# Patient Record
Sex: Male | Born: 1935 | Race: White | Hispanic: No | Smoking: Former smoker
Health system: Southern US, Community
[De-identification: ages and names within clinical notes are randomized; demographics above are authoritative.]

## PROBLEM LIST (undated history)

## (undated) DIAGNOSIS — F329 Major depressive disorder, single episode, unspecified: Secondary | ICD-10-CM

## (undated) DIAGNOSIS — I4719 Other supraventricular tachycardia: Secondary | ICD-10-CM

## (undated) DIAGNOSIS — I2510 Atherosclerotic heart disease of native coronary artery without angina pectoris: Secondary | ICD-10-CM

## (undated) DIAGNOSIS — K648 Other hemorrhoids: Secondary | ICD-10-CM

## (undated) DIAGNOSIS — E7800 Pure hypercholesterolemia, unspecified: Secondary | ICD-10-CM

## (undated) DIAGNOSIS — K529 Noninfective gastroenteritis and colitis, unspecified: Secondary | ICD-10-CM

## (undated) DIAGNOSIS — I471 Supraventricular tachycardia: Secondary | ICD-10-CM

## (undated) DIAGNOSIS — C61 Malignant neoplasm of prostate: Secondary | ICD-10-CM

## (undated) DIAGNOSIS — N183 Chronic kidney disease, stage 3 unspecified: Secondary | ICD-10-CM

## (undated) DIAGNOSIS — K219 Gastro-esophageal reflux disease without esophagitis: Secondary | ICD-10-CM

## (undated) DIAGNOSIS — I10 Essential (primary) hypertension: Secondary | ICD-10-CM

## (undated) DIAGNOSIS — F32A Depression, unspecified: Secondary | ICD-10-CM

## (undated) DIAGNOSIS — R2681 Unsteadiness on feet: Secondary | ICD-10-CM

## (undated) DIAGNOSIS — I4891 Unspecified atrial fibrillation: Secondary | ICD-10-CM

## (undated) DIAGNOSIS — M549 Dorsalgia, unspecified: Secondary | ICD-10-CM

## (undated) DIAGNOSIS — M1990 Unspecified osteoarthritis, unspecified site: Secondary | ICD-10-CM

## (undated) DIAGNOSIS — I219 Acute myocardial infarction, unspecified: Secondary | ICD-10-CM

## (undated) DIAGNOSIS — D126 Benign neoplasm of colon, unspecified: Secondary | ICD-10-CM

## (undated) DIAGNOSIS — K5730 Diverticulosis of large intestine without perforation or abscess without bleeding: Secondary | ICD-10-CM

## (undated) DIAGNOSIS — I509 Heart failure, unspecified: Secondary | ICD-10-CM

## (undated) DIAGNOSIS — R001 Bradycardia, unspecified: Secondary | ICD-10-CM

## (undated) HISTORY — PX: OTHER SURGICAL HISTORY: SHX169

## (undated) HISTORY — DX: Other hemorrhoids: K64.8

## (undated) HISTORY — PX: CARDIAC VALVE SURGERY: SHX40

## (undated) HISTORY — DX: Dorsalgia, unspecified: M54.9

## (undated) HISTORY — DX: Unspecified atrial fibrillation: I48.91

## (undated) HISTORY — DX: Malignant neoplasm of prostate: C61

## (undated) HISTORY — DX: Heart failure, unspecified: I50.9

## (undated) HISTORY — DX: Pure hypercholesterolemia, unspecified: E78.00

## (undated) HISTORY — DX: Bradycardia, unspecified: R00.1

## (undated) HISTORY — DX: Unspecified osteoarthritis, unspecified site: M19.90

## (undated) HISTORY — DX: Atherosclerotic heart disease of native coronary artery without angina pectoris: I25.10

## (undated) HISTORY — DX: Acute myocardial infarction, unspecified: I21.9

## (undated) HISTORY — DX: Diverticulosis of large intestine without perforation or abscess without bleeding: K57.30

## (undated) HISTORY — DX: Essential (primary) hypertension: I10

---

## 1988-09-23 HISTORY — PX: CORONARY ARTERY BYPASS GRAFT: SHX141

## 1992-01-22 HISTORY — PX: PROSTATE SURGERY: SHX751

## 1998-02-28 ENCOUNTER — Ambulatory Visit (HOSPITAL_BASED_OUTPATIENT_CLINIC_OR_DEPARTMENT_OTHER): Admission: RE | Admit: 1998-02-28 | Discharge: 1998-02-28 | Payer: Self-pay | Admitting: Orthopedic Surgery

## 1999-02-12 ENCOUNTER — Emergency Department (HOSPITAL_COMMUNITY): Admission: EM | Admit: 1999-02-12 | Discharge: 1999-02-12 | Payer: Self-pay | Admitting: Emergency Medicine

## 1999-05-15 ENCOUNTER — Encounter: Payer: Self-pay | Admitting: Emergency Medicine

## 1999-05-15 ENCOUNTER — Emergency Department (HOSPITAL_COMMUNITY): Admission: EM | Admit: 1999-05-15 | Discharge: 1999-05-15 | Payer: Self-pay | Admitting: Emergency Medicine

## 2000-07-21 ENCOUNTER — Emergency Department (HOSPITAL_COMMUNITY): Admission: EM | Admit: 2000-07-21 | Discharge: 2000-07-22 | Payer: Self-pay | Admitting: Emergency Medicine

## 2001-01-15 ENCOUNTER — Encounter: Payer: Self-pay | Admitting: Orthopedic Surgery

## 2001-01-15 ENCOUNTER — Encounter: Admission: RE | Admit: 2001-01-15 | Discharge: 2001-01-15 | Payer: Self-pay | Admitting: Orthopedic Surgery

## 2002-11-16 ENCOUNTER — Inpatient Hospital Stay (HOSPITAL_COMMUNITY): Admission: EM | Admit: 2002-11-16 | Discharge: 2002-11-18 | Payer: Self-pay | Admitting: Emergency Medicine

## 2002-11-16 ENCOUNTER — Encounter: Payer: Self-pay | Admitting: Emergency Medicine

## 2004-01-17 ENCOUNTER — Encounter: Admission: RE | Admit: 2004-01-17 | Discharge: 2004-01-17 | Payer: Self-pay | Admitting: Family Medicine

## 2004-02-13 ENCOUNTER — Ambulatory Visit (HOSPITAL_COMMUNITY): Admission: RE | Admit: 2004-02-13 | Discharge: 2004-02-13 | Payer: Self-pay | Admitting: Cardiology

## 2004-07-26 ENCOUNTER — Ambulatory Visit: Payer: Self-pay | Admitting: Cardiology

## 2005-01-09 ENCOUNTER — Ambulatory Visit: Payer: Self-pay | Admitting: Cardiology

## 2005-04-20 ENCOUNTER — Encounter: Admission: RE | Admit: 2005-04-20 | Discharge: 2005-04-20 | Payer: Self-pay | Admitting: Family Medicine

## 2005-05-01 ENCOUNTER — Encounter: Admission: RE | Admit: 2005-05-01 | Discharge: 2005-05-01 | Payer: Self-pay | Admitting: Family Medicine

## 2005-05-15 ENCOUNTER — Encounter: Admission: RE | Admit: 2005-05-15 | Discharge: 2005-05-15 | Payer: Self-pay | Admitting: Family Medicine

## 2005-08-12 ENCOUNTER — Ambulatory Visit: Payer: Self-pay | Admitting: Cardiology

## 2005-08-23 ENCOUNTER — Ambulatory Visit: Payer: Self-pay

## 2005-12-05 ENCOUNTER — Ambulatory Visit: Payer: Self-pay | Admitting: Cardiology

## 2006-05-29 ENCOUNTER — Ambulatory Visit: Payer: Self-pay | Admitting: Cardiology

## 2006-07-18 ENCOUNTER — Ambulatory Visit (HOSPITAL_COMMUNITY): Admission: RE | Admit: 2006-07-18 | Discharge: 2006-07-18 | Payer: Self-pay | Admitting: Urology

## 2006-08-04 ENCOUNTER — Ambulatory Visit: Payer: Self-pay | Admitting: Cardiology

## 2006-09-23 HISTORY — PX: CORONARY ANGIOPLASTY WITH STENT PLACEMENT: SHX49

## 2006-12-11 ENCOUNTER — Ambulatory Visit: Payer: Self-pay | Admitting: Cardiology

## 2007-02-09 ENCOUNTER — Encounter: Admission: RE | Admit: 2007-02-09 | Discharge: 2007-02-09 | Payer: Self-pay | Admitting: Family Medicine

## 2007-02-22 DIAGNOSIS — D126 Benign neoplasm of colon, unspecified: Secondary | ICD-10-CM

## 2007-02-22 HISTORY — DX: Benign neoplasm of colon, unspecified: D12.6

## 2007-02-23 ENCOUNTER — Ambulatory Visit: Payer: Self-pay | Admitting: Gastroenterology

## 2007-03-03 ENCOUNTER — Encounter: Payer: Self-pay | Admitting: Gastroenterology

## 2007-03-03 ENCOUNTER — Ambulatory Visit: Payer: Self-pay | Admitting: Gastroenterology

## 2007-03-03 LAB — CONVERTED CEMR LAB: BUN: 24 mg/dL — ABNORMAL HIGH (ref 6–23)

## 2007-03-04 ENCOUNTER — Encounter: Admission: RE | Admit: 2007-03-04 | Discharge: 2007-03-04 | Payer: Self-pay | Admitting: Gastroenterology

## 2007-03-23 ENCOUNTER — Encounter: Admission: RE | Admit: 2007-03-23 | Discharge: 2007-03-23 | Payer: Self-pay | Admitting: Family Medicine

## 2007-04-01 ENCOUNTER — Ambulatory Visit: Payer: Self-pay | Admitting: Gastroenterology

## 2007-05-26 ENCOUNTER — Ambulatory Visit: Payer: Self-pay | Admitting: Cardiology

## 2007-06-02 ENCOUNTER — Inpatient Hospital Stay (HOSPITAL_COMMUNITY): Admission: EM | Admit: 2007-06-02 | Discharge: 2007-06-12 | Payer: Self-pay | Admitting: Emergency Medicine

## 2007-06-02 ENCOUNTER — Ambulatory Visit: Payer: Self-pay | Admitting: Cardiology

## 2007-06-04 ENCOUNTER — Ambulatory Visit: Payer: Self-pay | Admitting: Cardiothoracic Surgery

## 2007-06-09 ENCOUNTER — Encounter: Payer: Self-pay | Admitting: Cardiology

## 2007-06-18 ENCOUNTER — Ambulatory Visit: Payer: Self-pay | Admitting: Cardiology

## 2007-06-18 LAB — CONVERTED CEMR LAB
CO2: 29 meq/L (ref 19–32)
Calcium: 9 mg/dL (ref 8.4–10.5)
Chloride: 102 meq/L (ref 96–112)
GFR calc Af Amer: 54 mL/min
Glucose, Bld: 84 mg/dL (ref 70–99)
Sodium: 138 meq/L (ref 135–145)

## 2007-07-02 ENCOUNTER — Encounter (HOSPITAL_COMMUNITY): Admission: RE | Admit: 2007-07-02 | Discharge: 2007-09-23 | Payer: Self-pay | Admitting: Cardiology

## 2007-07-06 ENCOUNTER — Inpatient Hospital Stay (HOSPITAL_COMMUNITY): Admission: EM | Admit: 2007-07-06 | Discharge: 2007-07-08 | Payer: Self-pay | Admitting: Emergency Medicine

## 2007-07-06 ENCOUNTER — Ambulatory Visit: Payer: Self-pay | Admitting: Cardiology

## 2007-07-13 ENCOUNTER — Ambulatory Visit: Payer: Self-pay | Admitting: Cardiology

## 2007-07-13 ENCOUNTER — Encounter: Payer: Self-pay | Admitting: Cardiology

## 2007-07-13 ENCOUNTER — Ambulatory Visit: Payer: Self-pay

## 2007-08-13 ENCOUNTER — Ambulatory Visit: Payer: Self-pay | Admitting: Cardiology

## 2007-08-26 ENCOUNTER — Ambulatory Visit: Payer: Self-pay | Admitting: Cardiology

## 2007-08-26 LAB — CONVERTED CEMR LAB
Chloride: 105 meq/L (ref 96–112)
Creatinine, Ser: 1.4 mg/dL (ref 0.4–1.5)
Glucose, Bld: 65 mg/dL — ABNORMAL LOW (ref 70–99)
Sodium: 142 meq/L (ref 135–145)

## 2007-09-24 ENCOUNTER — Encounter (HOSPITAL_COMMUNITY): Admission: RE | Admit: 2007-09-24 | Discharge: 2007-11-06 | Payer: Self-pay | Admitting: Cardiology

## 2007-09-25 ENCOUNTER — Ambulatory Visit: Payer: Self-pay | Admitting: Cardiology

## 2007-11-25 ENCOUNTER — Ambulatory Visit: Payer: Self-pay | Admitting: Cardiology

## 2008-01-12 ENCOUNTER — Ambulatory Visit: Payer: Self-pay | Admitting: Cardiology

## 2008-05-26 ENCOUNTER — Ambulatory Visit: Payer: Self-pay | Admitting: Cardiology

## 2008-06-09 ENCOUNTER — Ambulatory Visit: Payer: Self-pay | Admitting: Cardiology

## 2008-06-09 LAB — CONVERTED CEMR LAB
ALT: 17 units/L (ref 0–53)
AST: 23 units/L (ref 0–37)
Albumin: 3.6 g/dL (ref 3.5–5.2)
BUN: 18 mg/dL (ref 6–23)
Basophils Relative: 0.5 % (ref 0.0–3.0)
CO2: 29 meq/L (ref 19–32)
Chloride: 105 meq/L (ref 96–112)
Creatinine, Ser: 1.4 mg/dL (ref 0.4–1.5)
Eosinophils Relative: 4.3 % (ref 0.0–5.0)
Glucose, Bld: 122 mg/dL — ABNORMAL HIGH (ref 70–99)
LDL Cholesterol: 60 mg/dL (ref 0–99)
Lymphocytes Relative: 26.4 % (ref 12.0–46.0)
Magnesium: 2.4 mg/dL (ref 1.5–2.5)
Monocytes Relative: 7.2 % (ref 3.0–12.0)
Neutrophils Relative %: 61.6 % (ref 43.0–77.0)
RBC: 5.23 M/uL (ref 4.22–5.81)
Total Bilirubin: 0.9 mg/dL (ref 0.3–1.2)
VLDL: 19 mg/dL (ref 0–40)
WBC: 9 10*3/uL (ref 4.5–10.5)

## 2008-06-17 ENCOUNTER — Ambulatory Visit: Payer: Self-pay | Admitting: Cardiology

## 2008-06-17 LAB — CONVERTED CEMR LAB
CO2: 28 meq/L (ref 19–32)
Calcium: 8.6 mg/dL (ref 8.4–10.5)
Creatinine, Ser: 1.3 mg/dL (ref 0.4–1.5)

## 2008-08-10 IMAGING — CR DG CHEST 2V
2 series · 2 of 2 positions shown · non-contrast
Comparison: 06/06/07.

CLINICAL DATA: Acute myocardial infarction.  Chest pain and shortness of breath.
 CHEST - 2 VIEW:

[w chest pa]
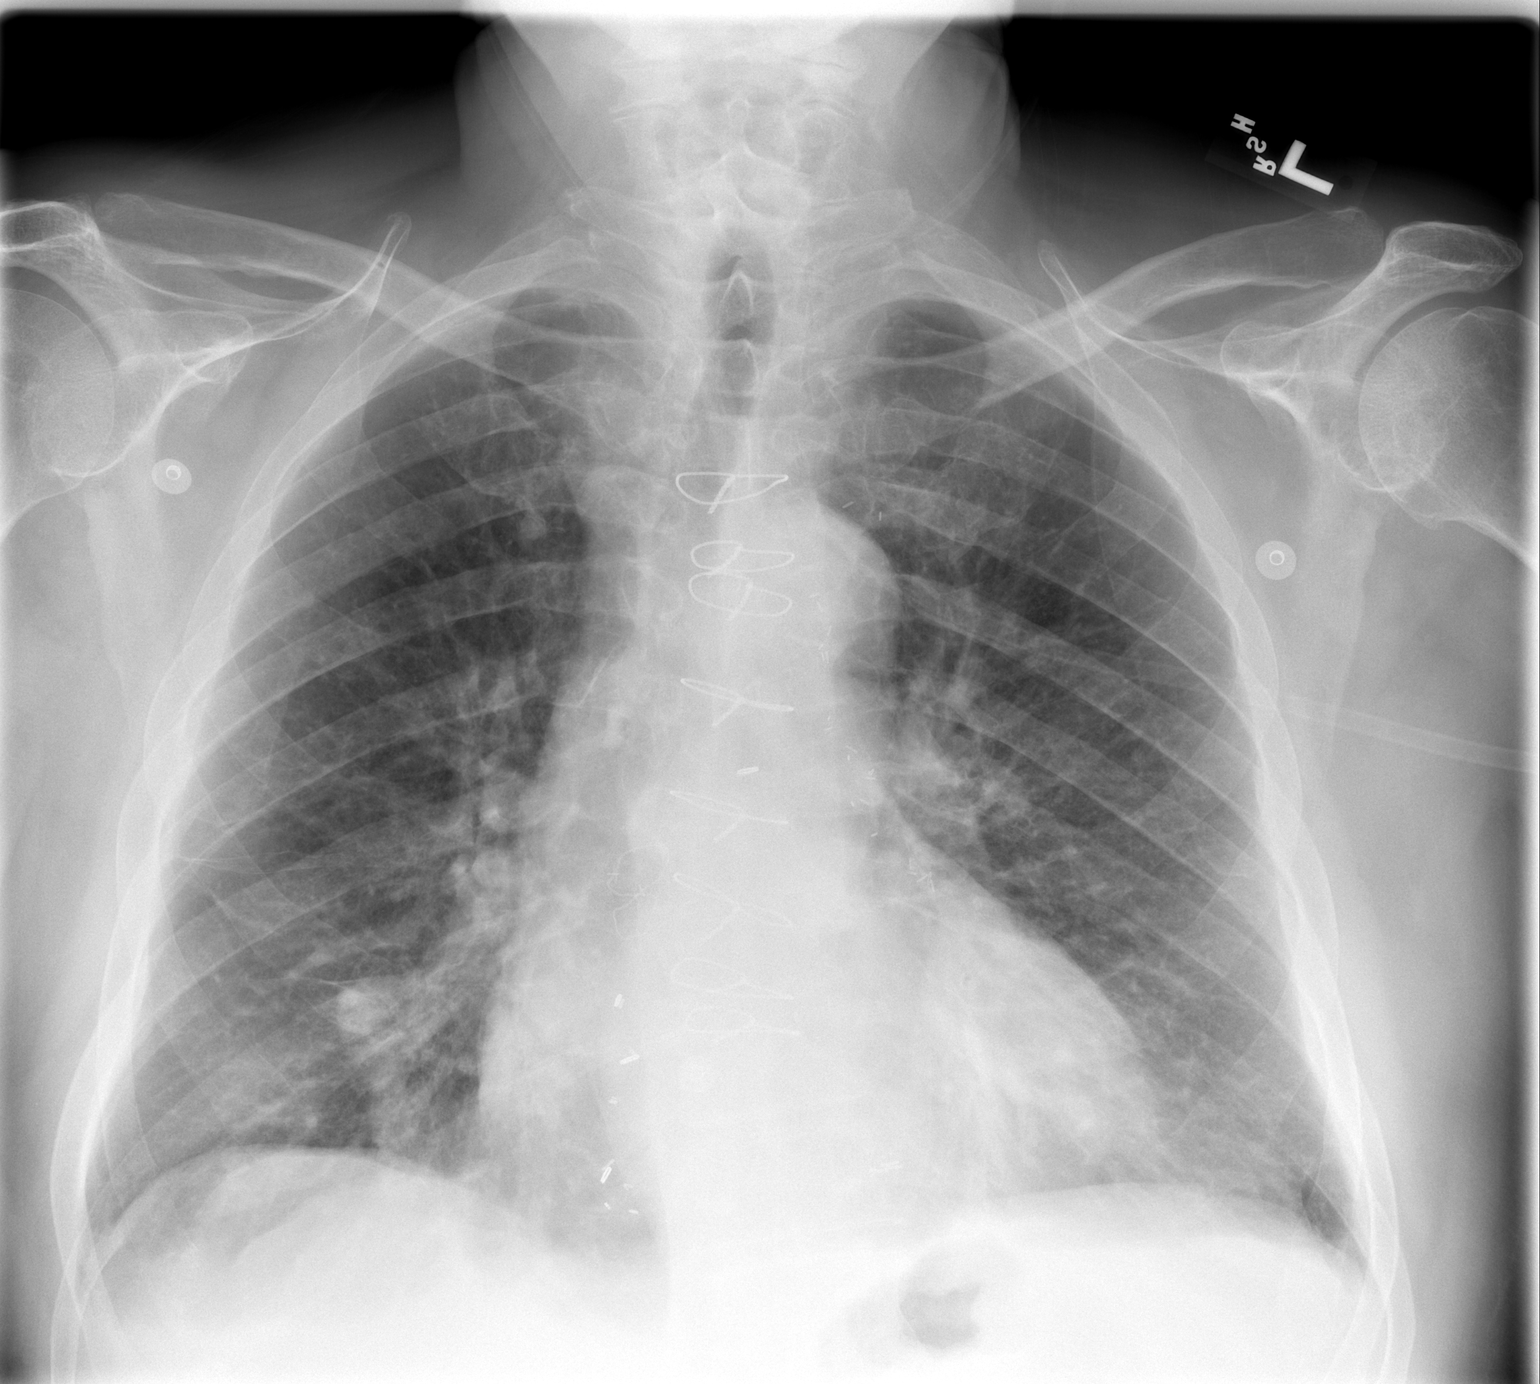

[w chest lat]
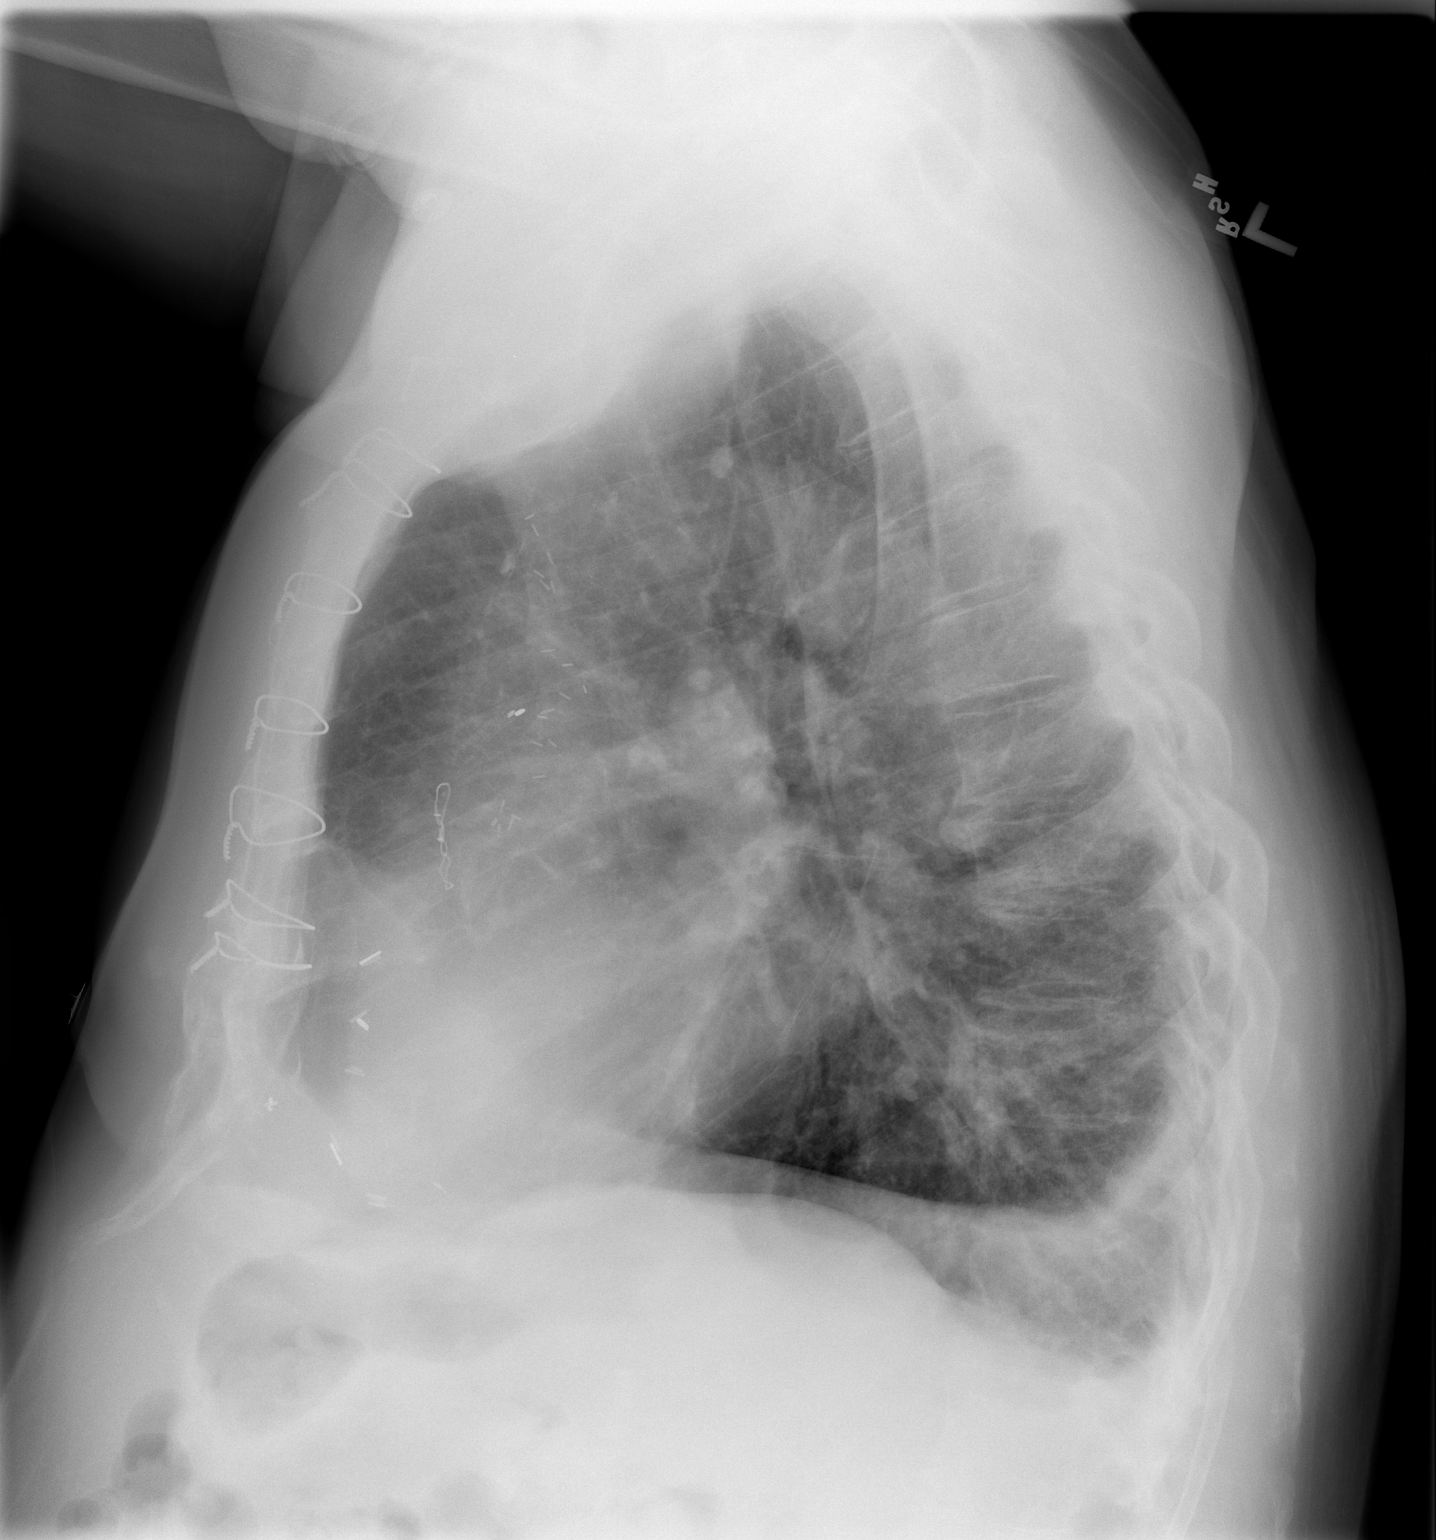

[2 of 2 positions shown; findings below may reference images not displayed]

FINDINGS: Tiny bilateral pleural effusions remain stable.  Mild cardiomegaly is stable as well as mild interstitial edema.  There is no evidence of pulmonary consolidation.  Patient has undergone a previous coronary artery bypass grafting.
IMPRESSION: Stable mild congestive heart failure and tiny bilateral pleural effusions.

## 2008-12-01 ENCOUNTER — Ambulatory Visit (HOSPITAL_BASED_OUTPATIENT_CLINIC_OR_DEPARTMENT_OTHER): Admission: RE | Admit: 2008-12-01 | Discharge: 2008-12-01 | Payer: Self-pay | Admitting: Orthopedic Surgery

## 2009-01-27 ENCOUNTER — Ambulatory Visit: Payer: Self-pay | Admitting: Cardiology

## 2009-01-27 DIAGNOSIS — E7800 Pure hypercholesterolemia, unspecified: Secondary | ICD-10-CM | POA: Insufficient documentation

## 2009-01-27 DIAGNOSIS — I10 Essential (primary) hypertension: Secondary | ICD-10-CM | POA: Insufficient documentation

## 2009-01-27 DIAGNOSIS — R609 Edema, unspecified: Secondary | ICD-10-CM

## 2009-01-31 ENCOUNTER — Ambulatory Visit: Payer: Self-pay

## 2009-02-01 ENCOUNTER — Encounter: Payer: Self-pay | Admitting: Cardiology

## 2009-05-28 ENCOUNTER — Ambulatory Visit: Payer: Self-pay | Admitting: Internal Medicine

## 2009-05-29 ENCOUNTER — Inpatient Hospital Stay (HOSPITAL_COMMUNITY): Admission: EM | Admit: 2009-05-29 | Discharge: 2009-06-04 | Payer: Self-pay | Admitting: Emergency Medicine

## 2009-05-31 ENCOUNTER — Encounter (INDEPENDENT_AMBULATORY_CARE_PROVIDER_SITE_OTHER): Payer: Self-pay | Admitting: Internal Medicine

## 2009-06-12 ENCOUNTER — Ambulatory Visit: Payer: Self-pay | Admitting: Cardiology

## 2009-06-16 ENCOUNTER — Ambulatory Visit: Payer: Self-pay | Admitting: Internal Medicine

## 2009-06-16 DIAGNOSIS — I498 Other specified cardiac arrhythmias: Secondary | ICD-10-CM | POA: Insufficient documentation

## 2009-06-16 DIAGNOSIS — I495 Sick sinus syndrome: Secondary | ICD-10-CM | POA: Insufficient documentation

## 2009-06-19 ENCOUNTER — Ambulatory Visit: Payer: Self-pay | Admitting: Cardiology

## 2009-06-19 LAB — CONVERTED CEMR LAB: POC INR: 3.5

## 2009-06-26 ENCOUNTER — Ambulatory Visit: Payer: Self-pay | Admitting: Internal Medicine

## 2009-06-29 ENCOUNTER — Telehealth: Payer: Self-pay | Admitting: Cardiology

## 2009-07-03 ENCOUNTER — Ambulatory Visit: Payer: Self-pay | Admitting: Internal Medicine

## 2009-07-13 ENCOUNTER — Ambulatory Visit: Payer: Self-pay | Admitting: Cardiovascular Disease

## 2009-07-13 LAB — CONVERTED CEMR LAB: INR: 2.4

## 2009-07-18 ENCOUNTER — Encounter (INDEPENDENT_AMBULATORY_CARE_PROVIDER_SITE_OTHER): Payer: Self-pay | Admitting: *Deleted

## 2009-07-21 ENCOUNTER — Ambulatory Visit: Payer: Self-pay | Admitting: Cardiology

## 2009-08-02 ENCOUNTER — Ambulatory Visit: Payer: Self-pay | Admitting: Cardiology

## 2009-08-07 LAB — CONVERTED CEMR LAB
CO2: 29 meq/L (ref 19–32)
Chloride: 102 meq/L (ref 96–112)
Potassium: 4 meq/L (ref 3.5–5.1)
Sodium: 139 meq/L (ref 135–145)

## 2009-08-10 ENCOUNTER — Ambulatory Visit: Payer: Self-pay | Admitting: Cardiology

## 2009-08-10 LAB — CONVERTED CEMR LAB: POC INR: 2.5

## 2009-09-08 ENCOUNTER — Ambulatory Visit: Payer: Self-pay | Admitting: Cardiovascular Disease

## 2009-09-08 LAB — CONVERTED CEMR LAB: POC INR: 4

## 2009-09-20 ENCOUNTER — Ambulatory Visit: Payer: Self-pay | Admitting: Cardiology

## 2009-09-20 LAB — CONVERTED CEMR LAB: POC INR: 3.1

## 2009-09-21 ENCOUNTER — Encounter: Payer: Self-pay | Admitting: Cardiology

## 2009-10-09 ENCOUNTER — Ambulatory Visit: Payer: Self-pay | Admitting: Cardiology

## 2009-10-09 DIAGNOSIS — I4891 Unspecified atrial fibrillation: Secondary | ICD-10-CM

## 2009-11-28 ENCOUNTER — Encounter (INDEPENDENT_AMBULATORY_CARE_PROVIDER_SITE_OTHER): Payer: Self-pay | Admitting: *Deleted

## 2009-12-07 DIAGNOSIS — N182 Chronic kidney disease, stage 2 (mild): Secondary | ICD-10-CM

## 2009-12-07 DIAGNOSIS — M549 Dorsalgia, unspecified: Secondary | ICD-10-CM | POA: Insufficient documentation

## 2009-12-07 DIAGNOSIS — E119 Type 2 diabetes mellitus without complications: Secondary | ICD-10-CM

## 2009-12-07 DIAGNOSIS — Z8546 Personal history of malignant neoplasm of prostate: Secondary | ICD-10-CM | POA: Insufficient documentation

## 2009-12-12 ENCOUNTER — Ambulatory Visit: Payer: Self-pay | Admitting: Cardiology

## 2010-03-05 ENCOUNTER — Telehealth: Payer: Self-pay | Admitting: Cardiology

## 2010-03-19 ENCOUNTER — Ambulatory Visit: Payer: Self-pay | Admitting: Cardiology

## 2010-04-10 ENCOUNTER — Ambulatory Visit (HOSPITAL_COMMUNITY): Admission: RE | Admit: 2010-04-10 | Discharge: 2010-04-10 | Payer: Self-pay | Admitting: Urology

## 2010-04-16 ENCOUNTER — Encounter: Payer: Self-pay | Admitting: Cardiology

## 2010-05-03 ENCOUNTER — Encounter: Payer: Self-pay | Admitting: Cardiology

## 2010-05-24 ENCOUNTER — Encounter: Admission: RE | Admit: 2010-05-24 | Discharge: 2010-05-24 | Payer: Self-pay | Admitting: Neurosurgery

## 2010-05-29 ENCOUNTER — Ambulatory Visit: Payer: Self-pay | Admitting: Internal Medicine

## 2010-06-04 ENCOUNTER — Telehealth: Payer: Self-pay | Admitting: Cardiology

## 2010-06-20 ENCOUNTER — Ambulatory Visit: Payer: Self-pay | Admitting: Cardiology

## 2010-06-26 LAB — CONVERTED CEMR LAB
ALT: 15 units/L (ref 0–53)
AST: 20 units/L (ref 0–37)
Alkaline Phosphatase: 61 units/L (ref 39–117)
BUN: 18 mg/dL (ref 6–23)
Bilirubin, Direct: 0.2 mg/dL (ref 0.0–0.3)
Chloride: 104 meq/L (ref 96–112)
GFR calc non Af Amer: 59.54 mL/min (ref >60)
Potassium: 4.1 meq/L (ref 3.5–5.1)
Sodium: 137 meq/L (ref 135–145)
Total Bilirubin: 0.8 mg/dL (ref 0.3–1.2)

## 2010-10-18 ENCOUNTER — Inpatient Hospital Stay (HOSPITAL_COMMUNITY)
Admission: EM | Admit: 2010-10-18 | Discharge: 2010-10-23 | Disposition: A | Discharge status: Home / Self Care | Payer: Self-pay | Source: Home / Self Care | Attending: Cardiology | Admitting: Cardiology

## 2010-10-18 ENCOUNTER — Ambulatory Visit: Admit: 2010-10-18 | Payer: Self-pay | Admitting: Physician Assistant

## 2010-10-18 LAB — CBC
HCT: 41.8 % (ref 39.0–52.0)
MCHC: 33.5 g/dL (ref 30.0–36.0)
MCV: 84.3 fL (ref 78.0–100.0)
Platelets: 215 10*3/uL (ref 150–400)
RBC: 4.96 MIL/uL (ref 4.22–5.81)
RDW: 13.7 % (ref 11.5–15.5)
WBC: 12.1 10*3/uL — ABNORMAL HIGH (ref 4.0–10.5)

## 2010-10-18 LAB — LIPID PANEL
HDL: 33 mg/dL — ABNORMAL LOW (ref >39)
Triglycerides: 76 mg/dL (ref <150)
VLDL: 15 mg/dL (ref 0–40)

## 2010-10-18 LAB — BASIC METABOLIC PANEL
BUN: 16 mg/dL (ref 6–23)
CO2: 23 mEq/L (ref 19–32)
GFR calc non Af Amer: 54 mL/min — ABNORMAL LOW (ref >60)

## 2010-10-18 LAB — CK TOTAL AND CKMB (NOT AT ARMC)
CK, MB: 2 ng/mL (ref 0.3–4.0)
Relative Index: INVALID (ref 0.0–2.5)
Total CK: 57 U/L (ref 7–232)

## 2010-10-18 LAB — MRSA PCR SCREENING: MRSA by PCR: NEGATIVE

## 2010-10-18 LAB — HEPATIC FUNCTION PANEL
ALT: 16 U/L (ref 0–53)
AST: 20 U/L (ref 0–37)
Alkaline Phosphatase: 48 U/L (ref 39–117)
Bilirubin, Direct: 0.2 mg/dL (ref 0.0–0.3)
Indirect Bilirubin: 0.5 mg/dL (ref 0.3–0.9)
Total Bilirubin: 0.7 mg/dL (ref 0.3–1.2)

## 2010-10-18 LAB — POCT CARDIAC MARKERS

## 2010-10-18 LAB — DIFFERENTIAL
Basophils Absolute: 0 10*3/uL (ref 0.0–0.1)
Eosinophils Absolute: 0.1 10*3/uL (ref 0.0–0.7)
Monocytes Relative: 4 % (ref 3–12)

## 2010-10-18 LAB — TROPONIN I: Troponin I: 0.04 ng/mL (ref 0.00–0.06)

## 2010-10-18 LAB — CARDIAC PANEL(CRET KIN+CKTOT+MB+TROPI): Troponin I: 0.01 ng/mL (ref 0.00–0.06)

## 2010-10-18 LAB — GLUCOSE, CAPILLARY: Glucose-Capillary: 220 mg/dL — ABNORMAL HIGH (ref 70–99)

## 2010-10-19 LAB — BASIC METABOLIC PANEL
CO2: 26 mEq/L (ref 19–32)
Chloride: 103 mEq/L (ref 96–112)
Creatinine, Ser: 1.41 mg/dL (ref 0.4–1.5)
GFR calc Af Amer: 59 mL/min — ABNORMAL LOW (ref >60)
Glucose, Bld: 185 mg/dL — ABNORMAL HIGH (ref 70–99)

## 2010-10-19 LAB — GLUCOSE, CAPILLARY
Glucose-Capillary: 202 mg/dL — ABNORMAL HIGH (ref 70–99)
Glucose-Capillary: 225 mg/dL — ABNORMAL HIGH (ref 70–99)
Glucose-Capillary: 164 mg/dL — ABNORMAL HIGH (ref 70–99)

## 2010-10-19 LAB — CBC
HCT: 39.4 % (ref 39.0–52.0)
Hemoglobin: 12.6 g/dL — ABNORMAL LOW (ref 13.0–17.0)
MCH: 27.1 pg (ref 26.0–34.0)
MCHC: 32 g/dL (ref 30.0–36.0)
MCV: 84.7 fL (ref 78.0–100.0)
RBC: 4.65 MIL/uL (ref 4.22–5.81)

## 2010-10-19 NOTE — H&P (Addendum)
Shawn [name], [name] NO.:  [id number]  MEDICAL RECORD NO.:  [id number]          PATIENT TYPE:  INP  LOCATION:  2923                         FACILITY:  [facility]  PHYSICIAN:  Shawn Morton. Riley Kill, MD, FACCDATE OF BIRTH:  Mar 31, 1928  DATE OF ADMISSION:  10/18/2010 DATE OF DISCHARGE:                             HISTORY & PHYSICAL   PRIMARY CARDIOLOGIST:  Shawn Morton. Riley Kill, MD, [facility]  PRIMARY MEDICAL DOCTOR:  Shawn Paradise, MD  CHIEF COMPLAINT:  Shortness of breath, "especially when I lie down."  HISTORY OF PRESENT ILLNESS:  Ms. Shawn [name] is an 77 year old gentleman with a history of coronary artery disease status post CABG, diabetes, hypertension, and AFib, on Pradaxa and Tikosyn who was admitted with shortness of breath.  For approximately over the past 10 days, he has had progressive shortness of breath and especially over the last few nights has had orthopnea and PND requiring him to sleep sitting up.  He has had no change in urine output.  His weight may be up, although he does not measure every day, approximately 2-4 pounds.  He had a sensation of belly may be a little bit bigger.  His appetite is poor to begin with.  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  He describes a similar type of episode when he was first diagnosed with atrial fibrillation. His initial EKG showed possible tolerated junctional rhythm with a rate of 80, but since being in the ER his rate has increased to 120s, atrial fibrillation.  He denies current shortness of breath, "just feels."  BNP is elevated at 114.  His blood pressure is stable in the 130s.  He endorses taking his Pradaxa and Tikosyn regularly as prescribed.  PAST MEDICAL HISTORY: 1. CAD.     a.     Status post CABG in 1990 with LIMA to the LAD, SVG to the OM-      1, and SVG to the RCA.     b.     STEMI in 2008 with VFib arrest and subsequent stenting to      the  PDA at that time, 3 grafts patent by cath.  This was his last      catheterization. 2. Diabetes. 3. Hypertension. 4. Chronic kidney disease with a creatinine clearance of 46. 5. Chronic back pain. 6. Ischemic cardiomyopathy with previous EF of 40-45%.  His last echo     was done in September 2010 and showed an EF of 60-65% with no wall     motion abnormalities and moderate LVH as well as mild MR.  However,     this was a technically difficult study. 7. Hyperlipidemia. 8. Atrial tachycardia/atrial fibrillation, on Pradaxa and Tikosyn.     His tachy arrhythmias have been followed by Dr. Graciela [name]. 9. Prostate cancer.  OUTPATIENT MEDICATIONS: 1. Glipizide 10 mg daily. 2. Potassium chloride 10 mEq daily. 3. Crestor 10 mg at bedtime. 4. Tikosyn 0.25 mg b.i.d. 5. Lasix 20 mg daily. 6. Lopressor 25 mg 1/2 tablet b.i.d. 7. Imdur 30 mg daily. 8. Amlodipine  10 mg daily. 9. Finasteride 5 mg daily. 10.Pradaxa 150 mg b.i.d. 11.Prednisone 5 mg daily. 12.Aspirin 81 mg daily. 13.Multivitamins 1 tablet daily.  ALLERGIES:  No known drug allergies.  SOCIAL HISTORY:  [name] lives in [place] with his son.  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  He has four children and nine grandchildren.  He has a remote history of tobacco abuse symptomatically and alcohol use.  FAMILY HISTORY:  His mother died at 68 of old age.  His father died at age 1 of some kind of  fever in the 65.  REVIEW OF SYSTEM:  No fevers or chills.  Positive for orthopnea, PND, and chronic lower extremity edema, slightly worse than usual.  No change in urination, frequency.  No hematuria, bright red blood per rectum, melena, or hematemesis.  All other systems reviewed and otherwise negative except for those noted in HPI.  LABORATORY DATA:  WBC 12.1, hemoglobin 14, hematocrit 41.8, platelet count 315.  Sodium 130, potassium 4.1, chloride 102,  CO2 of 23, glucose 253, BUN 16, creatinine 1.29.  D-dimer is negative.  Cardiac enzymes negative x2.  STUDIES:  Chest x-ray.  No acute cardiopulmonary abnormality.  PHYSICAL EXAMINATION:  VITAL SIGNS:  Temperature 97.6, pulse 125, respirations 20, blood pressure 132/83, pulse ox 97% on room air. GENERAL:  This is a pleasant white male in no acute distress. HEENT:  Normocephalic, atraumatic with extraocular movements intact. Clear sclerae.  Nares without discharge. NECK:  Supple without carotid bruits. HEART:  Auscultation of the heart reveals irregularly irregular rhythm with a fast rate.  S1 and S2 audible without murmurs, rubs, or gallops. LUNGS:  Bibasilar faint crackles, right greater than left. The patient has since put out about 700 mL of urine and when Dr. Riley [name] was in to see him, his lungs were and relatively clear. ABDOMEN:  Soft, nontender, nondistended.  Positive bowel sounds. EXTREMITIES:  Warm, dry with 1+ lower extremity edema on the right and 2+ lower extremity edema on the left. NEUROLOGIC:  He is alert and oriented x3.  Responds to questions appropriately with a normal affect.ASSESSMENT/PLAN:  The patient was seen and examined by myself and Dr. Riley [name].  This is an 77 year old gentleman with history of coronary artery disease, diabetes, hypertension, congestive heart failure, and atrial fibrillation who is admitted with shortness of breath x10 days gradually.  He appears to be in atrial fibrillation with rapid ventricular response at this time and does not seem to be tolerating this well in terms of his volume and symptom status.  His initial EKG showed a possible junctional rhythm in the 80s, but now is in atrial fibrillation with rates ranging from 110-130 with a blood pressure in the 130s range.  He has put out quite a bit of urine with 40 mg of IV Lasix started in the ER.  At this time, we will admit the patient to the hospital and initiate IV Lasix diuresis.   He is on 20 mg p.o. daily at home, therefore, will be started on 20 mg IV b.i.d. here in the hospital.  His Tikosyn will be continued as discussed between Dr. Shirlee [name] and Dr. Riley [name].  He will be started on diltiazem bolus at 10 mg IV x1 then a 5 mg per hour drip.  His amlodipine will be held secondary to initiation of diltiazem.  His Lopressor will be continued, but careful attention will be paid to the  fact that the patient has a history of bradycardia with a rate lowering agents and therefore, will need to be monitored.  If he has issues with bradycardia once his tachycardia is under control, he may need consideration for pacemaker.  This was briefly discussed with the patient.  Dr. Riley [name] will also notify Dr. Graciela [name] his findings as well.     Dayna Dunn, P.A.C.   ______________________________ Shawn Morton Riley Kill, MD, [facility]    DD/MEDQ  D:  10/18/2010  T:  10/19/2010  Job:  [id number]  cc:   Shawn Morton. Riley Kill, MD, [facility] Shawn [name], M.D.  Electronically Signed by Ronie Spies  on 11/05/2010 09:43:11 PM Electronically Signed by Shawnie Pons MD [facility] on 11/14/2010 09:09:29 PM

## 2010-10-20 LAB — GLUCOSE, CAPILLARY: Glucose-Capillary: 104 mg/dL — ABNORMAL HIGH (ref 70–99)

## 2010-10-21 LAB — CONVERTED CEMR LAB
Albumin: 3.9 g/dL (ref 3.5–5.2)
Alkaline Phosphatase: 61 units/L (ref 39–117)
BUN: 15 mg/dL (ref 6–23)
BUN: 15 mg/dL (ref 6–23)
Basophils Absolute: 0.1 10*3/uL (ref 0.0–0.1)
Bilirubin, Direct: 0.3 mg/dL (ref 0.0–0.3)
CO2: 29 meq/L (ref 19–32)
Calcium: 8.8 mg/dL (ref 8.4–10.5)
Calcium: 9 mg/dL (ref 8.4–10.5)
Chloride: 104 meq/L (ref 96–112)
Chloride: 106 meq/L (ref 96–112)
Creatinine, Ser: 1.3 mg/dL (ref 0.4–1.5)
Eosinophils Absolute: 0.4 10*3/uL (ref 0.0–0.7)
GFR calc non Af Amer: 56.58 mL/min (ref >60)
Glucose, Bld: 129 mg/dL — ABNORMAL HIGH (ref 70–99)
Glucose, Bld: 62 mg/dL — ABNORMAL LOW (ref 70–99)
Glucose, Bld: 97 mg/dL (ref 70–99)
Hemoglobin: 15.8 g/dL (ref 13.0–17.0)
Lymphocytes Relative: 28 % (ref 12.0–46.0)
MCHC: 34.3 g/dL (ref 30.0–36.0)
MCV: 88.8 fL (ref 78.0–100.0)
Magnesium: 2.2 mg/dL (ref 1.5–2.5)
Monocytes Absolute: 0.6 10*3/uL (ref 0.1–1.0)
Neutro Abs: 6.6 10*3/uL (ref 1.4–7.7)
Neutrophils Relative %: 62.5 % (ref 43.0–77.0)
Potassium: 4.2 meq/L (ref 3.5–5.1)
RDW: 14.4 % (ref 11.5–14.6)
Sodium: 143 meq/L (ref 135–145)
Total Bilirubin: 1.2 mg/dL (ref 0.3–1.2)
Total Protein: 7.4 g/dL (ref 6.0–8.3)

## 2010-10-21 LAB — BASIC METABOLIC PANEL
GFR calc non Af Amer: 44 mL/min — ABNORMAL LOW (ref >60)
Potassium: 4 mEq/L (ref 3.5–5.1)
Sodium: 137 mEq/L (ref 135–145)

## 2010-10-21 LAB — GLUCOSE, CAPILLARY
Glucose-Capillary: 89 mg/dL (ref 70–99)
Glucose-Capillary: 179 mg/dL — ABNORMAL HIGH (ref 70–99)

## 2010-10-22 LAB — BASIC METABOLIC PANEL
CO2: 29 mEq/L (ref 19–32)
GFR calc non Af Amer: 45 mL/min — ABNORMAL LOW (ref >60)
Glucose, Bld: 85 mg/dL (ref 70–99)
Potassium: 4.7 mEq/L (ref 3.5–5.1)
Sodium: 138 mEq/L (ref 135–145)

## 2010-10-22 LAB — GLUCOSE, CAPILLARY
Glucose-Capillary: 64 mg/dL — ABNORMAL LOW (ref 70–99)
Glucose-Capillary: 82 mg/dL (ref 70–99)
Glucose-Capillary: 207 mg/dL — ABNORMAL HIGH (ref 70–99)

## 2010-10-23 LAB — BASIC METABOLIC PANEL
BUN: 17 mg/dL (ref 6–23)
CO2: 28 mEq/L (ref 19–32)
Calcium: 8.7 mg/dL (ref 8.4–10.5)
Creatinine, Ser: 1.25 mg/dL (ref 0.4–1.5)
GFR calc Af Amer: >60 mL/min (ref >60)
Glucose, Bld: 64 mg/dL — ABNORMAL LOW (ref 70–99)

## 2010-10-23 LAB — GLUCOSE, CAPILLARY

## 2010-10-23 NOTE — Progress Notes (Signed)
  Phone Note Outgoing Call   Call placed by: [name]. Stuckey Call placed to: Trey Sailors MD Summary of Call: Patient needs back steroid injections.  He wants to get back steroid injections.  Got an MR, and has severe stenosis.  Wants injection.  Recommended discontinuation of at least one week.  We discussed this.

## 2010-10-23 NOTE — Progress Notes (Signed)
Summary: REFLUX from Pradaxa  Phone Note Call from Patient Call back at Work Phone [phone]   Caller: Patient Reason for Call: Talk to Nurse Summary of Call: pt states he is having bad ingestion thinks its coming from the pradaxa. no chest pain, no SOB, no numbness. Dr Lanell Matar ofc called and stated pt needed to be seen today for chest pain. but when i called pt at his ofc he stated he was not having chest pain.  Initial call taken by: Edman Circle,  March 05, 2010 10:34 AM  Follow-up for Phone Call        I spoke with the pt and he denies Chest Pain.  The pt said he is having horrible indigestion and he thinks it is related to Pradaxa.  I made the pt aware that reflux is a side effect of Pradaxa.  The pt said he ate spaghetti last night and this made his reflux worse.  The pt is scheduled to travel to [facility] tomorrow and return on 03/10/10.  I spoke with Weston Brass PharmD and we recommended having the pt try Prilosec OTC for reflux.  The pt will start Prilosec and contact the office if his symptoms become worse.  I told the pt that he may have to switch back to Coumadin if his reflux cannot be managed.  The pt does not want to go back to Coumadin at this time.  The pt is scheduled to see Dr Riley Kill on  03/19/10.    Follow-up by: Julieta Gutting, RN, BSN,  March 05, 2010 11:18 AM     Appended Document: REFLUX from Pradaxa reviewed with patient at office visit in detail.  TS

## 2010-10-23 NOTE — Assessment & Plan Note (Signed)
Summary: ROV   Visit Type:  Follow-up Primary Provider:  Jacky Kindle   History of Present Illness: Tolerating new medication without difficulty.  He is now not having to come to the lab.  BP has been higher today.  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  Lots of stress.  Has not been checking at home.  It has been a little bit on the high.    Current Medications (verified): 1)  Furosemide 20 Mg Tabs (Furosemide) .... Take 1 By Mouth Once Daily 2)  Imdur 30 Mg Xr24h-Tab (Isosorbide Mononitrate) .... Take 1 By Mouth Once Daily 3)  Nitroglycerin 0.4 Mg Subl (Nitroglycerin) .... Place 1 Tablet Under Tongue As Directed 4)  Potassium Chloride Crys Cr 10 Meq Cr-Tabs (Potassium Chloride Crys Cr) .... Take 1 By Mouth Once Daily 5)  Crestor 10 Mg Tabs (Rosuvastatin Calcium) .... Take One Tablet By Mouth Daily. 6)  Tikosyn 250 Mcg Caps (Dofetilide) .... Take One Tablet By Mouth Twice Daily. 7)  Aspir-81 81 Mg Tbec (Aspirin) .Marland Kitchen.. 1 Tablet Every Other Day 8)  Daily Multiple Vitamins  Tabs (Multiple Vitamin) .Marland Kitchen.. 1 Tablet Every Day 9)  Pradaxa 150mg  .... Take One Tablet By Mouth Twice A Day 10)  Metoprolol Tartrate 25 Mg Tabs (Metoprolol Tartrate) .... Take 1/2 Tablet By Mouth Twice A Day 11)  Glipizide 10 Mg Xr24h-Tab (Glipizide) .... Take 1 Tablet By Mouth Daily 12)  Amlodipine Besylate 5 Mg Tabs (Amlodipine Besylate) .... Take One and One-Half Tablet By Mouth Daily  Allergies (verified): No Known Drug Allergies  Past History:  Past Medical History: Last updated: 12/22/2008 PAST MEDICAL HISTORY:   1. Type 2 diabetes diagnosed approximately 15 years ago.  Sugars at       home run in the 90s, and he is not sure what the upper limit is.       2.  He also has a 15-year history of hypertension.   2. History of hyperlipidemia.   3. Chronic kidney disease, stage III.  Baseline creatinine is unclear;       however, at the time of office followup on the 25th, his creatinine       was 1.6.   4. Chronic back pain  with injections.   5. Prostate cancer with surgery.   6. Small-bowel obstruction in 2004.   7. History of coronary artery disease with a three-vessel bypass graft       in 1990 with a LIMA to the LAD, saphenous vein graft to the OM, and       saphenous vein graft to the RCA.  On June 02, 2007, he was       hospitalized with a ST elevated myocardial infarction associated       with a V fib arrest requiring 2 shocks and CPR.  It was noted that       he had discontinued his aspirin and Plavix approximately 5 days       prior to the event secondary to pending back injection.  Emergent       cath was performed at that time and showed native three-vessel       coronary artery disease, patent LIMA to the LAD with distal small       vessel disease, 50%/total saphenous vein graft to the circumflex.       At that time, he received a bare metal stent.  He also was noted to       have a 95% saphenous vein graft to the  RCA and a 90% PLA.  He had a       staged metal stent to the saphenous vein graft to the RCA.  Last       echocardiogram on June 09, 2007 showed an EF of approximately       45%.  It was noted to be technically limited.  Inferior posterior       hypokinesis. The RV was not well- visualized.      Vital Signs:  Patient profile:   77 year old male Height:      71 inches Weight:      222 pounds BMI:     31.07 Pulse rate:   66 / minute BP sitting:   170 / 70  (left arm)  Vitals Entered By: Laurance Flatten CMA (October 09, 2009 3:05 PM)  Physical Exam  General:  Well developed, well nourished, in no acute distress. Head:  normocephalic and atraumatic Eyes:  PERRLA/EOM intact; conjunctiva and lids normal. Lungs:  Clear bilaterally to auscultation and percussion. Heart:  PMI non displaced.  Normal S1 and S2.  Pos S4.  No murmur. Extremities:  No clubbing or cyanosis.   EKG  Procedure date:  10/09/2009  Findings:      NSR.  QTC . Measured.  Impression &  Recommendations:  Problem # 1:  HYPERTENSION, BENIGN (ICD-401.1) Not very good control.  will increase Amlodipine to 10mg  daily. The following medications were removed from the medication list:    Amlodipine Besylate 5 Mg Tabs (Amlodipine besylate) .Marland Kitchen... Take 1 1/2 tablets by mouth daily His updated medication list for this problem includes:    Furosemide 20 Mg Tabs (Furosemide) .Marland Kitchen... Take 1 by mouth once daily    Aspir-81 81 Mg Tbec (Aspirin) .Marland Kitchen... 1 tablet every other day    Metoprolol Tartrate 25 Mg Tabs (Metoprolol tartrate) .Marland Kitchen... Take 1/2 tablet by mouth twice a day    Amlodipine Besylate 10 Mg Tabs (Amlodipine besylate) .Marland Kitchen... Take one tablet by mouth daily  Orders: EKG w/ Interpretation (93000) TLB-Hepatic/Liver Function Pnl (80076-HEPATIC) TLB-BMP (Basic Metabolic Panel-BMET) (80048-METABOL)  Problem # 2:  CAD,   BYPASS GRAFT EF 40-45% (ICD-414.02)  stable The following medications were removed from the medication list:    Amlodipine Besylate 5 Mg Tabs (Amlodipine besylate) .Marland Kitchen... Take 1 1/2 tablets by mouth daily His updated medication list for this problem includes:    Imdur 30 Mg Xr24h-tab (Isosorbide mononitrate) .Marland Kitchen... Take 1 by mouth once daily    Nitroglycerin 0.4 Mg Subl (Nitroglycerin) .Marland Kitchen... Place 1 tablet under tongue as directed    Aspir-81 81 Mg Tbec (Aspirin) .Marland Kitchen... 1 tablet every other day    Metoprolol Tartrate 25 Mg Tabs (Metoprolol tartrate) .Marland Kitchen... Take 1/2 tablet by mouth twice a day    Amlodipine Besylate 10 Mg Tabs (Amlodipine besylate) .Marland Kitchen... Take one tablet by mouth daily  Orders: EKG w/ Interpretation (93000) TLB-Hepatic/Liver Function Pnl (80076-HEPATIC) TLB-BMP (Basic Metabolic Panel-BMET) (80048-METABOL)  The following medications were removed from the medication list:    Amlodipine Besylate 5 Mg Tabs (Amlodipine besylate) .Marland Kitchen... Take 1 1/2 tablets by mouth daily His updated medication list for this problem includes:    Imdur 30 Mg Xr24h-tab (Isosorbide  mononitrate) .Marland Kitchen... Take 1 by mouth once daily    Nitroglycerin 0.4 Mg Subl (Nitroglycerin) .Marland Kitchen... Place 1 tablet under tongue as directed    Aspir-81 81 Mg Tbec (Aspirin) .Marland Kitchen... 1 tablet every other day    Metoprolol Tartrate 25 Mg Tabs (Metoprolol tartrate) .Marland Kitchen... Take  1/2 tablet by mouth twice a day  Problem # 3:  ATRIAL FIBRILLATION (ICD-427.31)  No recurrence on Tikosyn, low dose.  Have switch off of warfarin.  Check LFT His updated medication list for this problem includes:    Tikosyn 250 Mcg Caps (Dofetilide) .Marland Kitchen... Take one tablet by mouth twice daily.    Aspir-81 81 Mg Tbec (Aspirin) .Marland Kitchen... 1 tablet every other day    Metoprolol Tartrate 25 Mg Tabs (Metoprolol tartrate) .Marland Kitchen... Take 1/2 tablet by mouth twice a day  Orders: TLB-Hepatic/Liver Function Pnl (80076-HEPATIC) TLB-BMP (Basic Metabolic Panel-BMET) (80048-METABOL)  Patient Instructions: 1)  Your physician recommends that you have lab work today: LIVER and BMP 2)  Your physician has recommended you make the following change in your medication: INCREASE Amlodipine to 10mg  once a day 3)  Your physician recommends that you schedule a follow-up appointment in: 2 MONTHS Prescriptions: METOPROLOL TARTRATE 25 MG TABS (METOPROLOL TARTRATE) Take 1/2 tablet by mouth twice a day  #90 x 3   Entered by:   Julieta Gutting, RN, BSN   Authorized by:   Ronaldo Miyamoto, MD, [facility]   Signed by:   Julieta Gutting, RN, BSN on 10/09/2009   Method used:   Electronically to        [facility] [address] [id number] (retail)       [address]       [place], [place]  [address]       Ph: [phone] or [phone]       Fax: [phone]   RxID:   [id number] TIKOSYN 250 MCG CAPS (DOFETILIDE) Take one tablet by mouth twice daily.  #180 x 3   Entered by:   Julieta Gutting, RN, BSN   Authorized by:   Ronaldo Miyamoto, MD, [facility]   Signed by:   Julieta Gutting, RN, BSN on 10/09/2009   Method used:   Electronically to        [facility] [address] [id number] (retail)       [address]       [place], [place]  [address]       Ph: [phone] or [phone]       Fax: [phone]   RxID:   [id number] CRESTOR 10 MG TABS (ROSUVASTATIN CALCIUM) Take one tablet by mouth daily.  #90 x 3   Entered by:   Julieta Gutting, RN, BSN   Authorized by:   Ronaldo Miyamoto, MD, [facility]   Signed by:   Julieta Gutting, RN, BSN on 10/09/2009   Method used:   Electronically to        [facility] [address] [id number]* (retail)       [address]       [place], [place]  [address]       Ph: [phone] or [phone]       Fax: [phone]   RxID:   [id number] POTASSIUM CHLORIDE CRYS CR 10 MEQ CR-TABS (POTASSIUM CHLORIDE CRYS CR) Take 1 by mouth once daily  #90 x 3   Entered by:   Julieta Gutting, RN, BSN   Authorized by:   Ronaldo Miyamoto, MD, [facility]   Signed by:   Julieta Gutting, RN, BSN on 10/09/2009   Method used:   Electronically to        [facility] [address]  [id number]* (retail)       [address]       [place], [place]  [address]       Ph: [phone] or [phone]       Fax: [phone]   RxID:   [id number] IMDUR 30 MG XR24H-TAB (ISOSORBIDE MONONITRATE) Take 1 by mouth once daily  #90 x 3   Entered by:   Julieta Gutting, RN, BSN   Authorized by:   Ronaldo Miyamoto, MD, [facility]   Signed by:   Julieta Gutting, RN, BSN on 10/09/2009   Method used:   Electronically to        [facility] [address] [id number] (retail)       [address]       [place], [place]  [address]       Ph: [phone] or [phone]       Fax: [phone]   RxID:   [id number] FUROSEMIDE 20 MG TABS (FUROSEMIDE) Take 1 by mouth once daily  #90 x 3   Entered by:   Julieta Gutting, RN, BSN   Authorized by:   Ronaldo Miyamoto, MD, [facility]   Signed by:   Julieta Gutting, RN, BSN on 10/09/2009   Method used:   Electronically to        [facility] [address] [id number]  (retail)       [address]       [place], [place]  [address]       Ph: [phone] or [phone]       Fax: [phone]   RxID:   [id number] AMLODIPINE BESYLATE 10 MG TABS (AMLODIPINE BESYLATE) Take one tablet by mouth daily  #90 x 3   Entered by:   Julieta Gutting, RN, BSN   Authorized by:   Ronaldo Miyamoto, MD, [facility]   Signed by:   Julieta Gutting, RN, BSN on 10/09/2009   Method used:   Electronically to        [facility] [address] [id number] (retail)       [address]       [place], [place]  [address]       Ph: [phone] or [phone]       Fax: [phone]   RxID:   [id number]

## 2010-10-23 NOTE — Assessment & Plan Note (Signed)
Summary: 3 month rov   Visit Type:  Follow-up Primary Provider:  Jacky Kindle  CC:  no complaints.  History of Present Illness: No chest pain.  No shortness of breath.  Business has picked up a bit. No exertional symptoms.  Current Medications (verified): 1)  Furosemide 20 Mg Tabs (Furosemide) .... Take 1 By Mouth Once Daily 2)  Imdur 30 Mg Xr24h-Tab (Isosorbide Mononitrate) .... Take 1 By Mouth Once Daily 3)  Nitroglycerin 0.4 Mg Subl (Nitroglycerin) .... Place 1 Tablet Under Tongue As Directed 4)  Potassium Chloride Crys Cr 10 Meq Cr-Tabs (Potassium Chloride Crys Cr) .... Take 1 By Mouth Once Daily 5)  Crestor 10 Mg Tabs (Rosuvastatin Calcium) .... Take One Tablet By Mouth Daily. 6)  Tikosyn 250 Mcg Caps (Dofetilide) .... Take One Tablet By Mouth Twice Daily. 7)  Aspir-81 81 Mg Tbec (Aspirin) .Marland Kitchen.. 1 Tablet Every Other Day 8)  Daily Multiple Vitamins  Tabs (Multiple Vitamin) .Marland Kitchen.. 1 Tablet Every Day 9)  Metoprolol Tartrate 25 Mg Tabs (Metoprolol Tartrate) .... Take 1/2 Tablet By Mouth Twice A Day 10)  Glipizide 10 Mg Xr24h-Tab (Glipizide) .... Take 1 Tablet By Mouth Daily 11)  Amlodipine Besylate 10 Mg Tabs (Amlodipine Besylate) .... Take One Tablet By Mouth Daily 12)  Pradaxa 150 Mg Caps (Dabigatran Etexilate Mesylate) .... Take 1 Capsule By Mouth Two Times A Day 13)  Finasteride 5 Mg Tabs (Finasteride) .... Take One Tablet By Mouth Once Daily.  Allergies (verified): No Known Drug Allergies  Past History:  Past Medical History: Last updated: 12/07/2009 Current Problems:  CAD,   BYPASS GRAFT EF 40-45% (ICD-414.02) HYPERTENSION, BENIGN (ICD-401.1) HYPERCHOLESTEROLEMIA  IIA (ICD-272.0) DIABETES MELLITUS, TYPE II (ICD-250.00) ATRIAL TACHYCARDIA (ICD-427.89) ATRIAL FIBRILLATION (ICD-427.31) SINUS BRADYCARDIA (ICD-427.81) RENAL DISEASE, CHRONIC, STAGE II (ICD-585.2) EDEMA (ICD-782.3) BACK PAIN, CHRONIC (ICD-724.5) PROSTATE CANCER, HX OF (ICD-V10.46)  Past Surgical History: Last  updated: 12/22/2008 CABG 1990  Family History: Last updated: 12/07/2009 FAMILY HISTORY:  His mother died at 69 from old age and his father at  age of 69 with some type of fever in 65.  He has 2 brothers and 3  sisters.  All are deceased.      Social History: Last updated: 12/07/2009 SOCIAL HISTORY:  The patient resides in [place] alone.  He is   widowed.  He has 4 children, 9 grandchildren.    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  He denies any alcohol, drugs, or herbal medication.  He tries  to maintain a low-fat diet.  He states that he does exercise somewhat  with walking, and he uses a stationary bike for a few minutes every  other day.      Vital Signs:  Patient profile:   77 year old male Height:      71 inches Weight:      225 pounds BMI:     31.49 Pulse rate:   70 / minute BP sitting:   172 / 74  (left arm) Cuff size:   regular  Vitals Entered By: Hardin Negus, RMA (June 20, 2010 2:28 PM)  Physical Exam  General:  Well developed, well nourished, in no acute distress. Head:  normocephalic and atraumatic Eyes:  PERRLA/EOM intact; conjunctiva and lids normal. Lungs:  Clear bilaterally to auscultation and percussion. Heart:  PMI non displaced.   Normal S1 and S2.   Extremities:  No clubbing or cyanosis. Neurologic:  Alert and oriented x 3.  Impression & Recommendations:  Problem # 1:  CAD,   BYPASS GRAFT EF 40-45% (ICD-414.02)  No chest pain.  Stable at present.  Had MI when plavix was stopped last time.   His updated medication list for this problem includes:    Imdur 30 Mg Xr24h-tab (Isosorbide mononitrate) .Marland Kitchen... Take 1 by mouth once daily    Nitroglycerin 0.4 Mg Subl (Nitroglycerin) .Marland Kitchen... Place 1 tablet under tongue as directed    Aspir-81 81 Mg Tbec (Aspirin) .Marland Kitchen... 1 tablet every other day    Metoprolol Tartrate 25 Mg Tabs (Metoprolol tartrate) .Marland Kitchen... Take 1/2 tablet by mouth twice a day    Amlodipine  Besylate 10 Mg Tabs (Amlodipine besylate) .Marland Kitchen... Take one tablet by mouth daily  Orders: TLB-BMP (Basic Metabolic Panel-BMET) (80048-METABOL) TLB-Hepatic/Liver Function Pnl (80076-HEPATIC)  Problem # 2:  HYPERTENSION, BENIGN (ICD-401.1)  Not well controlled today.  It varies across the board.   His updated medication list for this problem includes:    Furosemide 20 Mg Tabs (Furosemide) .Marland Kitchen... Take 1 by mouth once daily    Aspir-81 81 Mg Tbec (Aspirin) .Marland Kitchen... 1 tablet every other day    Metoprolol Tartrate 25 Mg Tabs (Metoprolol tartrate) .Marland Kitchen... Take 1/2 tablet by mouth twice a day    Amlodipine Besylate 10 Mg Tabs (Amlodipine besylate) .Marland Kitchen... Take one tablet by mouth daily  Orders: TLB-BMP (Basic Metabolic Panel-BMET) (80048-METABOL) TLB-Hepatic/Liver Function Pnl (80076-HEPATIC)  Problem # 3:  HYPERCHOLESTEROLEMIA  IIA (ICD-272.0)  Being measured by primary.  His updated medication list for this problem includes:    Crestor 10 Mg Tabs (Rosuvastatin calcium) .Marland Kitchen... Take one tablet by mouth daily.  Orders: TLB-BMP (Basic Metabolic Panel-BMET) (80048-METABOL) TLB-Hepatic/Liver Function Pnl (80076-HEPATIC)  Problem # 4:  ATRIAL FIBRILLATION (ICD-427.31) Is on Pradaxa and would have to come off.   His updated medication list for this problem includes:    Tikosyn 250 Mcg Caps (Dofetilide) .Marland Kitchen... Take one tablet by mouth twice daily.    Aspir-81 81 Mg Tbec (Aspirin) .Marland Kitchen... 1 tablet every other day    Metoprolol Tartrate 25 Mg Tabs (Metoprolol tartrate) .Marland Kitchen... Take 1/2 tablet by mouth twice a day  Patient Instructions: 1)  Your physician recommends that you have lab work today: BMP and Liver 2)  Your physician recommends that you continue on your current medications as directed. Please refer to the Current Medication list given to you today. 3)  Your physician wants you to follow-up in:   4 MONTHS. You will receive a reminder letter in the mail two months in advance. If you don't receive a  letter, please call our office to schedule the follow-up appointment.

## 2010-10-23 NOTE — Assessment & Plan Note (Signed)
Summary: f55m   Visit Type:  Follow-up Primary Provider:  Jacky Kindle   History of Present Illness: Overall doing well.  Had some indigestion with Pradaxa.  No chest pain.  Has taken one Prilosec.  Stable without significant symptoms.  His back is bothering him alot.  He is ok during the day, but at night it really bothers him.  He remains stable.    Current Medications (verified): 1)  Furosemide 20 Mg Tabs (Furosemide) .... Take 1 By Mouth Once Daily 2)  Imdur 30 Mg Xr24h-Tab (Isosorbide Mononitrate) .... Take 1 By Mouth Once Daily 3)  Nitroglycerin 0.4 Mg Subl (Nitroglycerin) .... Place 1 Tablet Under Tongue As Directed 4)  Potassium Chloride Crys Cr 10 Meq Cr-Tabs (Potassium Chloride Crys Cr) .... Take 1 By Mouth Once Daily 5)  Crestor 10 Mg Tabs (Rosuvastatin Calcium) .... Take One Tablet By Mouth Daily. 6)  Tikosyn 250 Mcg Caps (Dofetilide) .... Take One Tablet By Mouth Twice Daily. 7)  Aspir-81 81 Mg Tbec (Aspirin) .Marland Kitchen.. 1 Tablet Every Other Day 8)  Daily Multiple Vitamins  Tabs (Multiple Vitamin) .Marland Kitchen.. 1 Tablet Every Day 9)  Metoprolol Tartrate 25 Mg Tabs (Metoprolol Tartrate) .... Take 1/2 Tablet By Mouth Twice A Day 10)  Glipizide 10 Mg Xr24h-Tab (Glipizide) .... Take 1 Tablet By Mouth Daily 11)  Amlodipine Besylate 10 Mg Tabs (Amlodipine Besylate) .... Take One Tablet By Mouth Daily 12)  Pradaxa 150 Mg Caps (Dabigatran Etexilate Mesylate) .... Take 1 Capsule By Mouth Two Times A Day 13)  Finasteride 5 Mg Tabs (Finasteride) .... Take One Tablet By Mouth Once Daily. 14)  Pradaxa 150 Mg Caps (Dabigatran Etexilate Mesylate) .... Take One Capsule By Mouth Twice Daily.  Allergies (verified): No Known Drug Allergies  Past History:  Past Medical History: Last updated: 12/07/2009 Current Problems:  CAD,   BYPASS GRAFT EF 40-45% (ICD-414.02) HYPERTENSION, BENIGN (ICD-401.1) HYPERCHOLESTEROLEMIA  IIA (ICD-272.0) DIABETES MELLITUS, TYPE II (ICD-250.00) ATRIAL TACHYCARDIA  (ICD-427.89) ATRIAL FIBRILLATION (ICD-427.31) SINUS BRADYCARDIA (ICD-427.81) RENAL DISEASE, CHRONIC, STAGE II (ICD-585.2) EDEMA (ICD-782.3) BACK PAIN, CHRONIC (ICD-724.5) PROSTATE CANCER, HX OF (ICD-V10.46)  Vital Signs:  Patient profile:   77 year old male Height:      71 inches Weight:      226 pounds BMI:     31.63 Pulse rate:   61 / minute BP sitting:   140 / 63  (left arm)  Vitals Entered By: Laurance Flatten CMA (March 19, 2010 2:16 PM)  Physical Exam  General:  Well developed, well nourished, in no acute distress. Head:  normocephalic and atraumatic Eyes:  PERRLA/EOM intact; conjunctiva and lids normal. Lungs:  Clear bilaterally to auscultation and percussion. Heart:  PMI non displaced.  Normal S1 and S2.  No rub. Abdomen:  Bowel sounds positive; abdomen soft and non-tender without masses, organomegaly, or hernias noted. No hepatosplenomegaly. Msk:  Back normal, normal gait. Muscle strength and tone normal. Pulses:  pulses normal in all 4 extremities Extremities:  No clubbing or cyanosis.  2 plus edema LLE.    EKG  Procedure date:  03/19/2010  Findings:      NSr.  Nonspecific ST and T change.  QTc .  Impression & Recommendations:  Problem # 1:  CAD,   BYPASS GRAFT EF 40-45% (ICD-414.02) Continues to remain stable.  No chest pain. Tolerating meds well.  His updated medication list for this problem includes:    Imdur 30 Mg Xr24h-tab (Isosorbide mononitrate) .Marland Kitchen... Take 1 by mouth once daily    Nitroglycerin  0.4 Mg Subl (Nitroglycerin) .Marland Kitchen... Place 1 tablet under tongue as directed    Aspir-81 81 Mg Tbec (Aspirin) .Marland Kitchen... 1 tablet every other day    Metoprolol Tartrate 25 Mg Tabs (Metoprolol tartrate) .Marland Kitchen... Take 1/2 tablet by mouth twice a day    Amlodipine Besylate 10 Mg Tabs (Amlodipine besylate) .Marland Kitchen... Take one tablet by mouth daily  Orders: TLB-BMP (Basic Metabolic Panel-BMET) (80048-METABOL) TLB-CBC Platelet - w/Differential (85025-CBCD) TLB-Hepatic/Liver  Function Pnl (80076-HEPATIC)  Problem # 2:  ATRIAL FIBRILLATION (ICD-427.31) Doing well.  ECG with borderline long QTc.  Will check Mg and K.   His updated medication list for this problem includes:    Tikosyn 250 Mcg Caps (Dofetilide) .Marland Kitchen... Take one tablet by mouth twice daily.    Aspir-81 81 Mg Tbec (Aspirin) .Marland Kitchen... 1 tablet every other day    Metoprolol Tartrate 25 Mg Tabs (Metoprolol tartrate) .Marland Kitchen... Take 1/2 tablet by mouth twice a day  Orders: EKG w/ Interpretation (93000) TLB-BMP (Basic Metabolic Panel-BMET) (80048-METABOL) TLB-CBC Platelet - w/Differential (85025-CBCD) TLB-Hepatic/Liver Function Pnl (80076-HEPATIC) TLB-Magnesium (Mg) (83735-MG)  Problem # 3:  HYPERTENSION, BENIGN (ICD-401.1) stable at present. His updated medication list for this problem includes:    Furosemide 20 Mg Tabs (Furosemide) .Marland Kitchen... Take 1 by mouth once daily    Aspir-81 81 Mg Tbec (Aspirin) .Marland Kitchen... 1 tablet every other day    Metoprolol Tartrate 25 Mg Tabs (Metoprolol tartrate) .Marland Kitchen... Take 1/2 tablet by mouth twice a day    Amlodipine Besylate 10 Mg Tabs (Amlodipine besylate) .Marland Kitchen... Take one tablet by mouth daily  Orders: EKG w/ Interpretation (93000) TLB-BMP (Basic Metabolic Panel-BMET) (80048-METABOL) TLB-CBC Platelet - w/Differential (85025-CBCD) TLB-Hepatic/Liver Function Pnl (80076-HEPATIC) TLB-Magnesium (Mg) (83735-MG)  His updated medication list for this problem includes:    Furosemide 20 Mg Tabs (Furosemide) .Marland Kitchen... Take 1 by mouth once daily    Aspir-81 81 Mg Tbec (Aspirin) .Marland Kitchen... 1 tablet every other day    Metoprolol Tartrate 25 Mg Tabs (Metoprolol tartrate) .Marland Kitchen... Take 1/2 tablet by mouth twice a day    Amlodipine Besylate 10 Mg Tabs (Amlodipine besylate) .Marland Kitchen... Take one tablet by mouth daily  Problem # 4:  EDEMA (ICD-782.3) Doppler in 2010 negative for DVT.   Patient Instructions: 1)  Your physician recommends that you schedule a follow-up appointment in: 3 MONTHS 2)  Your physician  recommends that you continue on your current medications as directed. Please refer to the Current Medication list given to you today. 3)  Your physician recommends that you have lab work today: BMP, CBC, Magnesium, LIVER Prescriptions: AMLODIPINE BESYLATE 10 MG TABS (AMLODIPINE BESYLATE) Take one tablet by mouth daily  #90 x 3   Entered by:   Julieta Gutting, RN, BSN   Authorized by:   Ronaldo Miyamoto, MD, [facility]   Signed by:   Julieta Gutting, RN, BSN on 03/19/2010   Method used:   Electronically to        [id number] (retail)       [address]       [place], [place]  [address]       Ph: [phone] or [phone]       Fax: [phone]   RxID:   [id number]

## 2010-10-23 NOTE — Assessment & Plan Note (Signed)
Summary: PER CHECK OUT/SF   Primary Haiden Rawlinson:  Jacky Kindle   History of Present Illness: Mr. Dise is seen in followup for atrial tachycardia/fibrillation for which he was started on Tikosyn about a year ago.  he has tolerated it well. He was recently changed from Coumadin to Pradaxa. There were some transient GI symptoms; he is tolerating it well at this point.    He also has a history of  coronary artery disease and is status post bypass grafting in 1990 and a subsequent study in 2008 complicated by VF and with stenting of the PDA        Current Medications (verified): 1)  Furosemide 20 Mg Tabs (Furosemide) .... Take 1 By Mouth Once Daily 2)  Imdur 30 Mg Xr24h-Tab (Isosorbide Mononitrate) .... Take 1 By Mouth Once Daily 3)  Nitroglycerin 0.4 Mg Subl (Nitroglycerin) .... Place 1 Tablet Under Tongue As Directed 4)  Potassium Chloride Crys Cr 10 Meq Cr-Tabs (Potassium Chloride Crys Cr) .... Take 1 By Mouth Once Daily 5)  Crestor 10 Mg Tabs (Rosuvastatin Calcium) .... Take One Tablet By Mouth Daily. 6)  Tikosyn 250 Mcg Caps (Dofetilide) .... Take One Tablet By Mouth Twice Daily. 7)  Aspir-81 81 Mg Tbec (Aspirin) .Marland Kitchen.. 1 Tablet Every Other Day 8)  Daily Multiple Vitamins  Tabs (Multiple Vitamin) .Marland Kitchen.. 1 Tablet Every Day 9)  Metoprolol Tartrate 25 Mg Tabs (Metoprolol Tartrate) .... Take 1/2 Tablet By Mouth Twice A Day 10)  Glipizide 10 Mg Xr24h-Tab (Glipizide) .... Take 1 Tablet By Mouth Daily 11)  Amlodipine Besylate 10 Mg Tabs (Amlodipine Besylate) .... Take One Tablet By Mouth Daily 12)  Pradaxa 150 Mg Caps (Dabigatran Etexilate Mesylate) .... Take 1 Capsule By Mouth Two Times A Day 13)  Finasteride 5 Mg Tabs (Finasteride) .... Take One Tablet By Mouth Once Daily. 14)  Pradaxa 150 Mg Caps (Dabigatran Etexilate Mesylate) .... Take One Capsule By Mouth Twice Daily.  Allergies (verified): No Known Drug Allergies  Past History:  Past Medical History: Last updated: 12/07/2009 Current Problems:    CAD,   BYPASS GRAFT EF 40-45% (ICD-414.02) HYPERTENSION, BENIGN (ICD-401.1) HYPERCHOLESTEROLEMIA  IIA (ICD-272.0) DIABETES MELLITUS, TYPE II (ICD-250.00) ATRIAL TACHYCARDIA (ICD-427.89) ATRIAL FIBRILLATION (ICD-427.31) SINUS BRADYCARDIA (ICD-427.81) RENAL DISEASE, CHRONIC, STAGE II (ICD-585.2) EDEMA (ICD-782.3) BACK PAIN, CHRONIC (ICD-724.5) PROSTATE CANCER, HX OF (ICD-V10.46)  Past Surgical History: Last updated: 12/22/2008 CABG 1990  Family History: Last updated: 12/07/2009 FAMILY HISTORY:  His mother died at 71 from old age and his father at  age of 40 with some type of fever in 79.  He has 2 brothers and 3  sisters.  All are deceased.      Social History: Last updated: 12/07/2009 SOCIAL HISTORY:  The patient resides in [place] alone.  He is   widowed.  He has 4 children, 9 grandchildren.    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  He denies any alcohol, drugs, or herbal medication.  He tries  to maintain a low-fat diet.  He states that he does exercise somewhat  with walking, and he uses a stationary bike for a few minutes every  other day.      Vital Signs:  Patient profile:   77 year old male Height:      71 inches Weight:      221 pounds BMI:     30.93 Pulse rate:   75 / minute Pulse rhythm:   regular BP sitting:  140 / 82  (right arm) Cuff size:   regular  Vitals Entered By: Judithe Modest CMA (May 29, 2010 2:03 PM)  Physical Exam  General:  The patient was alert and oriented in no acute distress. HEENT Normal.  Neck veins were flat, carotids were brisk.  Lungs were clear.  Heart sounds were regular without murmurs or gallops.  Abdomen was soft with active bowel sounds. There is no clubbing cyanosis or edema. Skin Warm and dry neuro exam is notable for being hard of hearing   Impression & Recommendations:  Problem # 1:  ATRIAL FIBRILLATION (ICD-427.31) the patient has atrial arrhythmias. He is  taking Pradaxa and Tikosyn. Potassium and magnesium levels were checked in June by Dr. Riley Kill; they were normal. ECG today is acceptable. His updated medication list for this problem includes:    Tikosyn 250 Mcg Caps (Dofetilide) .Marland Kitchen... Take one tablet by mouth twice daily.    Aspir-81 81 Mg Tbec (Aspirin) .Marland Kitchen... 1 tablet every other day    Metoprolol Tartrate 25 Mg Tabs (Metoprolol tartrate) .Marland Kitchen... Take 1/2 tablet by mouth twice a day  Problem # 2:  CAD,   BYPASS GRAFT EF 40-45% (ICD-414.02) no symptoms of chest pain. We'll continue him on his current medications His updated medication list for this problem includes:    Imdur 30 Mg Xr24h-tab (Isosorbide mononitrate) .Marland Kitchen... Take 1 by mouth once daily    Nitroglycerin 0.4 Mg Subl (Nitroglycerin) .Marland Kitchen... Place 1 tablet under tongue as directed    Aspir-81 81 Mg Tbec (Aspirin) .Marland Kitchen... 1 tablet every other day    Metoprolol Tartrate 25 Mg Tabs (Metoprolol tartrate) .Marland Kitchen... Take 1/2 tablet by mouth twice a day    Amlodipine Besylate 10 Mg Tabs (Amlodipine besylate) .Marland Kitchen... Take one tablet by mouth daily  Other Orders: EKG w/ Interpretation (93000)  Patient Instructions: 1)  Your physician recommends that you continue on your current medications as directed. Please refer to the Current Medication list given to you today. 2)  Your physician wants you to follow-up in:  1 year You will receive a reminder letter in the mail two months in advance. If you don't receive a letter, please call our office to schedule the follow-up appointment.

## 2010-10-23 NOTE — Miscellaneous (Signed)
Summary: update med  Clinical Lists Changes  Medications: Added new medication of PRADAXA 150 MG CAPS (DABIGATRAN ETEXILATE MESYLATE) Take 1 capsule by mouth two times a day Removed medication of * PRADAXA 150MG  take one tablet by mouth twice a day

## 2010-10-23 NOTE — Assessment & Plan Note (Signed)
Summary: 2 month   Visit Type:  2 months follow up Primary Provider:  Jacky Kindle  CC:  Cough.  History of Present Illness: Overall doing ok.  Left hand has been swelling up quite a bit.  No real changes from the last visit overall.  Denies ongoing symptoms.  Tolerating Pradaxa relatively well..  No major symptoms at the present time.  The new medication is much more convenient for him.    Current Medications (verified): 1)  Furosemide 20 Mg Tabs (Furosemide) .... Take 1 By Mouth Once Daily 2)  Imdur 30 Mg Xr24h-Tab (Isosorbide Mononitrate) .... Take 1 By Mouth Once Daily 3)  Nitroglycerin 0.4 Mg Subl (Nitroglycerin) .... Place 1 Tablet Under Tongue As Directed 4)  Potassium Chloride Crys Cr 10 Meq Cr-Tabs (Potassium Chloride Crys Cr) .... Take 1 By Mouth Once Daily 5)  Crestor 10 Mg Tabs (Rosuvastatin Calcium) .... Take One Tablet By Mouth Daily. 6)  Tikosyn 250 Mcg Caps (Dofetilide) .... Take One Tablet By Mouth Twice Daily. 7)  Aspir-81 81 Mg Tbec (Aspirin) .Marland Kitchen.. 1 Tablet Every Other Day 8)  Daily Multiple Vitamins  Tabs (Multiple Vitamin) .Marland Kitchen.. 1 Tablet Every Day 9)  Metoprolol Tartrate 25 Mg Tabs (Metoprolol Tartrate) .... Take 1/2 Tablet By Mouth Twice A Day 10)  Glipizide 10 Mg Xr24h-Tab (Glipizide) .... Take 1 Tablet By Mouth Daily 11)  Amlodipine Besylate 10 Mg Tabs (Amlodipine Besylate) .... Take One Tablet By Mouth Daily 12)  Pradaxa 150 Mg Caps (Dabigatran Etexilate Mesylate) .... Take 1 Capsule By Mouth Two Times A Day  Allergies (verified): No Known Drug Allergies  Vital Signs:  Patient profile:   77 year old male Height:      71 inches Weight:      225.50 pounds BMI:     31.56 Pulse rate:   68 / minute Pulse rhythm:   regular Resp:     18 per minute BP sitting:   150 / 68  (right arm) Cuff size:   large  Vitals Entered By: Vikki Ports (December 12, 2009 11:58 AM)  Physical Exam  General:  Well developed, well nourished, in no acute distress. Head:  normocephalic  and atraumatic Eyes:  PERRLA/EOM intact; conjunctiva and lids normal. Lungs:  clear to auscultation and percussion Heart:  [place] S1 and S2.  Prom S4.  no def murmur. Abdomen:  Bowel sounds positive; abdomen soft and non-tender without masses, organomegaly, or hernias noted. No hepatosplenomegaly. Msk:  Back normal, normal gait. Muscle strength and tone normal. Pulses:  pulses normal in all 4 extremities Extremities:  Trace edema LLE.  Minor. Neurologic:  Alert and oriented x 3.   EKG  Procedure date:  12/12/2009  Findings:      SB.  Nonspecific ST and T abnormality.  Prolonged QTc, 461 ms, less than prior tracing.    Impression & Recommendations:  Problem # 1:  CAD,   BYPASS GRAFT EF 40-45% (ICD-414.02) Currently symptoms remain stable on a medical regimen.  Not having much in the way of angina, and seems to be doing overall pretty well.  Continue current regimen.  His updated medication list for this problem includes:    Imdur 30 Mg Xr24h-tab (Isosorbide mononitrate) .Marland Kitchen... Take 1 by mouth once daily    Nitroglycerin 0.4 Mg Subl (Nitroglycerin) .Marland Kitchen... Place 1 tablet under tongue as directed    Aspir-81 81 Mg Tbec (Aspirin) .Marland Kitchen... 1 tablet every other day    Metoprolol Tartrate 25 Mg Tabs (Metoprolol tartrate) .Marland Kitchen... Take 1/2  tablet by mouth twice a day    Amlodipine Besylate 10 Mg Tabs (Amlodipine besylate) .Marland Kitchen... Take one tablet by mouth daily  Orders: EKG w/ Interpretation (93000)  Problem # 2:  HYPERTENSION, BENIGN (ICD-401.1) Not perfect, but controlled.   His updated medication list for this problem includes:    Furosemide 20 Mg Tabs (Furosemide) .Marland Kitchen... Take 1 by mouth once daily    Aspir-81 81 Mg Tbec (Aspirin) .Marland Kitchen... 1 tablet every other day    Metoprolol Tartrate 25 Mg Tabs (Metoprolol tartrate) .Marland Kitchen... Take 1/2 tablet by mouth twice a day    Amlodipine Besylate 10 Mg Tabs (Amlodipine besylate) .Marland Kitchen... Take one tablet by mouth daily  Orders: EKG w/ Interpretation  (93000)  Problem # 3:  HYPERCHOLESTEROLEMIA  IIA (ICD-272.0) Tolerating medications well.   His updated medication list for this problem includes:    Crestor 10 Mg Tabs (Rosuvastatin calcium) .Marland Kitchen... Take one tablet by mouth daily.  Problem # 4:  ATRIAL FIBRILLATION (ICD-427.31) In rhythm on Tikosyn.  See ECG report.  His updated medication list for this problem includes:    Tikosyn 250 Mcg Caps (Dofetilide) .Marland Kitchen... Take one tablet by mouth twice daily.    Aspir-81 81 Mg Tbec (Aspirin) .Marland Kitchen... 1 tablet every other day    Metoprolol Tartrate 25 Mg Tabs (Metoprolol tartrate) .Marland Kitchen... Take 1/2 tablet by mouth twice a day  Orders: EKG w/ Interpretation (93000)  Patient Instructions: 1)  Your physician recommends that you schedule a follow-up appointment in: 3 MONTHS 2)  Your physician recommends that you continue on your current medications as directed. Please refer to the Current Medication list given to you today.

## 2010-10-26 ENCOUNTER — Other Ambulatory Visit: Payer: Self-pay | Admitting: Cardiology

## 2010-10-26 ENCOUNTER — Other Ambulatory Visit (INDEPENDENT_AMBULATORY_CARE_PROVIDER_SITE_OTHER): Payer: Medicare Other

## 2010-10-26 ENCOUNTER — Encounter: Payer: Self-pay | Admitting: Cardiology

## 2010-10-26 DIAGNOSIS — I10 Essential (primary) hypertension: Secondary | ICD-10-CM

## 2010-10-26 DIAGNOSIS — I2510 Atherosclerotic heart disease of native coronary artery without angina pectoris: Secondary | ICD-10-CM

## 2010-10-26 DIAGNOSIS — I4891 Unspecified atrial fibrillation: Secondary | ICD-10-CM

## 2010-10-26 LAB — BASIC METABOLIC PANEL
CO2: 26 mEq/L (ref 19–32)
Calcium: 9 mg/dL (ref 8.4–10.5)
Sodium: 139 mEq/L (ref 135–145)

## 2010-11-06 ENCOUNTER — Ambulatory Visit (INDEPENDENT_AMBULATORY_CARE_PROVIDER_SITE_OTHER): Payer: Medicare Other | Admitting: Cardiology

## 2010-11-06 ENCOUNTER — Encounter: Payer: Self-pay | Admitting: Cardiology

## 2010-11-06 DIAGNOSIS — I2510 Atherosclerotic heart disease of native coronary artery without angina pectoris: Secondary | ICD-10-CM

## 2010-11-06 DIAGNOSIS — I4891 Unspecified atrial fibrillation: Secondary | ICD-10-CM

## 2010-11-06 DIAGNOSIS — E7800 Pure hypercholesterolemia, unspecified: Secondary | ICD-10-CM

## 2010-11-07 ENCOUNTER — Encounter (INDEPENDENT_AMBULATORY_CARE_PROVIDER_SITE_OTHER): Payer: Medicare Other

## 2010-11-07 DIAGNOSIS — I472 Ventricular tachycardia, unspecified: Secondary | ICD-10-CM

## 2010-11-08 ENCOUNTER — Other Ambulatory Visit: Payer: Self-pay | Admitting: Cardiology

## 2010-11-08 ENCOUNTER — Encounter: Payer: Self-pay | Admitting: Cardiology

## 2010-11-08 ENCOUNTER — Other Ambulatory Visit (INDEPENDENT_AMBULATORY_CARE_PROVIDER_SITE_OTHER): Payer: Medicare Other

## 2010-11-08 DIAGNOSIS — I25810 Atherosclerosis of coronary artery bypass graft(s) without angina pectoris: Secondary | ICD-10-CM | POA: Insufficient documentation

## 2010-11-08 DIAGNOSIS — I4891 Unspecified atrial fibrillation: Secondary | ICD-10-CM

## 2010-11-08 LAB — CBC WITH DIFFERENTIAL/PLATELET
Basophils Relative: 0.2 % (ref 0.0–3.0)
Eosinophils Relative: 0.7 % (ref 0.0–5.0)
HCT: 40.6 % (ref 39.0–52.0)
Lymphs Abs: 1.1 10*3/uL (ref 0.7–4.0)
MCV: 87.4 fl (ref 78.0–100.0)
Monocytes Absolute: 1.4 10*3/uL — ABNORMAL HIGH (ref 0.1–1.0)
Neutro Abs: 12.1 10*3/uL — ABNORMAL HIGH (ref 1.4–7.7)
RBC: 4.65 Mil/uL (ref 4.22–5.81)
WBC: 14.8 10*3/uL — ABNORMAL HIGH (ref 4.5–10.5)

## 2010-11-08 LAB — BASIC METABOLIC PANEL
BUN: 19 mg/dL (ref 6–23)
Chloride: 107 mEq/L (ref 96–112)
Potassium: 4.1 mEq/L (ref 3.5–5.1)

## 2010-11-08 LAB — HEPATIC FUNCTION PANEL
ALT: 16 U/L (ref 0–53)
Total Protein: 6.3 g/dL (ref 6.0–8.3)

## 2010-11-09 ENCOUNTER — Other Ambulatory Visit: Payer: Medicare Other

## 2010-11-12 NOTE — Consult Note (Signed)
NAMESECUNDINO, ELLITHORPE NO.:  [id number]  MEDICAL RECORD NO.:  [id number]          PATIENT TYPE:  INP  LOCATION:  2923                         FACILITY:  [facility]  PHYSICIAN:  Duke Salvia, MD, FACCDATE OF BIRTH:  1927/10/07  DATE OF CONSULTATION:  10/19/2010 DATE OF DISCHARGE:                                CONSULTATION   Thank you very much for asking [place] to see [name] in consultation for supraventricular tachycardia.  Shawn [name] is an 77 year old gentleman with a history of coronary artery disease with prior bypass surgery with CABG in 1990s, STEMI in 2008, with subsequent stenting and patent grafts.  Ejection fraction in the fall 2010 was 65%.  He also has a history of atrial fibrillation, for which he was treated with Tikosyn and Pradaxa in the context of diabetes and hypertension.  He was admitted with progressive shortness of breath.  This has been increasing over the last 6-8 weeks.  It is accompanied by nocturnal dyspnea, orthopnea, peripheral edema, exercise intolerance, and fatigue. It is unaccompanied by chest pain.  When he presented to the hospital, he was found to be in a junctional tachycardia and then there is some irregularities and was felt to be in recurrent atrial fibrillation.  He was treated with Cardizem with subsequent intermittent slowing.  PAST MEDICAL HISTORY:  In addition to above is notable for: 1. Chronic renal insufficiency, grade 3. 2. Chronic back pain. 3. Prostate cancer. 4. Arthritis.  OUTPATIENT MEDICATIONS: 1. Glipizide. 2. Potassium. 3. Crestor. 4. Tikosyn 25. 5. Lasix 20. 6. Lopressor 12.5 b.i.d. 7. Imdur. 8. Amlodipine 10. 9. Finasteride. 10.Pradaxa 150 b.i.d. 11.Prednisone. 12.Aspirin. 13.Multivitamins.  The patient has no known drug allergies.  SOCIAL HISTORY:  The patient lives in [place] with his son.  He works Clinical cytogeneticist trade shows.  He does not use cigarettes, alcohol, or recreational  drugs.  FAMILY HISTORY:  Notable for the absence of coronary artery disease.  REVIEW OF SYSTEMS:  Broadly negative except as noted previously.  LABORATORIES:  On admission were notable for hematocrit of 41.8, creatinine of 1.3, normal electrolytes.  Electrocardiograms are quite numerous.  On October 18, 2010, at 1500 hours demonstrates junctional tachycardia with 5-4 retrograde Wenckebach.  On October 19, 2010, at 3:46 hours demonstrates a junctional tachycardia with 2:1 retrograde Wenckebach.  The junctional cycle length is 400 milliseconds.  Telemetry demonstrates that this tachycardia is frequently induced by PVC.  Electrocardiogram at 3:46:56, that is 20 seconds later demonstrates a junctional rhythm with a one-to-one junctional atrial conduction at a rate of 98 and last night at 2100 hours, we had a junctional rhythm at about 520 milliseconds with some variability actually with some coupling that suggests either antegrade and then retrograde conductions from the P-waves or phenomenon whose name I do not recall where RR intervals with a interpolated P-waves have a shorter cycle length.  Electrocardiograms were reviewed from 2010 and 2001, where we had 5 or 6 beats appears to be consistent with atrial fibrillation.  I should note that his BNP was 1200 and 14 milliseconds.  IMPRESSION: 1. Junctional rhythm with tachycardias of  various cycle length with     retrograde conduction with one-to-one, one-to-two, two-to-three,     and four-to-five Wenckebach. 2. History of atrial arrhythmias confirmed by ECGs treated with     Tikosyn and Pradaxa. 3. Congestive heart failure - acute question systolic, question does     he have a rate-related myopathy secondary to prolonged tachycardia. 4. History of sinus bradycardia. 5. Ischemic heart disease with prior CABG with intercurrent ST segment     elevation myocardial infarction and stenting and an ejection     fraction in 2010 of  65%.  Shawn [name] has junctional rhythms including tachycardia.  The initiation of the tachycardia with PVCs suggest that there may be reentrant component.  There has been a longstanding discussion in EP as to whether AV nodal reentry can occur without atrial common pathway, largely the thought is no and so I suspect given the baseline junctional rhythm that this is inter-junctional.  That has significant implications as relates to the potential for ablative therapy.  I think it would have to be anticipated that complete heart block would ensue if tachycardia ablation were pursued.  We may end up there anyway, however, it would be nice to see if we could avoid that.  To that end, the intriguing possibility is that Tikosyn somehow responsible, it is worth considering.  Furthermore, the fact that if Tikosyn does not work, makes the following recommendations.  Stop the Tikosyn if would happens.  In the event that it has no bearing and as we discontinued the Cardizem, if the tachycardia resumes, literature review suggest that amiodarone would be the next drug of choice.  In the old days, Dilantin was tried and we have tried that once here in [facility] recently with some success.  For right now, I would then; 1. Obtain a 2D echo to look at left ventricular function. 2. Discontinue the Tikosyn. 3. Discontinue the Pradaxa and use SCDs in the interim. 4. Plan to discontinue the Cardizem in the next 24 hours. 5. Consider the use of amiodarone thereafter. 6. Anticipate likely need for pacemaker.  Thank you for the consultation.     Duke Salvia, MD, [facility]     SCK/MEDQ  D:  10/19/2010  T:  10/20/2010  Job:  [id number]  Electronically Signed by Sherryl Manges MD [facility] on 11/12/2010 09:57:49 PM

## 2010-11-12 NOTE — Discharge Summary (Signed)
Shawn [name], STEMM NO.:  [id number]  MEDICAL RECORD NO.:  [id number]          PATIENT TYPE:  INP  LOCATION:  3736                         FACILITY:  [facility]  PHYSICIAN:  Duke Salvia, MD, FACCDATE OF BIRTH:  13-Feb-1928  DATE OF ADMISSION:  10/18/2010 DATE OF DISCHARGE:  10/23/2010                              DISCHARGE SUMMARY   PRIMARY CARDIOLOGIST:  Maisie Fus D. Riley Kill, MD, [facility]  ELECTROPHYSIOLOGIST:  Duke Salvia, MD, [facility]  PRIMARY MEDICAL DOCTOR:  Geoffry Paradise, MD.  DISCHARGE DIAGNOSES: 1. Junctional tachycardia of various cycle length with retrograde     conduction (1:1, 1:2, 2:3, 4:5 Wenckebach).     a.     Tikosyn, beta-blocker, and Pradaxa therapy discontinued;      initiated on amiodarone therapy, maintained normal sinus rhythm on      discharge. 2. Acute mixed congestive heart failure secondary to ischemic question     tachycardia-mediated cardiomyopathy and tachycardia in the setting     of mild left ventricular hypertrophy.     a.     Brisk response to initial intravenous Lasix, continued for      48 hours.     b.     A 2-D echocardiogram, October 19, 2010:  Poor quality images      with mild left ventricular hypertrophy and left ventricular      ejection fraction of 45-50% (ejection fraction previously      documented as 60- 65%, September 2010). 3. Sinus node dysfunction.     a.     The patient has history bradycardia, but had no bradycardia      this admission once off Tikosyn, beta blockers, calcium channel      blockers. 4. Hypertension.     a.     The patient's beta-blockade and calcium channel blocker      therapy discontinued.  Peak blood pressure, inpatient, 157/73. 5. Mild TSH elevation (TSH 4.941).  SECONDARY DIAGNOSES: 1. Coronary artery disease.     a.     Coronary artery bypass graft, 1990 (left internal mammary      artery - left anterior descending, saphenous vein graft - obtuse      marginal 1, saphenous vein  graft - right coronary artery).     b.     ST elevation myocardial infarction, 2008 with ventricular      fibrillation arrest and subsequent stenting to the posterior      descending artery, 3 grafts, patent (last cardiac      catheterization). 2. Non-insulin-dependent diabetes mellitus. 3. Hyperlipidemia. 4. History of prostate cancer. 5. Chronic kidney disease, stage III. 6. Chronic back pain. 7. Atrial fibrillation.     a.     Prior antiarrhythmic therapy with Tikosyn and thromboembolic      risk reduction with Pradaxa on the setting of diabetes and      hypertension as well as age and coronary artery disease.  ALLERGIES:  NKDA.  PROCEDURES: 1. Chest x-ray, October 18, 2010:  No acute cardiopulmonary     abnormality. 2. EKG, October 18, 2010:  Junctional tachycardia with varying  cycle     length. 3. A 2-D echocardiogram, October 19, 2010:  Poor image quality with     mild LVH, LVEF 45-50%. 4. EKG, junctional rhythm with frequent PACs and PVCs, QRS 96 and QTc     of 467.  HISTORY OF PRESENT ILLNESS:  Mr. Polinsky is an 78 year old Caucasian gentleman with the above-noted complex medical history, who was admitted on October 18, 2010, to [facility] given complaints of shortness of breath.  Subsequently, was found to be in either atrial fibrillation or junctional tachycardia.  He denied chest discomfort at that time.  He was rate controlled with Cardizem with initial good response.  He was also noted to be volume overloaded and was treated with IV Lasix with prompt response and significant improvement in symptoms. Electrophysiology was consulted at the time of admission.  HOSPITAL COURSE:  The patient was admitted and Electrophysiology saw the patient on October 19, 2010.  After extensive review of his electrocardiograms and thorough investigation into his telemetry, it was determined that the patient had junctional tachycardia of various cycle length with retrograde conduction  with 1:1, 1:2, 2:3, 2:4 Wenckebach. Often this tachycardia was induced by a premature ventricular complex. Dr. Graciela Husbands ordered a 2-D echocardiogram and discontinued his Tikosyn therapy as well as Pradaxa.  He discontinued Cardizem at that time and felt that there was a possibility of need for pacemaker given his history of bradycardia and sinus node dysfunction.  Also, hoped to initiate on amiodarone therapy at that time.  The patient was noted to have slightly depressed EF compared to his prior echocardiogram that was within normal limits with an EF noted on October 19, 2010 of 45-50%.  He was then initiated on amiodarone therapy and tolerated this medication well.  Beta-blockade therapy was also discontinued and the patient had no significant tachycardia for the duration of his hospital course and will remain on no anticoagulation and only amiodarone therapy at the time of discharge.  As the patient had no significant bradycardia throughout the rest of his hospital course, no plans for pacemaker implantation are being pursued at this time.  The patient worked with cardiac rehab phase I on January 30, ambulating 460 feet without any significant symptoms.  The patient was seen in the morning of October 23, 2010, by his electrophysiologist, Dr. Sherryl Manges.  At that time, the patient was noted to be mildly hypertensive with systolic blood pressure ranging from 133-157 over the last 24 hours.  He was initiated on lisinopril therapy early on very low dose of 2.5 mg p.o. daily and this will be increased to 5 mg p.o. daily.  His amiodarone therapy will be tapered.  Please see discharge meds for details.  He has been set up early followup with his primary cardiologist, Dr. Shawnie Pons on November 06, 2010, at [facility].  At the time of discharge, the patient received his new medication list, prescriptions, followup instructions, and all questions and concerns were  addressed prior to leaving the hospital.  DISCHARGE LABORATORIES:  WBC is 10.2, HGB 12.6, HCT 39.4, PLT count 57. WBC differential on admission was within normal limits except for neutrophils of 78% and absolute neutrophils of 9.4%, D-dimer less than 0.22.  Blood glucose ranged from 64 to 321.  Sodium 138, potassium 3.9, chloride 101, bicarb 28, BUN 17, creatinine 1.25.  Liver function tests within normal limits on date of admission; albumin 3.2, calcium 8.7, magnesium 2.0.  Hemoglobin A1c 8.9%.  Point-of-care markers  negative x1. Two full sets of enzymes negative.  BNP 1214 on admission, not rechecked.  Total cholesterol 89, triglycerides 76, HDL 33, LDL 41, total cholesterol/HDL ratio 2.7.  TSH 4.941.  FOLLOWUP PLANS AND APPOINTMENTS:  Please see hospital course.  DISCHARGE MEDICATIONS: 1. Amiodarone therapy 400 mg p.o. b.i.d. x7 days, and then 400 mg p.o.     daily thereafter. 2. Lisinopril 5 mg p.o. daily. 3. Enteric-coated aspirin 81 mg p.o. every other day. 4. Crestor 10 mg p.o. daily. 5. Finasteride 5 mg p.o. daily. 6. Furosemide 20 mg p.o. daily. 7. Glipizide 10 mg p.o. daily. 8. Imdur 30 mg p.o. daily. 9. Multivitamin 1 tab daily. 10.Sublingual nitroglycerin 0.4 mg q.5 minutes up to 3 doses p.r.n.     for chest discomfort. 11.Prednisone 5 mg daily. 12.Potassium chloride 10 mEq p.o. daily.  DURATION OF DISCHARGE ENCOUNTER INCLUDING PHYSICIAN TIME:  35 minutes.     Jarrett Ables, PAC   ______________________________ Duke Salvia, MD, [facility]    MS/MEDQ  D:  10/23/2010  T:  10/24/2010  Job:  [id number]  cc:   [name]. Riley Kill, MD, [facility] Duke Salvia, MD, [facility] Geoffry Paradise, M.D.  Electronically Signed by Jarrett Ables PAC on 10/31/2010 03:01:15 PM Electronically Signed by Sherryl Manges MD [facility] on 11/12/2010 09:57:44 PM

## 2010-11-13 ENCOUNTER — Encounter (INDEPENDENT_AMBULATORY_CARE_PROVIDER_SITE_OTHER): Payer: Self-pay | Admitting: *Deleted

## 2010-11-14 NOTE — Letter (Signed)
Summary: [facility] Office Visit Note   [facility] Office Visit Note   Imported By: Roderic Ovens 11/08/2010 12:38:32  _____________________________________________________________________  External Attachment:    Type:   Image     Comment:   External Document

## 2010-11-14 NOTE — Assessment & Plan Note (Addendum)
Summary: [facility]--schedule pt for follow-up BMP/lwb   Visit Type:  Post-hospital Primary Provider:  Jacky Kindle   History of Present Illness: Overall is doing better.  Came in with several days of volume overload, and evidence of atrial fibrillation with more rapid response.  Was seen be Dr. Graciela Husbands, and placed on Amiodarone.  Now with followup, and seems to be doing better.  Got an URI the other day, and is just starting to recover from this. Very hoarse.   Problems Prior to Update: 1)  Cad, Bypass Graft Ef 40-45%  (ICD-414.02) 2)  Hypertension, Benign  (ICD-401.1) 3)  Hypercholesterolemia Iia  (ICD-272.0) 4)  Diabetes Mellitus, Type II  (ICD-250.00) 5)  Atrial Tachycardia  (ICD-427.89) 6)  Atrial Fibrillation  (ICD-427.31) 7)  Sinus Bradycardia  (ICD-427.81) 8)  Renal Disease, Chronic, Stage II  (ICD-585.2) 9)  Edema  (ICD-782.3) 10)  Back Pain, Chronic  (ICD-724.5) 11)  Prostate Cancer, Hx of  (ICD-V10.46)  Current Medications (verified): 1)  Furosemide 20 Mg Tabs (Furosemide) .... Take 1 By Mouth Once Daily 2)  Imdur 30 Mg Xr24h-Tab (Isosorbide Mononitrate) .... Take 1 By Mouth Once Daily 3)  Nitroglycerin 0.4 Mg Subl (Nitroglycerin) .... Place 1 Tablet Under Tongue As Directed 4)  Potassium Chloride Crys Cr 10 Meq Cr-Tabs (Potassium Chloride Crys Cr) .... Take 1 By Mouth Once Daily 5)  Crestor 10 Mg Tabs (Rosuvastatin Calcium) .... Take One Tablet By Mouth Daily. 6)  Aspir-81 81 Mg Tbec (Aspirin) .Marland Kitchen.. 1 Tablet Every Other Day 7)  Daily Multiple Vitamins  Tabs (Multiple Vitamin) .Marland Kitchen.. 1 Tablet Every Day 8)  Glipizide 10 Mg Xr24h-Tab (Glipizide) .... Take 1 Tablet By Mouth Daily 9)  Finasteride 5 Mg Tabs (Finasteride) .... Take One Tablet By Mouth Once Daily. 10)  Prednisone 5 Mg Tabs (Prednisone) .... Take 1 Tablet By Mouth Once A Day 11)  Amiodarone Hcl 200 Mg Tabs (Amiodarone Hcl) .... Take 1 Tablet By Mouth Two Times A Day 12)  Lisinopril 5 Mg Tabs (Lisinopril) .... Take One Tablet By  Mouth Daily  Allergies (verified): No Known Drug Allergies  Vital Signs:  Patient profile:   77 year old male Height:      71 inches Weight:      218.50 pounds BMI:     30.58 Pulse rate:   75 / minute Pulse rhythm:   regular Resp:     18 per minute BP sitting:   126 / 60  (left arm) Cuff size:   large  Vitals Entered By: Vikki Ports (November 06, 2010 12:32 PM)  Physical Exam  General:  Well developed, well nourished, in no acute distress. Head:  normocephalic and atraumatic Eyes:  PERRLA/EOM intact; conjunctiva and lids normal. Lungs:  entirely clear to A and P Heart:  PMI non displaced.  Normal S1 and S2 without rub.  Pos S4 gallop.  Pulses:  pulses normal in all 4 extremities Extremities:  No clubbing or cyanosis. Neurologic:  Alert and oriented x 3.   EKG  Procedure date:  11/06/2010  Findings:      NSr.  Nonspecific ST abnormality. Borderline prolonged QTc.   Impression & Recommendations:  Problem # 1:  CAD,   BYPASS GRAFT EF 40-45% (ICD-414.02) stable at present time.  Now off of beta blockers, but on Amio. The following medications were removed from the medication list:    Metoprolol Tartrate 25 Mg Tabs (Metoprolol tartrate) .Marland Kitchen... Take 1/2 tablet by mouth twice a day    Amlodipine Besylate 10  Mg Tabs (Amlodipine besylate) .Marland Kitchen... Take one tablet by mouth daily His updated medication list for this problem includes:    Imdur 30 Mg Xr24h-tab (Isosorbide mononitrate) .Marland Kitchen... Take 1 by mouth once daily    Nitroglycerin 0.4 Mg Subl (Nitroglycerin) .Marland Kitchen... Place 1 tablet under tongue as directed    Aspir-81 81 Mg Tbec (Aspirin) .Marland Kitchen... 1 tablet every other day    Lisinopril 5 Mg Tabs (Lisinopril) .Marland Kitchen... Take one tablet by mouth daily  Orders: EKG w/ Interpretation (93000)  Problem # 2:  ATRIAL FIBRILLATION (ICD-427.31) now on NSR.  Will need to watch closely for brady. Was not sent home on Pradaxa, and will resume this medication for stroke prevention.  Recheck CBC  later in the week.  The following medications were removed from the medication list:    Tikosyn 250 Mcg Caps (Dofetilide) .Marland Kitchen... Take one tablet by mouth twice daily.    Metoprolol Tartrate 25 Mg Tabs (Metoprolol tartrate) .Marland Kitchen... Take 1/2 tablet by mouth twice a day His updated medication list for this problem includes:    Aspir-81 81 Mg Tbec (Aspirin) .Marland Kitchen... 1 tablet every other day    Amiodarone Hcl 200 Mg Tabs (Amiodarone hcl) .Marland Kitchen... Take 1 tablet by mouth two times a day  Problem # 3:  HYPERCHOLESTEROLEMIA  IIA (ICD-272.0) tolerating this well. His updated medication list for this problem includes:    Crestor 10 Mg Tabs (Rosuvastatin calcium) .Marland Kitchen... Take one tablet by mouth daily.  Problem # 4:  HYPERTENSION, BENIGN (ICD-401.1) well controlled today. The following medications were removed from the medication list:    Metoprolol Tartrate 25 Mg Tabs (Metoprolol tartrate) .Marland Kitchen... Take 1/2 tablet by mouth twice a day    Amlodipine Besylate 10 Mg Tabs (Amlodipine besylate) .Marland Kitchen... Take one tablet by mouth daily His updated medication list for this problem includes:    Furosemide 20 Mg Tabs (Furosemide) .Marland Kitchen... Take 1 by mouth once daily    Aspir-81 81 Mg Tbec (Aspirin) .Marland Kitchen... 1 tablet every other day    Lisinopril 5 Mg Tabs (Lisinopril) .Marland Kitchen... Take one tablet by mouth daily  Patient Instructions: 1)  Your physician recommends that you schedule a follow-up appointment in: 2 weeks with Dr. Riley Kill 2)  Your physician recommends that you continue on your current medications as directed. Please refer to the Current Medication list given to you today. 3)  Your physician recommends that you return for lab work in 2 weeks bmet, liver  272.0, 414.04/dfg 4)  Restart Pradaxa

## 2010-11-20 NOTE — Miscellaneous (Signed)
Summary: update med  Clinical Lists Changes  Medications: Added new medication of PRADAXA 150 MG CAPS (DABIGATRAN ETEXILATE MESYLATE) Take 1 tablet by mouth two times a day

## 2010-11-20 NOTE — Progress Notes (Signed)
Summary: [facility]: Office Visit   [facility]   Imported By: Earl Many 11/16/2010 09:31:17  _____________________________________________________________________  External Attachment:    Type:   Image     Comment:   External Document

## 2010-11-26 ENCOUNTER — Telehealth: Payer: Self-pay | Admitting: Cardiology

## 2010-11-29 NOTE — Procedures (Signed)
Summary: summary report  summary report   Imported By: Mirna Mires 11/21/2010 09:58:30  _____________________________________________________________________  External Attachment:    Type:   Image     Comment:   External Document

## 2010-11-30 ENCOUNTER — Ambulatory Visit (INDEPENDENT_AMBULATORY_CARE_PROVIDER_SITE_OTHER): Payer: Medicare Other | Admitting: Cardiology

## 2010-11-30 ENCOUNTER — Encounter: Payer: Self-pay | Admitting: Cardiology

## 2010-11-30 DIAGNOSIS — I4891 Unspecified atrial fibrillation: Secondary | ICD-10-CM

## 2010-11-30 DIAGNOSIS — I10 Essential (primary) hypertension: Secondary | ICD-10-CM

## 2010-11-30 DIAGNOSIS — R195 Other fecal abnormalities: Secondary | ICD-10-CM

## 2010-11-30 DIAGNOSIS — I2510 Atherosclerotic heart disease of native coronary artery without angina pectoris: Secondary | ICD-10-CM

## 2010-11-30 DIAGNOSIS — Z8546 Personal history of malignant neoplasm of prostate: Secondary | ICD-10-CM

## 2010-11-30 DIAGNOSIS — I495 Sick sinus syndrome: Secondary | ICD-10-CM

## 2010-11-30 DIAGNOSIS — M549 Dorsalgia, unspecified: Secondary | ICD-10-CM

## 2010-12-04 NOTE — Progress Notes (Signed)
Summary:  confirm referral  Phone Note Call from Patient Call back at Work Phone [phone]   Caller: Patient Reason for Call: Talk to Nurse, Talk to Doctor Summary of Call: pt forgot the referal Dr. Riley Kill gave him so he wants to talk to you so he can make sure he has the information correct Initial call taken by: Omer Jack,  November 26, 2010 10:53 AM  Follow-up for Phone Call        I was out of the office on 11/26/10.  Left message for pt at work to call back. Julieta Gutting, RN, BSN  November 27, 2010 9:26 AM  I spoke with the pt and he previously had an MRI of his back. Dr Channing Mutters had reviewed this test but the pt did not see him for an appt.  Dr Channing Mutters is no longer part of [facility] and the pt thinks Dr Riley Kill may have mentioned Dr Venetia Maxon in the past.  The pt is scheduled to see Dr Riley Kill on 11/30/10 and we will clarify this information at that time.   Follow-up by: Julieta Gutting, RN, BSN,  November 27, 2010 9:57 AM

## 2010-12-10 LAB — CONVERTED CEMR LAB
BUN: 18 mg/dL (ref 6–23)
Basophils Relative: 0 % (ref 0–1)
Chloride: 102 meq/L (ref 96–112)
Free T4: 1.27 ng/dL (ref 0.80–1.80)
Glucose, Bld: 105 mg/dL — ABNORMAL HIGH (ref 70–99)
Lymphs Abs: 2.7 10*3/uL (ref 0.7–4.0)
Monocytes Relative: 9 % (ref 3–12)
Neutro Abs: 5.9 10*3/uL (ref 1.7–7.7)
Neutrophils Relative %: 61 % (ref 43–77)
Potassium: 5.2 meq/L (ref 3.5–5.3)
RBC: 4.96 M/uL (ref 4.22–5.81)
Sodium: 138 meq/L (ref 135–145)
WBC: 9.8 10*3/uL (ref 4.0–10.5)

## 2010-12-17 ENCOUNTER — Other Ambulatory Visit: Payer: Self-pay | Admitting: Cardiology

## 2010-12-20 NOTE — Assessment & Plan Note (Signed)
Summary: 2 week follow-up/dfg--refer to Neurosurgeon/lwb   Visit Type:  Follow-up Primary Provider:  Jacky Kindle  CC:  back problems. no other complaints today.Shawn [name]  History of Present Illness: Back is still bothering him otherwise getting along pretty well.  No chest pain. He also has less appetite.   Current Medications (verified): 1)  Furosemide 20 Mg Tabs (Furosemide) .... Take 1 By Mouth Once Daily 2)  Imdur 30 Mg Xr24h-Tab (Isosorbide Mononitrate) .... Take 1 By Mouth Once Daily 3)  Nitroglycerin 0.4 Mg Subl (Nitroglycerin) .... Place 1 Tablet Under Tongue As Directed 4)  Potassium Chloride Crys Cr 10 Meq Cr-Tabs (Potassium Chloride Crys Cr) .... Take 1 By Mouth Once Daily 5)  Crestor 10 Mg Tabs (Rosuvastatin Calcium) .... Take One Tablet By Mouth Daily. 6)  Aspir-81 81 Mg Tbec (Aspirin) .Shawn [name].. 1 Tablet Every Other Day 7)  Daily Multiple Vitamins  Tabs (Multiple Vitamin) .Shawn [name].. 1 Tablet Every Day 8)  Glipizide 10 Mg Xr24h-Tab (Glipizide) .... Take 1 Tablet By Mouth Daily 9)  Finasteride 5 Mg Tabs (Finasteride) .... Take One Tablet By Mouth Once Daily. 10)  Prednisone 5 Mg Tabs (Prednisone) .... Take 1 Tablet By Mouth Once A Day 11)  Amiodarone Hcl 200 Mg Tabs (Amiodarone Hcl) .... Take 1 Tablet By Mouth Two Times A Day 12)  Lisinopril 5 Mg Tabs (Lisinopril) .... Take One Tablet By Mouth Daily 13)  Pradaxa 150 Mg Caps (Dabigatran Etexilate Mesylate) .... Take 1 Tablet By Mouth Two Times A Day  Allergies (verified): No Known Drug Allergies  Past History:  Past Medical History: Last updated: 12/07/2009 Current Problems:  CAD,   BYPASS GRAFT EF 40-45% (ICD-414.02) HYPERTENSION, BENIGN (ICD-401.1) HYPERCHOLESTEROLEMIA  IIA (ICD-272.0) DIABETES MELLITUS, TYPE II (ICD-250.00) ATRIAL TACHYCARDIA (ICD-427.89) ATRIAL FIBRILLATION (ICD-427.31) SINUS BRADYCARDIA (ICD-427.81) RENAL DISEASE, CHRONIC, STAGE II (ICD-585.2) EDEMA (ICD-782.3) BACK PAIN, CHRONIC (ICD-724.5) PROSTATE CANCER, HX OF  (ICD-V10.46)  Vital Signs:  Patient profile:   77 year old male Height:      71 inches Weight:      214 pounds BMI:     29.95 Pulse rate:   70 / minute Resp:     18 per minute BP sitting:   160 / 88  (left arm) Cuff size:   large  Vitals Entered By: Celestia Khat, CMA (November 30, 2010 2:48 PM)  Physical Exam  General:  Well developed, well nourished, in no acute distress. Head:  normocephalic and atraumatic Eyes:  PERRLA/EOM intact; conjunctiva and lids normal. Lungs:  Clear bilaterally to auscultation and percussion. Heart:  PMI non displaced.  Soft apical murmur. Pulses:  pulses normal in all 4 extremities Extremities:  No clubbing or cyanosis. Neurologic:  Alert and oriented x 3.   EKG  Procedure date:  11/30/2010  Findings:      NSR.  Prolonged QT, not changed from prior tracing.  Impression & Recommendations:  Problem # 1:  CAD,   BYPASS GRAFT EF 40-45% (ICD-414.02)  No recurrent symptoms at this point His updated medication list for this problem includes:    Imdur 30 Mg Xr24h-tab (Isosorbide mononitrate) .Shawn [name]... Take 1 by mouth once daily    Nitroglycerin 0.4 Mg Subl (Nitroglycerin) .Shawn [name]... Place 1 tablet under tongue as directed    Aspir-81 81 Mg Tbec (Aspirin) .Shawn [name]... 1 tablet every other day    Lisinopril 5 Mg Tabs (Lisinopril) .Shawn [name]... Take one tablet by mouth daily  Orders: TLB-BMP (Basic Metabolic Panel-BMET) (80048-METABOL) TLB-CBC Platelet - w/Differential (85025-CBCD) TLB-Hemoccult Slides (Occult Bld) (82270-HEMO)  TLB-TSH (Thyroid Stimulating Hormone) (84443-TSH) TLB-T4 (Thyrox), Free [phone])  Problem # 2:  ATRIAL FIBRILLATION (ICD-427.31)  maintaining NSR. cutting down Amio to once daily. His updated medication list for this problem includes:    Aspir-81 81 Mg Tbec (Aspirin) .Shawn [name]... 1 tablet every other day    Amiodarone Hcl 200 Mg Tabs (Amiodarone hcl) .Shawn [name]... Take 1 tablet by mouth daily  Orders: TLB-BMP (Basic Metabolic Panel-BMET)  (80048-METABOL) TLB-CBC Platelet - w/Differential (85025-CBCD) TLB-Hemoccult Slides (Occult Bld) (82270-HEMO) TLB-TSH (Thyroid Stimulating Hormone) (84443-TSH) TLB-T4 (Thyrox), Free [phone])  Problem # 3:  HYPERTENSION, BENIGN (ICD-401.1)  controlled at present. His updated medication list for this problem includes:    Furosemide 20 Mg Tabs (Furosemide) .Shawn [name]... Take 1 by mouth once daily    Aspir-81 81 Mg Tbec (Aspirin) .Shawn [name]... 1 tablet every other day    Lisinopril 5 Mg Tabs (Lisinopril) .Shawn [name]... Take one tablet by mouth daily  Orders: TLB-BMP (Basic Metabolic Panel-BMET) (80048-METABOL) TLB-CBC Platelet - w/Differential (85025-CBCD) TLB-Hemoccult Slides (Occult Bld) (82270-HEMO) TLB-TSH (Thyroid Stimulating Hormone) (84443-TSH) TLB-T4 (Thyrox), Free [phone])  Problem # 4:  EDEMA (ICD-782.3) not prominent.  Problem # 5:  BACK PAIN (ICD-724.5) encouraged patient to call back NS office and get appointment.   Problem # 6:  darker stools will check stool cards.   Patient Instructions: 1)  Your physician recommends that you schedule a follow-up appointment in: 6 weeks with Dr Riley Kill 2)  Your physician recommends that you return for lab work today  CBC/TSH/FreeT4/BMP/Stool Cards 3)  Your physician has recommended you make the following change in your medication: DECREASE Amiodarone to one by mouth daily

## 2010-12-20 NOTE — Telephone Encounter (Signed)
[address] °

## 2010-12-27 ENCOUNTER — Other Ambulatory Visit: Payer: Self-pay | Admitting: *Deleted

## 2010-12-27 MED ORDER — LISINOPRIL 5 MG PO TABS: 5.0000 mg | ORAL_TABLET | Freq: Every day | ORAL | Status: DC

## 2010-12-28 LAB — GLUCOSE, CAPILLARY
Glucose-Capillary: 100 mg/dL — ABNORMAL HIGH (ref 70–99)
Glucose-Capillary: 106 mg/dL — ABNORMAL HIGH (ref 70–99)
Glucose-Capillary: 120 mg/dL — ABNORMAL HIGH (ref 70–99)
Glucose-Capillary: 123 mg/dL — ABNORMAL HIGH (ref 70–99)
Glucose-Capillary: 126 mg/dL — ABNORMAL HIGH (ref 70–99)
Glucose-Capillary: 134 mg/dL — ABNORMAL HIGH (ref 70–99)
Glucose-Capillary: 148 mg/dL — ABNORMAL HIGH (ref 70–99)
Glucose-Capillary: 149 mg/dL — ABNORMAL HIGH (ref 70–99)
Glucose-Capillary: 153 mg/dL — ABNORMAL HIGH (ref 70–99)
Glucose-Capillary: 159 mg/dL — ABNORMAL HIGH (ref 70–99)
Glucose-Capillary: 162 mg/dL — ABNORMAL HIGH (ref 70–99)
Glucose-Capillary: 210 mg/dL — ABNORMAL HIGH (ref 70–99)
Glucose-Capillary: 215 mg/dL — ABNORMAL HIGH (ref 70–99)
Glucose-Capillary: 95 mg/dL (ref 70–99)

## 2010-12-28 LAB — CBC
HCT: 44 % (ref 39.0–52.0)
Hemoglobin: 15.3 g/dL (ref 13.0–17.0)
Hemoglobin: 15.4 g/dL (ref 13.0–17.0)
Hemoglobin: 15.6 g/dL (ref 13.0–17.0)
MCHC: 34.3 g/dL (ref 30.0–36.0)
MCV: 86.7 fL (ref 78.0–100.0)
MCV: 88.5 fL (ref 78.0–100.0)
Platelets: 183 10*3/uL (ref 150–400)
Platelets: 185 10*3/uL (ref 150–400)
Platelets: 198 10*3/uL (ref 150–400)
RBC: 4.82 MIL/uL (ref 4.22–5.81)
RBC: 5.13 MIL/uL (ref 4.22–5.81)
RDW: 13.6 % (ref 11.5–15.5)
RDW: 13.9 % (ref 11.5–15.5)
RDW: 13.9 % (ref 11.5–15.5)
WBC: 10.3 10*3/uL (ref 4.0–10.5)
WBC: 12.1 10*3/uL — ABNORMAL HIGH (ref 4.0–10.5)
WBC: 9 10*3/uL (ref 4.0–10.5)
WBC: 9.3 10*3/uL (ref 4.0–10.5)

## 2010-12-28 LAB — URINALYSIS, ROUTINE W REFLEX MICROSCOPIC
Nitrite: NEGATIVE
Specific Gravity, Urine: 1.019 (ref 1.005–1.030)
Urobilinogen, UA: 1 mg/dL (ref 0.0–1.0)
pH: 5.5 (ref 5.0–8.0)

## 2010-12-28 LAB — POCT CARDIAC MARKERS
CKMB, poc: <1 ng/mL — ABNORMAL LOW (ref 1.0–8.0)
Myoglobin, poc: 93 ng/mL (ref 12–200)
Troponin i, poc: <0.05 ng/mL (ref 0.00–0.09)
Troponin i, poc: <0.05 ng/mL (ref 0.00–0.09)

## 2010-12-28 LAB — BASIC METABOLIC PANEL
BUN: 14 mg/dL (ref 6–23)
BUN: 14 mg/dL (ref 6–23)
CO2: 25 mEq/L (ref 19–32)
Calcium: 8.9 mg/dL (ref 8.4–10.5)
Calcium: 8.9 mg/dL (ref 8.4–10.5)
Calcium: 9 mg/dL (ref 8.4–10.5)
Chloride: 105 mEq/L (ref 96–112)
Chloride: 108 mEq/L (ref 96–112)
GFR calc Af Amer: 51 mL/min — ABNORMAL LOW (ref >60)
GFR calc Af Amer: >60 mL/min (ref >60)
GFR calc Af Amer: >60 mL/min (ref >60)
GFR calc non Af Amer: 53 mL/min — ABNORMAL LOW (ref >60)
GFR calc non Af Amer: 57 mL/min — ABNORMAL LOW (ref >60)
GFR calc non Af Amer: >60 mL/min (ref >60)
Glucose, Bld: 118 mg/dL — ABNORMAL HIGH (ref 70–99)
Glucose, Bld: 126 mg/dL — ABNORMAL HIGH (ref 70–99)
Potassium: 3.8 mEq/L (ref 3.5–5.1)
Potassium: 3.9 mEq/L (ref 3.5–5.1)
Potassium: 4 mEq/L (ref 3.5–5.1)
Sodium: 140 mEq/L (ref 135–145)
Sodium: 140 mEq/L (ref 135–145)
Sodium: 140 mEq/L (ref 135–145)
Sodium: 143 mEq/L (ref 135–145)

## 2010-12-28 LAB — PROTIME-INR
INR: 0.9 (ref 0.00–1.49)
INR: 1.1 (ref 0.00–1.49)
INR: 2.4 — ABNORMAL HIGH (ref 0.00–1.49)
Prothrombin Time: 14.5 seconds (ref 11.6–15.2)
Prothrombin Time: 21.1 seconds — ABNORMAL HIGH (ref 11.6–15.2)
Prothrombin Time: 26.3 seconds — ABNORMAL HIGH (ref 11.6–15.2)

## 2010-12-28 LAB — TSH: TSH: 8.544 u[IU]/mL — ABNORMAL HIGH (ref 0.350–4.500)

## 2010-12-28 LAB — DIFFERENTIAL
Basophils Absolute: 0.1 10*3/uL (ref 0.0–0.1)
Basophils Relative: 1 % (ref 0–1)
Lymphocytes Relative: 35 % (ref 12–46)
Neutro Abs: 4.7 10*3/uL (ref 1.7–7.7)
Neutrophils Relative %: 50 % (ref 43–77)

## 2010-12-28 LAB — BRAIN NATRIURETIC PEPTIDE: Pro B Natriuretic peptide (BNP): 501 pg/mL — ABNORMAL HIGH (ref 0.0–100.0)

## 2010-12-28 LAB — LIPID PANEL
LDL Cholesterol: 58 mg/dL (ref 0–99)
Triglycerides: 110 mg/dL (ref <150)

## 2010-12-28 LAB — PHOSPHORUS: Phosphorus: 4.1 mg/dL (ref 2.3–4.6)

## 2010-12-31 ENCOUNTER — Other Ambulatory Visit: Payer: Self-pay | Admitting: Cardiology

## 2010-12-31 ENCOUNTER — Other Ambulatory Visit: Payer: Medicare Other

## 2010-12-31 LAB — HEMOCCULT SLIDES (X 3 CARDS)
OCCULT 1: NEGATIVE
OCCULT 3: NEGATIVE
OCCULT 4: NEGATIVE

## 2011-01-03 LAB — BASIC METABOLIC PANEL
CO2: 31 mEq/L (ref 19–32)
Calcium: 9.2 mg/dL (ref 8.4–10.5)
Chloride: 105 mEq/L (ref 96–112)
Creatinine, Ser: 1.23 mg/dL (ref 0.4–1.5)
GFR calc Af Amer: >60 mL/min (ref >60)
Glucose, Bld: 87 mg/dL (ref 70–99)

## 2011-01-03 LAB — POCT HEMOGLOBIN-HEMACUE: Hemoglobin: 16.1 g/dL (ref 13.0–17.0)

## 2011-01-03 LAB — GLUCOSE, CAPILLARY: Glucose-Capillary: 68 mg/dL — ABNORMAL LOW (ref 70–99)

## 2011-01-05 ENCOUNTER — Encounter: Payer: Self-pay | Admitting: Cardiology

## 2011-01-08 ENCOUNTER — Encounter: Payer: Self-pay | Admitting: Cardiology

## 2011-01-08 ENCOUNTER — Ambulatory Visit (INDEPENDENT_AMBULATORY_CARE_PROVIDER_SITE_OTHER): Payer: Medicare Other | Admitting: Cardiology

## 2011-01-08 DIAGNOSIS — I10 Essential (primary) hypertension: Secondary | ICD-10-CM

## 2011-01-08 DIAGNOSIS — I4891 Unspecified atrial fibrillation: Secondary | ICD-10-CM

## 2011-01-08 DIAGNOSIS — I2589 Other forms of chronic ischemic heart disease: Secondary | ICD-10-CM

## 2011-01-08 DIAGNOSIS — I2510 Atherosclerotic heart disease of native coronary artery without angina pectoris: Secondary | ICD-10-CM

## 2011-01-08 DIAGNOSIS — I25810 Atherosclerosis of coronary artery bypass graft(s) without angina pectoris: Secondary | ICD-10-CM

## 2011-01-08 DIAGNOSIS — E7800 Pure hypercholesterolemia, unspecified: Secondary | ICD-10-CM

## 2011-01-08 LAB — BASIC METABOLIC PANEL
BUN: 16 mg/dL (ref 6–23)
CO2: 27 mEq/L (ref 19–32)
Chloride: 103 mEq/L (ref 96–112)
Creatinine, Ser: 1.4 mg/dL (ref 0.4–1.5)
Potassium: 4.8 mEq/L (ref 3.5–5.1)

## 2011-01-08 NOTE — Patient Instructions (Signed)
Your physician recommends that you have lab work today: [facility]  Your physician recommends that you continue on your current medications as directed. Please refer to the Current Medication list given to you today.  Your physician recommends that you schedule a follow-up appointment in: 2 MONTHS

## 2011-01-17 NOTE — Assessment & Plan Note (Signed)
BP slightly elevated. Check at home.  There is room to move with his ACE inhibitor.

## 2011-01-17 NOTE — Assessment & Plan Note (Signed)
No current angina.  He has been stable for awhile now, and tolerates his medications.

## 2011-01-17 NOTE — Assessment & Plan Note (Addendum)
Pulse is regular.  Currently on amiodarone, and the dose has been cut down to once daily.  Thyroid recently adjusted by Dr. Jacky Kindle.  LFTS ok in February.  Remains on Pradaxa with bruised arms  (on Prednisone), but stools are negative and hemoglobin ok at last check.

## 2011-01-17 NOTE — Assessment & Plan Note (Signed)
Last LDL was 41.  Would not make a change at the present time.

## 2011-01-17 NOTE — Progress Notes (Signed)
HPI: [name] is in for a follow up visit.  In general he is stable.  He is taking Prednisone, and seeing Dr. Adriana Reams for his back. He likes Dr. Newell Coral.  He has had a lot of bruising on his arms.  He denies any chest pain or shortness of breath that is progressive. He is being followed by Dr. Jacky Kindle now.  They have adjusted his thyroid medication.  Current Outpatient Prescriptions  Medication Sig Dispense Refill  . amiodarone (PACERONE) 200 MG tablet Take 200 mg by mouth daily.        Marland Kitchen aspirin 81 MG tablet Take 81 mg by mouth daily.        . dabigatran (PRADAXA) 150 MG CAPS Take 150 mg by mouth every 12 (twelve) hours.        . finasteride (PROSCAR) 5 MG tablet Take 1 tablet by mouth Daily.      . furosemide (LASIX) 20 MG tablet take 1 tablet by mouth once daily  30 tablet  2  . glipiZIDE (GLUCOTROL) 10 MG tablet Take 10 mg by mouth daily.        . isosorbide mononitrate (IMDUR) 30 MG 24 hr tablet Take 30 mg by mouth daily.        Marland Kitchen levothyroxine (SYNTHROID, LEVOTHROID) 25 MCG tablet Take 1 tablet by mouth Daily.      . nitroGLYCERIN (NITROSTAT) 0.4 MG SL tablet Place 0.4 mg under the tongue every 5 (five) minutes as needed.        . potassium chloride (KLOR-CON) 10 MEQ CR tablet Take 10 mEq by mouth daily.        . predniSONE (DELTASONE) 5 MG tablet Take 5 mg by mouth daily.        . rosuvastatin (CRESTOR) 10 MG tablet Take 10 mg by mouth daily.        Marland Kitchen KLOR-CON M10 10 MEQ tablet take 1 tablet by mouth once daily  30 tablet  2  . lisinopril (PRINIVIL,ZESTRIL) 5 MG tablet Take 1 tablet (5 mg total) by mouth daily.  30 tablet  6    No Known Allergies  Past Medical History  Diagnosis Date  . CAD (coronary artery disease)      BYPASS GRAFT EF 40-45% (ICD-414.02)  . HTN (hypertension)   . Hypercholesteremia   . Diabetes mellitus   . Atrial fibrillation   . Tachycardia   . Sinus bradycardia   . Renal disease   . Edema   . Back pain   . Prostate cancer     Past Surgical History    Procedure Date  . Coronary artery bypass graft 1990    No family history on file.  History   Social History  . Marital Status: Widowed    Spouse Name: N/A    Number of Children: N/A  . Years of Education: N/A   Occupational History  . Not on file.   Social History Main Topics  . Smoking status: Former Games developer  . Smokeless tobacco: Not on file   Comment: quit 20+ years ago  . Alcohol Use: No  . Drug Use: No  . Sexually Active: Not on file   Other Topics Concern  . Not on file   Social History Narrative     The patient resides in [place] alone.  He is  widowed.  He has 4 children, 9 grandchildren.   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  He denies any alcohol, drugs, or herbal medication.  He tries  to maintain a low-fat diet.  He states that he does exercise somewhat  with walking, and he uses a stationary bike for a few minutes every  other day.       ROS: Please see the HPI.  All other systems reviewed and negative.  PHYSICAL EXAM:  BP 152/70  Pulse 66  Ht 5\' 11"  (1.803 m)  Wt 212 lb 6.4 oz (96.344 kg)  BMI 29.62 kg/m2  General: Well developed, well nourished, in no acute distress. Head:  Normocephalic and atraumatic. Neck: no JVD Lungs: Clear to auscultation and percussion. Heart: Normal S1 and S2.  Apical murmur. Abdomen:  Normal bowel sounds; soft; non tender; no organomegaly Pulses: Pulses normal in all 4 extremities. Extremities: No clubbing or cyanosis. No edema. Neurologic: Alert and oriented x 3.  EKG:  ASSESSMENT AND PLAN:

## 2011-01-19 ENCOUNTER — Other Ambulatory Visit: Payer: Self-pay | Admitting: Cardiology

## 2011-01-19 DIAGNOSIS — I4891 Unspecified atrial fibrillation: Secondary | ICD-10-CM

## 2011-01-30 ENCOUNTER — Telehealth: Payer: Self-pay

## 2011-01-30 NOTE — Telephone Encounter (Signed)
The patient has coronary heart disease, and has had atrial fibrillation.  He is on Pradaxa for stroke prophylaxis.  If it is determined by the surgeon that this is needed  Pradaxa can be stopped for 72 hours prior to the procedure.  There is a small increased risk of stroke associated with discontinuation of Pradaxa, guided by the surgical assessment of risks versus benefit for the patient.  He has been stable at present from the standpoint of his coronary artery disease.  Volume should be watched during the procedure to avoid fluid overload, as well as oxygen saturation, if conscious sedation is used. Call for questions.  Bonnee Quin MD.

## 2011-01-30 NOTE — Telephone Encounter (Addendum)
Dr Dorcas Carrow (oral surgeon) called about this pt needing dental extraction times two under local anesthesia and bone grafts for dental implants.  He would like cardiac clearance for dental procedure.  The pt is also taking Pradaxa and he would like Dr Riley Kill to determine if this medication can be held and for how long prior to procedure. Office [phone].  I will forward this information to Dr Riley Kill for review.

## 2011-01-31 NOTE — Telephone Encounter (Signed)
Note from Dr Riley Kill faxed to [phone].

## 2011-02-05 NOTE — Cardiovascular Report (Signed)
Shawn [name], [name] NO.:  [id number]   MEDICAL RECORD NO.:  [id number]          PATIENT TYPE:  INP   LOCATION:  2905                         FACILITY:  [facility]   PHYSICIAN:  [name]. Riley Kill, MD, FACCDATE OF BIRTH:  11-11-1927   DATE OF PROCEDURE:  DATE OF DISCHARGE:                            CARDIAC CATHETERIZATION   INDICATIONS:  Shawn [name] is a very pleasant 77 year old gentleman who  underwent revascularization surgery in 1990. He has diabetes. In 2005 he  underwent cardiac catheterization which revealed mild disease of the  saphenous vein grafts, and some diffuse distal disease beyond the LAD  insertion.  He has been treated medically.  Recently he has had back  pain, and recently came off of aspirin and Plavix for injections.  He  presented to the emergency room tonight with chest pain and this was  followed by ventricular fibrillation.  He also came to the cath lab  emergently after being resuscitated, and had ventricular fibrillation  here as well.  Cardiac catheterization was felt to be indicated.   PROCEDURE:  1. Left heart catheterization.  2. Selective coronary arteriography.  3. Selective left ventriculography.  4. Saphenous vein graft angiography.  5. Internal mammary angiography.  6. Percutaneous stenting of the saphenous vein graft to the obtuse      marginal using a spider distal protection device.   DESCRIPTION OF PROCEDURE:  The patient had ventricular fibrillation in  the emergency room and was resuscitated.  He also had ventricular  fibrillation in the catheterization laboratory requiring two shocks.  Subsequently the right femoral artery was entered using a 6-French  sheath.  The patient was noted to be hypotensive.  Fluids and dopamine  were started.  Following this, a JR-4 guiding catheter was utilized to  intubate the saphenous vein grafts. Heparin had been given, and then  bivalirudin was given according to protocol.  The patient  received  aspirin in the emergency room and also had received oral clopidogrel in  the catheterization laboratory in the form of 600 mg.  With an adequate  ACT, he was noted to have an occluded vein graft to the obtuse marginal.  This was quickly crossed with a Prowater wire.  Dilatations were done  with a 2.25 and two 5-mm balloons.  We then placed a spider 4-0 distal  protection device down into the distal vessel.  Adequate protection was  achieved. The vessel was then stented with a 3.0 x 23 Vision.  Following  this, we went back in and then did a left coronary angiogram, and this  was followed by central aortic and left ventricular pressures.  Ventriculography was performed in the RAO projection.  By the completion  of the procedure, we were able to back down on the dopamine drip to just  3 mcg. The patient had some nausea and did require Zofran.  Overall,  after stabilization the patient tolerated the procedure well.   HEMODYNAMIC DATA:  1. Central aortic pressure was 73/37 with a mean of 54 at the      beginning of the procedure, intravenous dopamine  was started.  2. Left ventricular pressure after completion of the reperfusion was      113/25.  There was no gradient on pullback across the aortic valve.   ANGIOGRAPHIC DATA.:  1. A hand injection was done into the left ventricle.  There seemed to      be reasonable ventricular motion.  We were not able to opacify      completely and I have purposely limited this because of the      creatinine of 1.7.  2. The left main coronary artery is severely diseased.  There is 80-      90% distal narrowing. The circumflex that is totally occluded after      a tiny marginal. The LAD itself is severely diseased and then the      vessel opens up and then is more less subtotally occluded again      after a big septal perforator.  3. The internal mammary to the distal LAD does appear to be intact but      the distal LAD is a small,  diabetic-appearing, diffusely diseased      vessel.  4. The saphenous vein graft to the obtuse marginal branch has its      previous 50% ostial eccentric plaquing. In the mid portion of the      vessel, there is a total occlusion.  Following reperfusion, this is      reduced to 0%.  There is TIMI 3 flow into the distal vessel and no      evidence of distal embolization.  5. The right coronary artery is known to be occluded.  6. The saphenous vein graft to the right coronary artery is severely      diseased with about a 95% area of focal stenosis at its __________      into the crux and then the PDA has probably 90% stenosis. The      posterolateral branch is only modest in size.   CONCLUSION:  1. Total occlusion of the saphenous vein graft to the obtuse marginal      with successful reperfusion therapy using a 3.0 x 23 non drug-      eluting stent, and distal protection.  2. Continued patency of the internal mammary to the LAD with a      diffusely diseased LAD.  3. Continued patency of the saphenous vein graft to the distal right      with high-grade saphenous vein graft disease.  4. Known total occlusion of the circumflex, right coronary artery and      left anterior descending arteries.   DISPOSITION:  The patient will be treated medically.  I will have Dr.  Tyrone Sage review his films.  Options include an attempt at percutaneous  intervention of the right graft versus a redo surgery.  The patient does  have advanced age, diabetes and renal insufficiency so there are some  risks involved.      [name]. Riley Kill, MD, [facility]  Electronically Signed     TDS/MEDQ  D:  06/03/2007  T:  06/03/2007  Job:  [id number]   cc:   Quita Skye. Artis Flock, M.D.  Sheliah Plane, MD  [facility]

## 2011-02-05 NOTE — Assessment & Plan Note (Signed)
[facility]                            CARDIOLOGY OFFICE NOTE   AVROM, ROBARTS                        MRN:          [id number]  DATE:08/13/2007                            DOB:          [birth year]    Shawn [name] returns in cardiac follow-up.  He is in the cardiac rehab  program.  Recently, the patient went to rehab not feeling well.  He  developed a little bit of mid back pain which he took a nitroglycerin  for and it eased off over several minutes.  He has not had recurrent  symptoms since that time and actually has been feeling better.  His  blood pressure was elevated at that time.  He has also been worried to  some extent about the current economic conditions.   MEDICATIONS:  1. Glipizide ER 10 mg daily.  2. Crestor 10 mg daily.  3. Plavix 75 mg daily.  4. Isosorbide mononitrate 30 mg daily.  5. Potassium chloride 10 mEq daily.  6. Furosemide 20 mg daily.  7. Metoprolol ER 50 mg daily.  8. Aspirin 325 mg daily.  9. Multivitamin.   PHYSICAL EXAMINATION:  VITAL SIGNS:  Blood pressure 146/88, pulse 68.  GENERAL APPEARANCE:  He appears well and in no distress.  LUNGS:  Clear.  CARDIOVASCULAR:  Regular rhythm.  There is a soft apical murmur.   The electrocardiogram demonstrates normal sinus rhythm, T-wave inversion  is noted in I, II, aVL and V4-V6 as noted on previous tracings.   IMPRESSION:  1. Coronary artery disease with history of saphenous vein graft      disease, acute myocardial infarction and successful reperfusion.  2. Successful reperfusion of a saphenous vein graft to the right      coronary artery with subsequent dilatation of the PDA insertion.  3. Hypercholesterolemia.  4. Non-insulin-dependent diabetes mellitus.  5. Mild left ventricular dysfunction.   RECOMMENDATIONS:  1. Continue current medical regimen.  2. Early follow-up to reassess clinical situation.  3. The patient is not sleeping well and I have suggested that he  talk      with Dr. Artis Flock about      possible options regarding this.  4. If there is any change in symptoms, the patient is to contact me      promptly.     Shawn [name]. Shawn Kill, MD, [facility]  Electronically Signed    TDS/MedQ  DD: 08/13/2007  DT: 08/14/2007  Job #: [id number]   cc:   Shawn [name]. Artis Flock, M.D.

## 2011-02-05 NOTE — Assessment & Plan Note (Signed)
[facility]                            CARDIOLOGY OFFICE NOTE   Shawn [name], Shawn [name]                        MRN:          [id number]  DATE:09/25/2007                            DOB:          Aug 03, 1928    Shawn [name] is in for follow up.  In general he has been really quite  stable. He feels really quite good.  He has not been having any ongoing  chest pain. He has been in the cardiac rehab program, and his blood  pressure has been somewhat higher.  We have been following him closely  because of his recent complicated course.  The patient does not smoke  and he has not smoked in nearly 20 years, having smoked about 2 packs  per day prior to this time.   Today on examination the blood pressure is 124/64, the pulse is 76, the  lung fields are clear and the cardiac rhythm is regular.   Review of his recent rehab data shows a blood pressure sometimes up as  high as 200/80 although these are with exercises.  He says that when he  cools down it is relatively normal.   Based on this we will continue him on current medical regimen except for  increasing the Norvasc to 7.5 mg a day.  I have warned him that it could  be associated with increased swelling in the feet.  He will see [place] back  in followup within 2 months.     [name]. Riley Kill, MD, [facility]  Electronically Signed    TDS/MedQ  DD: 09/25/2007  DT: 09/25/2007  Job #: [id number]

## 2011-02-05 NOTE — Cardiovascular Report (Signed)
NAMEAARIV, MEDLOCK                 ACCOUNT NO.:  [id number]   MEDICAL RECORD NO.:  [id number]          PATIENT TYPE:  INP   LOCATION:  6529                         FACILITY:  [facility]   PHYSICIAN:  Veverly Fells. Excell Seltzer, MD  DATE OF BIRTH:  December 23, 1927   DATE OF PROCEDURE:  07/07/2007  DATE OF DISCHARGE:                            CARDIAC CATHETERIZATION   PROCEDURE:  1. Selective coronary angiography.  2. Saphenous vein graft angiography.  3. Left internal mammary artery angiography.  4. Percutaneous transluminal cardiac angioplasty and drug-eluting      stent placement in the right posterior descending artery.   INDICATIONS:  Mr. Borner is a 77 year old gentleman who has been closely  followed by Dr. Riley Kill.  He had an acute myocardial infarction  secondary to abrupt vein graft closure last month.  Primary PCI was  performed, and a stent was placed in the saphenous vein graft to the  obtuse marginal branch of the left circumflex.  At the time of initial  angiography, he was also found to have severe vein graft disease  involving the saphenous vein graft to the distal right coronary artery.  In a staged manner, PCI was performed, and a large bare metal stent was  placed in the saphenous vein graft to the right coronary artery.  Mr.  Clauson had initially been doing well, but he began having chest discomfort  with exertion during cardiac rehab.  He had progressive symptoms and was  hospitalized.  He was referred again for cardiac catheterization.   Risks and indications of the procedure were reviewed with the patient.  Informed consent was obtained.  The right groin was prepped, draped,  anesthetized with 1% lidocaine. Using modified Seldinger technique, a 6-  French sheath was placed in the right femoral artery.  Coronary  angiography was performed with standard 6-French Judkins catheters.  Saphenous vein graft angiography was performed with a JR-4 catheter.  I  attempted to image the LIMA  with a JR-4 catheter but had to change out  to the LIMA catheter.  I had a good bit of difficulty engaging the LIMA  but ultimately was able to selectively image the LIMA with a 6-French  LIMA catheter.   At the conclusion of the diagnostic procedure, I elected to intervene on  the right PDA.  The vein grafts were widely patent as were the stents in  the grafts.  There is a large right PDA that has 90% stenosis beyond the  saphenous vein graft anastomosis.  Angiomax was used for  anticoagulation.  The patient is on aspirin and clopidogrel.  Once a  therapeutic ACT was achieved, a 6-French RCV guide catheter was  inserted.  A Cougar guidewire was passed beyond the area of high-grade  stenosis into the distal PDA.  I initially attempted to predilate the  lesion with a 2.5 x 15 mm Sprinter balloon but was unable to pass the  balloon beyond the lesion.  I then changed out to a 1.5 x 20 mm balloon  and was able to pass the balloon and dilate the lesion up to 12  atmospheres.  Following predilatation with the 1.5 balloon, I was able  to pass a 2.5 balloon across the lesion and dilated up to 10  atmospheres.  Following predilatation, there was significant residual  stenosis, but the lesion did open up fairly well.  I was able to pass a  3.0 x 18 mm Endeavor stent which was deployed at 16 atmospheres.  The  stent was well expanded, and there was TIMI-3 flow following stenting.  I then postdilated the stent with 3.25 x 15 mm Quantum Maverick balloon  up to 16 and then 18 atmospheres on two inflations.  Post dilatation,  there was an excellent angiographic result with TIMI-3 flow throughout  and good stent expansion.  The patient tolerated the procedure well.  There were no immediate complications.   FINDINGS:  The left coronary artery is severely diseased.  The left  mainstem has severe distal stenosis of 80% with heavy calcification.  It  bifurcates into the LAD and left circumflex.   The LAD  is severely diseased with 80% ostial stenosis and total  occlusion at the site of the first septal perforator.   The left circumflex is severely diseased with total occlusion in the  proximal circumflex at the site of the first OM branch. The first OM  branch appears small and is totally occluded   The right coronary artery has ostial occlusion.   Saphenous vein graft to the distal right coronary artery is widely  patent.  There is a stent in the midbody the graft that is widely  patent. At the distal end of the stent, there is minor irregularity but  no flow-limiting disease.  The PDA and posterior AV segment both fill  from the graft.  The posterior AV segment has diffuse nonobstructive  disease. The PDA has a severe eccentric 90% stenosis in its proximal  aspect.  It is a relatively large vessel that appears 2.5 to 3 mm in  diameter.  It branches into twin vessels distally and supplies much of  the inferior wall.  There are collaterals to the left circumflex system  supplied by the distal right coronary artery as well.   Saphenous vein graft to the first OM branch of the left circumflex is  patent.  There is 40-50% stenosis in the proximal body the graft that is  unchanged from previous studies.  There is a patent stent with no  evidence of restenosis. The OM branch beyond the graft insertion is  diffusely diseased.  The circumflex fills retrograde from graft flow and  supplies a lower OM branch.  There are also further collaterals to  another left circumflex branch that fills via the same graft.   The LIMA to LAD is widely patent. After the anastomotic site, the LAD  and diagonal branches are very small vessels with severe diffuse  disease.   ASSESSMENT:  1. Severe native three-vessel coronary artery disease.  2. Status post coronary bypass surgery with three patent grafts  3. Successful percutaneous coronary intervention of the right      posterior descending artery with an  endeavor drug-eluting stent.   Recommend indefinite aspirin and clopidogrel.  The patient will be  eligible for discharge tomorrow.      Veverly Fells. Excell Seltzer, MD  Electronically Signed     MDC/MEDQ  D:  07/07/2007  T:  07/07/2007  Job:  [id number]   cc:   Quita Skye. Artis Flock, M.D.  [name]. Riley Kill, MD, [facility]

## 2011-02-05 NOTE — Assessment & Plan Note (Signed)
[facility]                            CARDIOLOGY OFFICE NOTE   HARJAS, BIGGINS                        MRN:          [id number]  DATE:11/25/2007                            DOB:          [birth year]    Shawn [name] is back in for follow-up.  He continues to have some back  discomfort.  It remains localized, and tender to the touch.  He denies  any active ongoing chest pain.   MEDICATIONS:  1. Glipizide ER 10 mg daily.  2. Crestor 10 mg daily.  3. Plavix 75 mg daily.  4. Isosorbide mononitrate 30 mg daily.  5. Potassium chloride 10 mEq daily.  6. Furosemide 20 mg daily.  7. Metoprolol succinate ER 50 mg daily.  8. Aspirin 325 mg daily.  9. Vitamin one daily.  10.Norvasc 7.5 mg daily.  11.Finasteride 5 mg daily.   PHYSICAL EXAMINATION:  The blood physical is 140/64, the pulse is 64.  The lung fields are clear.  His cardiac exam is unchanged.  He clearly  has a localized area of tenderness just inferior to the scapula this  reproduces much of his localized pain.   At the present time, we will recommend continued medical therapy.  I  will see him back in follow-up within 3 months.  Should he have any  problems in the interim, we will be happy to see him at any time.     Shawn [name]. Shawn Kill, MD, [facility]  Electronically Signed    TDS/MedQ  DD: 01/16/2008  DT: 01/16/2008  Job #: [id number]

## 2011-02-05 NOTE — Cardiovascular Report (Signed)
NAMEAMYR, SLUDER NO.:  [id number]   MEDICAL RECORD NO.:  [id number]          PATIENT TYPE:  INP   LOCATION:  2807                         FACILITY:  [facility]   PHYSICIAN:  [name]. Riley Kill, MD, FACCDATE OF BIRTH:  04-30-1928   DATE OF PROCEDURE:  06/10/2007  DATE OF DISCHARGE:                            CARDIAC CATHETERIZATION   Shawn [name] is a very delightful 77 year old well-known to [place].  He has  underlying diabetes, borderline renal insufficiency, hyperlipidemia and  prior bypass.  He subsequently underwent bypass surgery and has done  well.  He had some known mild vein graft disease documented in 2005 and  has been treated medically.  Recently, he had a back injection done and  came off of aspirin and Plavix for the procedure.  On the fifth day  prior to the procedure, the patient developed acute chest pain and EKG  changes compatible with a myocardial infarction.  The vein graft to the  circumflex system was occluded and was subsequently stented using a  protection device.  He has stabilized and an echo suggested an EF of  45%.  He also had a high-grade saphenous vein graft stenosis in the  distal right graft.  He, unfortunately, has distal disease as well with  a fair amount of PDA as well as posterolateral stenosis.  He was brought  back today for possible percutaneous intervention of the saphenous vein  graft to the right.  Importantly, we had him seen by Dr. Ofilia Neas  prior to this.  The patient has a modest renal insufficiency, age 57 and  diabetes and was felt to be a moderately high-risk candidate for redo  revascularization.  As a result, we elected to proceed on with an  attempted vein graft stabilization.  The risks were reviewed in detail.  He also had had a high potassium and was seen by Cecille Aver,  M.D., for the renal service.   PROCEDURES:  1. Saphenous vein graft angiography.  2. Percutaneous stenting of the saphenous vein  graft to the right      coronary.   DESCRIPTION OF PROCEDURE:  The patient was brought to the  catheterization laboratory and prepped and draped in the usual fashion.  We used an anterior puncture to enter the left femoral artery.  A 6-  French sheath was placed.  A right coronary 6-French JR-4 guide with  side holes was utilized.  We injected the previous graft and this  demonstrated continued patency.  The graft to the right coronary artery  was then injected, and I discussed again the case with Dr. Tyrone Sage.  Following this, we elected to go ahead and treat the vein graft.  We  debated about the distal vessel in part because of the patient's  elevated creatinine and the fact that it would likely take a significant  increase in the overall amount of dye.  Preparations were then made for  percutaneous intervention.  In order to get an adequate-size stent, we  had to upgrade to a 7-French sheath and a 7-French guide.  Bivalirudin  was  given according to protocol and ACT was checked and found to be  appropriate for PCI.  The lesion was then crossed with a BMW wire and  then a 6-French Spider was placed distally.  This was excellent.  A 3 x  12-mm Maverick balloon was then utilized to predilate the lesion and  finally the vessel was stented using a 5 mm x 18-mm length Guidant Ultra  stent.  This was taken to approximately 11 atmospheres and then  subsequently deflated.  The balloon was then removed.  Follow-up views  were obtained.  We then retracted the previously-placed a Spider  catheter.  He had very mild ST changes and with mild chest pain,  intracoronary verapamil was administered, which resulted in resolution.  He was given fentanyl for back pain.  The symptoms ultimately resolved.  At this point we had used well over 100 mL for the procedure and I  elected not to proceed on to the distal lesion.   ANGIOGRAPHIC DATA:  1. The previously-placed stent in the midportion of saphenous  vein      graft to the OM system remains widely patent.  There is about 30-      40% narrowing at the ostium, and this is unchanged from the acute      study.  2. The saphenous vein graft to the distal right circulation has an      ulcerative lesion in the mid vessel, and it is a very large graft.      Distal to this there is some irregularity and this leads into the      distal right, which consists of a large PDA.  The PDA bifurcates      distally and has a 70-80% segmental plaque in its proximal portion.      There is diffuse 50 and 70% narrowing of the continuation branch of      the right coronary as well.  This collateralizes the distal      circumflex system.  Following stenting, this stenosis is reduced to      0% and there is TIMI-3 flow at the end of the procedure without any      obvious evidence of distal embolization.  Just distal to the stent      is an area of about 30% narrowing but the stent appeared to be well-      deployed, as did the filter device, during the procedure.   CONCLUSION:  1. Successful percutaneous stenting of the saphenous vein graft to the      right coronary vessel with diffuse distal disease as noted.  2. Continued patency of the saphenous vein graft to the obtuse      marginal.   DISPOSITION:  The patient has an ejection fraction of 45% by echo.  We  will continue to monitor him carefully.  Close follow-up in the office  will be mandated.      [name]. Riley Kill, MD, [facility]  Electronically Signed     TDS/MEDQ  D:  06/10/2007  T:  06/11/2007  Job:  [id number]   cc:   Quita Skye. Artis Flock, M.D.  [facility]  Sheliah Plane, MD

## 2011-02-05 NOTE — Op Note (Signed)
Shawn [name], Shawn [name]                 ACCOUNT NO.:  [id number]   MEDICAL RECORD NO.:  [id number]          PATIENT TYPE:  AMB   LOCATION:  DSC                          FACILITY:  [facility]   PHYSICIAN:  Katy Fitch. Sypher, M.D. DATE OF BIRTH:  1928/04/03   DATE OF PROCEDURE:  12/01/2008  DATE OF DISCHARGE:                               OPERATIVE REPORT   PREOPERATIVE DIAGNOSIS:  Chronic stenosing tenosynovitis, right ring  finger at A1 pulley.   POSTOPERATIVE DIAGNOSIS:  Chronic stenosing tenosynovitis, right ring  finger at A1 pulley.   OPERATIONS:  Release of right ring finger A1 pulley.   OPERATING SURGEON:  Katy Fitch. Sypher, MD   ASSISTANT:  Marveen Reeks Dasnoit, PA   ANESTHESIA:  Lidocaine 2% flexor sheath block and metacarpal head level  block, right ring finger supplemented by IV sedation.   SUPERVISING ANESTHESIOLOGIST:  Zenon Mayo, MD   INDICATIONS:  Shawn [name] is a 77 year old gentleman referred through  the courtesy of Dr. Bradd Canary.  He has had shoulder impingement,  rotator cuff, degenerative changes, and triggering of his right ring  finger unresponsive to injection and activity modification.   Due to a failure to respond to nonoperative measures, he is brought to  the operating at this time for release of his right ring finger A1  pulley.   PROCEDURE:  Shawn [name] was brought to the operating room and placed  in supine position on the operating table.   Following sedation, the right arm was prepped with Betadine and soap and  solution and sterilely draped.  A pneumatic tourniquet was applied to  proximal right brachium.  Following exsanguination of the right arm with  an Esmarch bandage, the arterial tourniquet was inflated to 200 and 50  mmHg.  The procedure commenced with a short transverse incision directly  over the palpably thickened A1 pulley.  Subcutaneous tissues were  carefully divided taking care to identify the palmar fascia.  The  pretendinous fibers were released.  The A1 pulley was isolated.  There  was bulging tenosynovium noted.  After clearing of inflammatory tissues,  the A1 pulley was released with scalpel and scissors.  The tendons were  delivered and found be otherwise normal.   Full range of motion of fingers recovered.  The wound was then repaired  with interrupted suture of 5-0 nylon.  A compressive dressing was  applied with Xeroflo sterile gauze and Ace wrap.  There were no apparent  complications.   Shawn [name] tolerated the surgery and anesthesia well.  He was transferred  to recovery room with stable signs.      Katy Fitch Sypher, M.D.  Electronically Signed     RVS/MEDQ  D:  12/01/2008  T:  12/01/2008  Job:  [id number]

## 2011-02-05 NOTE — Assessment & Plan Note (Signed)
[facility]                         GASTROENTEROLOGY OFFICE NOTE   NAME:Shawn [name], Shawn [name]                        MRN:          [id number]  DATE:04/01/2007                            DOB:          Sep 01, 1928    Shawn [name] returns in followup today feeling well, except for continued  problems with constipation and minimal hematochezia.  He has notes an  occasional small amount of bright red blood on the tissue paper with  bowel movements, particularly following straining.  He did have internal  hemorrhoids noted at colonoscopy as well as adenomatous polyps and  several areas of patchy colitis in the transverse and descending colon  that had a nonspecific appearance.  The pathologist report suggested an  aphthoid-type focus of inflammation.  They were all acute changes with  no chronic inflammatory component.   CURRENT MEDICATIONS:  Listed on the chart, updated, and reviewed.   MEDICATION ALLERGIES:  None known.   EXAM:  In no acute distress.  Weight 235.2 pounds, blood pressure 134/60, pulse 68 and regular.  CHEST:  Clear to auscultation bilaterally.  CARDIAC:  Regular rate and rhythm without murmurs.  ABDOMEN:  Soft and nontender with normoactive bowel sounds.   ASSESSMENT AND PLAN:  1. Chronic constipation.  He may use milk of magnesia daily or every      other day along with a high fiber diet and increased fluid intake      for long term management.  2. Nonspecific mild patchy colitis.  Etiology unclear.  Expectant      management.  If he has persistent gastrointestinal complaints will      proceed with further evaluation with a small bowel capsule      endoscopy and serologies for inflammatory bowel disease.  We will      also consider repeating the colonoscopy.  3. Small volume hematochezia likely secondary to internal hemorrhoids.      May use an over-the-counter suppository on a p.r.n. basis.     Venita Lick. Russella Dar, MD, [facility]  Electronically  Signed    MTS/MedQ  DD: 04/03/2007  DT: 04/03/2007  Job #: [id number]   cc:   Quita Skye. Artis Flock, M.D.

## 2011-02-05 NOTE — H&P (Signed)
Shawn [name], Shawn [name]                 ACCOUNT NO.:  [id number]   MEDICAL RECORD NO.:  [id number]          PATIENT TYPE:  INP   LOCATION:  2905                         FACILITY:  [facility]   PHYSICIAN:  Vernice Jefferson, MD          DATE OF BIRTH:  [birth year]   DATE OF ADMISSION:  06/02/2007  DATE OF DISCHARGE:                              HISTORY & PHYSICAL   CHIEF COMPLAINT:  Chest pain.   HISTORY OF PRESENT ILLNESS:  The patient is a 77 year old white male  with history of CABG in 1990 with three vessels;  LIMA to LAD, saphenous  vein graft OM and a saphenous vein graft to his right, diabetes,  hypertension, hyperlipidemia, and chronic back pain who comes in with  complaint of chest pain x24 hours.  He states that his chest pain  worsened last night and resolved, and then earlier this evening the  patient had a much worsening of his chest pain associated with some  shortness of breath, diaphoresis and left arm numbness and tingling.  On  my inquiry to the ED, the patient had I and II and aVF arrest and  resuscitated with two consecutive shocks; first with 120 joules, the  second one with 200 joules.  Epinephrine and CPR and then ACLS protocol  given.  The patient was emergently taken to the cath lab.  The patient  states that he did stop his aspirin and Plavix last Friday,  approximately five days ago in order to have a procedure to improve his  back pain.   PAST MEDICAL HISTORY:  1. Coronary artery disease, status post three-vessel CABG in 1990.      Subsequent catheterization demonstrating patency of his LIMA to the      LAD graft, saphenous vein graft to OM with 50% proximal and mid      lesion and right coronary artery graft 50% mid lesion, native three-      vessel disease.  2. Hypertension.  3. Diabetes.  4. Hyperlipidemia.   SOCIAL HISTORY:  He is a nonsmoker, nondrinker, not an IV drug user.   FAMILY HISTORY:  Reviewed and noncontributory to the patient's current  medical  condition.   ALLERGIES:  No known drug allergies.   MEDICATIONS:  1. Glipizide 10 mg p.o. daily.  2. Nifedipine 60 mg b.i.d.  3. Atenolol 50 mg daily.  4. Crestor 10 mg daily.  5. Plavix 75 mg daily.  6. Nabumetone 500 mg daily.  7. Diovan/HCTZ 80/12.5 mg.  8. Isosorbide mononitrate 30 mg daily.  9. Aspirin 81 mg daily.   PHYSICAL EXAMINATION:  VITAL SIGNS:  Blood pressure in the ED was  initially 154/75.  Heart rate 72, respirations 12, temperature 97.5.  GENERAL:  Well-developed, white male in mild pain.  HEENT:  Moist mucous membranes.  No scleral icterus, no conjunctival  pallor.  NECK:  Supple.  Full range of motion.  No jugular venous distension.  CARDIOVASCULAR:  Tachycardic. Regular rate and rhythm.  No rubs, murmurs  or gallops.  CHEST:  Clear to auscultation bilaterally.  No wheezes,  rales or  rhonchi.  ABDOMEN:  Soft, nontender, nondistended.  Normoactive bowel sounds.  EXTREMITIES:  No peripheral edema.  Pulses are 2+ bilaterally.  NEUROLOGIC:  Nonfocal.   Initial ECG demonstrated a normal sinus rhythm with mild anteroseptal ST  depression  that worsened while in the ED to approximately 1.5 mm ST  depression  in V2 and V3.  No reciprocal changes noted.  Chest x-ray  pending.   IMPRESSION:  1. ST elevation myocardial infarction.  2. Diabetes.  3. Hypertension.  4. Hyperlipidemia.   PLAN:  The patient was taken to the cath lab emergently and found to  have an occluded saphenous vein graft to his OM, successfully stented  with TIMI-3 flow post procedure with a Driver 2.5 x 28 mm stent.  The  patient is going to be restarted on aspirin and Plavix.  He has already  received heparin while in the ED and bivalirudin on the table.  Will  watch for further ectopy.  At least right now we will hold the  amiodarone given his relative hypotension on the table and hold his  antihypertensives right now and continue with his statin.  Will  potentially revisit fixing this  right coronary artery graft which did  demonstrate an 85-95% mid stenosis.  Dr. Riley Kill will make the decision  with the CT surgeon later.  Repeat a 2-D echocardiogram in the morning  to further evaluate any drop in EF and follow labs to watch for contrast  nephropathy.      Vernice Jefferson, MD  Electronically Signed     JT/MEDQ  D:  06/03/2007  T:  06/03/2007  Job:  [id number]

## 2011-02-05 NOTE — H&P (Signed)
[name], [name]                 ACCOUNT NO.:  [id number]   MEDICAL RECORD NO.:  [id number]          PATIENT TYPE:  INP   LOCATION:  6529                         FACILITY:  [facility]   PHYSICIAN:  Shawn Frieze. Jens Som, MD, FACCDATE OF BIRTH:  07-Oct-1927   DATE OF ADMISSION:  07/06/2007  DATE OF DISCHARGE:                              HISTORY & PHYSICAL   PRIMARY CARE PHYSICIAN:  Shawn Morton. Riley Kill, MD, [facility]   PRIMARY CARDIOLOGIST:  Shawn [name], M.D., [facility].   SUMMARY OF HISTORY:  Shawn [name] is a 77 year old white male who was  referred to [facility] emergency room secondary to chest discomfort that  occurred a cardiac rehab.   During his first day of cardiac rehab while walking the circumference 6  minutes into the walk, he developed an anterior chest aching sensation.  He states that he was not necessarily short of breath, but he did notice  something was different about his breathing.  He denied any radiation,  nausea, vomiting, diaphoresis.  He told the staff at approximately the  8th minute, and he sat down to rest and took a sublingual nitroglycerin.  The discomfort went from a 6 to a 1 on a scale of 0/10.  He states all  in all it lasted less than 10 minutes.  He also feels it is very  different from his June 01, 2007 MI/V fib arrest.  At that time, he  experienced severe indigestion for the preceding 2 weeks associated with  shortness of breath and nausea.  Since his hospitalization, he has not  really exerted himself; however, he has noticed since his  hospitalization that he no longer has any dyspnea on exertion or PND.  In regards to this occurrence, he thinks he may have had it before, and  he feels that he does have his type of discomfort when he overexerts  himself, but he cannot really fully explain.  It is not pleuritic in  nature, and he denies any recent accidents or injuries.   ALLERGIES:  NO KNOWN DRUG ALLERGIES.   MEDICATIONS PRIOR TO ADMISSION:  1.  Glipizide 10 mg p.o. daily.  2. Aspirin 325 mg p.o. daily.  3. Toprol-XL 50 mg daily.  4. Lasix 20 mg daily.  5. Plavix 75 mg daily.  6. Crestor 10 mg daily.  7. Imdur 30 mg daily.  8. Multivitamin daily.  9. K-Dur 10 mEq daily.  10.Milk of Magnesia p.r.n.   PAST MEDICAL HISTORY:  1. Type 2 diabetes diagnosed approximately 15 years ago.  Sugars at      home run in the 90s, and he is not sure what the upper limit is.      2.  He also has a 15-year history of hypertension.  2. History of hyperlipidemia.  3. Chronic kidney disease, stage III.  Baseline creatinine is unclear;      however, at the time of office followup on the 25th, his creatinine      was 1.6.  4. Chronic back pain with injections.  5. Prostate cancer with surgery.  6. Small-bowel obstruction in 2004.  7. History of coronary artery disease with a three-vessel bypass graft      in 1990 with a LIMA to the LAD, saphenous vein graft to the OM, and      saphenous vein graft to the RCA.  On June 02, 2007, he was      hospitalized with a ST elevated myocardial infarction associated      with a V fib arrest requiring 2 shocks and CPR.  It was noted that      he had discontinued his aspirin and Plavix approximately 5 days      prior to the event secondary to pending back injection.  Emergent      cath was performed at that time and showed native three-vessel      coronary artery disease, patent LIMA to the LAD with distal small      vessel disease, 50%/total saphenous vein graft to the circumflex.      At that time, he received a bare metal stent.  He also was noted to      have a 95% saphenous vein graft to the RCA and a 90% PLA.  He had a      staged metal stent to the saphenous vein graft to the RCA.  Last      echocardiogram on June 09, 2007 showed an EF of approximately      45%.  It was noted to be technically limited.  Inferior posterior      hypokinesis. The RV was not well- visualized.   SOCIAL HISTORY:   The patient resides in [place] alone.  He is  widowed.  He has 4 children, 9 grandchildren.  No great-grandchildren.  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  He denies any alcohol, drugs, or herbal medication.  He tries  to maintain a low-fat diet.  He states that he does exercise somewhat  with walking, and he uses a stationary bike for a few minutes every  other day.   FAMILY HISTORY:  His mother died at 27 from old age and his father at  age of 46 with some type of fever in 16.  He has 2 brothers and 3  sisters.  All are deceased.   REVIEW OF SYSTEMS:  In addition to the above is notable for a 20-pound  weight loss since the beginning of his hospitalization.  Glasses,  bilateral slight hearing loss, dentures, nocturia, slight depression, as  he is living alone; back and hip arthralgias, and constipation.  All  other points are negative.   PHYSICAL EXAMINATION:  GENERAL:  Well-nourished, well-developed,  pleasant, slightly obese white male in no apparent distress.  VITAL SIGNS:  Temperature is 97, blood pressure is 144/74, pulse 97 and  regular, respirations 20, 96% saturation on room air.  HEENT:  Grossly  unremarkable.  NECK:  Supple without thyromegaly, adenopathy, JVD, or carotid bruits.  CHEST:  Symmetrical excursion.  Clear to auscultation, without rales,  rhonchi, or wheezing.  HEART:  PMI is not displaced.  Regular rate and rhythm.  Do not  appreciate any murmurs, rubs, clicks, or gallops.  ABDOMEN:  Obese.  Bowel sounds present, without organomegaly, masses, or  tenderness.  EXTREMITIES:  Negative cyanosis, clubbing, or edema.  Peripheral pulses  are symmetrical and intact.  Do not appreciate any abdominal or femoral  bruits.  MUSCULOSKELETAL:  Unremarkable.  NEUROLOGIC:  Unremarkable.   LABORATORY DATA:  Labs from the  emergency room are pending at the time  of this dictation.   EKG in the emergency room shows baseline  artifact, normal sinus rhythm,  normal axis, early R wave.  He has diffuse T wave inversion  inferolaterally that looks more pronounced compared to an EKG on  June 11, 2007.  However, compared to an office EKG of June 18, 2007, the T wave inversion is less pronounced.   IMPRESSION:  1. Symptoms, even though different from his myocardial infarction on      June 02, 2007, are somewhat concerning for acute chest      syndrome.  There are no acute EKG changes.  2. Hypertension.  3. History as noted per past medical history.   DISPOSITION:  Dr. Jens [name] reviewed the patient's history, spoke with  and examined the patient and agrees with the above.  We will admit the  patient, place him on his home medications, except for his Lasix, in  anticipation of cardiac catheterization to reevaluate his recent two-  vessel stenting.  We will also start him on IV heparin per pharmacy.  We  will review labs, as they are pending at the time of this dictation.  Future plan will be based on course.      Joellyn Rued, PA-C      Shawn Frieze Jens Som, MD, [facility]  Electronically Signed    EW/MEDQ  D:  07/06/2007  T:  07/07/2007  Job:  [id number]   cc:   Quita Skye. Artis Flock, M.D.

## 2011-02-05 NOTE — Discharge Summary (Signed)
NAMEJAHRELL, Shawn [name]                 ACCOUNT NO.:  [id number]   MEDICAL RECORD NO.:  [id number]          PATIENT TYPE:  INP   LOCATION:  6529                         FACILITY:  [facility]   PHYSICIAN:  Veverly Fells. Excell Seltzer, MD  DATE OF BIRTH:  May 03, 1928   DATE OF ADMISSION:  07/06/2007  DATE OF DISCHARGE:  07/08/2007                               DISCHARGE SUMMARY   PRIMARY CARDIOLOGIST:  Maisie Fus D. Riley Kill, MD.   PRIMARY CARE PHYSICIAN:  Quita Skye. Artis Flock, M.D.   PROCEDURES PERFORMED DURING HOSPITALIZATION:  1. Cardiac catheterization dated July 07, 2007 by Dr. Tonny Bollman.  A:  Left coronary artery severely diseased.  The left main      sent with severe distal stenosis of 80% with heavy calcification,      it bifurcates into the LAD and left circumflex.  The LAD is      severely diseased with 80% ostial stenosis and a total occlusion at      the site of the first septal perforator.  The left circumflex is      severely diseased with a total occlusion in the proximal circumflex      at the site of the first OM branch.  The first OM branch appears      small and totally occluded.  The right coronary artery has ostial      occlusion.  B:  Saphenous vein graft to the distal coronary artery      is widely patent.  The stent in the mid-body of the graft is widely      patent.  At the distal end of the stent, there is minor      irregularity, but no flow-limiting disease.  The PDA and the      posterior AV segment both fill from the graft.  The posterior AV      segment has diffuse non-obstructing disease.  The PDA has severe      eccentric 90% stenosis in the proximal aspect.  It is a relatively      large vessel that appears 2.5 to 3 mm in diameter.  It branches      into twin vessels distally and not as much of the inferior wall.      There are collaterals to the left circumflex distance supplied by      the distal right coronary artery as well.  The saphenous vein graft      is the  first OM branch and the left circumflex is patent.  There is      40 to 50% stenosis in the proximal body of the graft that is      unchanged from previous studies.  There is a patent stent with no      evidence of restenosis.  The OM branch beyond the graft insertion      is diffusely diseased.  The circumflex fills retrograde from the      graft low and supplies the lower OM branch.  There is also further      collator of another  left circumflex branch that fills the same      graft.  The LIMA to the LAD is widely patent after the anastomotic      site, the LAD and diagonal branches are very small vessels with      severe diffuse disease.  C:  Severe native coronary artery disease      status post coronary artery bypass graft with three patent grafts,      successful percutaneous coronary intervention of the right      posterior descending artery with an Endeavor drug-eluting stent.   FINAL DISCHARGE DIAGNOSES:  1. Known coronary artery disease.  A:  Status post coronary artery      bypass grafting in the past.  B:  Successful percutaneous coronary      intervention of the right posterior descending artery with an      Endeavor drug-eluting stent.  2. Type 2 diabetes.  3. History of hyperlipidemia.  4. Chronic kidney disease stage 3 based on creatinine is 1.6.  5. Chronic back pain with injections.  6. Prostate cancer with surgery.  7. Small bowel obstruction in 2004.   HOSPITAL COURSE:  This is a 77 year old Caucasian male who was referred  to [facility] Emergency Room secondary to chest discomfort that occurred  at cardiac rehab.  The patient had recent admission for coronary artery  bypass grafting and PTCA and stenting.  The patient was at cardiac rehab  and while walking the circumference, 6 minutes into the walk, he  developed an anterior chest achy sensation.  He states that he was not  short of breath but he did notice something different about his  breathing.  He denied  any radiation, nausea, vomiting or diaphoresis.  He told the staff approximately 2 minutes later and sat down and took a  sublingual nitroglycerin.  The discomfort went from a 6 to a 1 on the  scale of 0 to 10 and lasted approximately 10 minutes.  The patient  thinks that it was different from his September MI and Vfib arrest.  The  patient was brought to the emergency room as a result of this assessment  by the cardiac rehab RN and admitted to rule out myocardial infarction.  The EKG in the emergency room showed baseline artifact, normal sinus  rhythm with normal access and early R-wave.  He had diffuse T-wave  inversion inferolaterally that looks more pronounced compared to the EKG  on September 18.  However, compared to the EKG of June 18, 2007,  the T-wave inversion was less pronounced.  The patient was seen and  examined by Dr. Jens Som who reviewed the patient's history and spoke  with the patient and he agreed to be admitted.  The patient was started  on his home medications and cardiac catheterization was planned for  reevaluation of his recent 2-vessel stenting.  The patient was also  started on a heparin drip and monitored closely overnight.   On July 07, 2007, the patient did have a cardiac catheterization by  Dr. Tonny Bollman.  The patient had a PCI to the right PDA with an  Endeavor drug-eluting stent and was advised to be on aspirin and Plavix  as an outpatient.   It was noted that the patient's troponins were mildly elevated post-  stenting with a troponin of 0.20.  The patient also had some complaints  of lower back pain with a history of disk problems and was given pain  control.  The following morning, on July 08, 2007, the patient was seen and  examined by myself and Dr. Tonny Bollman and was found to be stable.  He had no complaints of further chest pain.  He was sleepy as he had not  received a lot of rest during the night but denied any acute  symptoms.  The patient was up and ambulating throughout the day and had no  recurrence of chest discomfort.  The patient was found to be stable  enough for discharge and was to follow up with Dr. Riley Kill as an  outpatient.  On discharge, the patient was started on Plavix and  aspirin.   DISCHARGE LABS:  Sodium 138, potassium 4.0, chloride 108, CO2 28, BUN  13, creatinine 1.2, glucose 108, hemoglobin 12.8, hematocrit 37.9, white  blood cells 9.1, platelets 207.  Troponin 0.05, 0.06, 0.04, and 0.20  respectively.   VITAL SIGNS:  134/55, heart rate 60, respirations 18, temperature 97.7,  O2 sat 92% on room air.   DISCHARGE MEDICATIONS:  1. Glipizide-ER 10 mg daily.  2. Potassium chloride 10 mEq daily.  3. Crestor 10 mEq daily.  4. Plavix 75 mg daily.  5. Furosemide 20 mg daily.  6. Metoprolol 50 mg daily.  7. Isosorbide mononitrate-ER 30 mg daily.  8. Aspirin 325 mg daily.  9. Multivitamin once a day.   ALLERGIES:  No known drug allergies.   FOLLOWUP PLANS AND APPOINTMENTS:  1. The patient is to be followed by Dr. Shawnie Pons on October 20      at 11:30 a.m.  2. Echocardiogram will be completed prior to this admission with      discussion with Dr. Riley Kill on that followup appointment.  3. The patient is to continue with his primary care physician for      medical management.  4. The patient was given post-cardiac catheterization instructions      with particular references on the right groin site for evidence of      hematoma, bleeding, signs of infection or severe pain.   Time spent with the patient to include physician time:  35 minutes.      Bettey Mare. Lyman Bishop, NP      Veverly Fells. Excell Seltzer, MD  Electronically Signed    KML/MEDQ  D:  08/05/2007  T:  08/05/2007  Job:  [id number]

## 2011-02-05 NOTE — Letter (Signed)
August 26, 2007    Quita Skye. Artis Flock, M.D.  [address]  [place], [place] [address]   RE:  EYOB, GODLEWSKI  MRN:  [id number]  /  DOB:  Jan 15, 1928   Dear Rosanne Ashing:   I had the pleasure of seeing Mr. Waymire in the office today on a follow up  visit. In general, he has been stable. He did develop what appears to be  a bruise on his left foot and he has had some discomfort associated with  this. He does not remember any specific trauma, but there is some  ecchymosis noted on the dorsal aspect of the left foot. The discomfort  that he had previously has generally resolved.   PHYSICAL EXAMINATION:  VITAL SIGNS:  Blood pressure 166/79, pulse 68.  LUNGS:  Fields are clear.  CARDIAC:  Rhythm is regular.  FOOT:  Small area of ecchymosis.   Recently, his blood pressure has been modestly elevated, especially  during rehab. I have made the decision to go ahead and start him on some  Norvasc 5 mg daily to add to his regimen. He was previously on an  angiotensin receptor blocker, which would be appropriate for diabetes,  but he had some hyperkalemia in the hospital and we were not quite sure  as to the etiology of this. Nonetheless, we are continuing to monitor  his situation. We will get a basic metabolic profile today and I will  see him back in follow up in about four weeks.   I appreciate the opportunity of sharing in his care.   ADDENDUM   The cardiac rehab program at [facility] recently obtained a cholesterol  on the patient. It was 116. His LDL direct was 66 with an HDL of 29, and  a hemoglobin a1C of 6.8.    Sincerely,      [name]. Riley Kill, MD, [facility]  Electronically Signed    TDS/MedQ  DD: 08/26/2007  DT: 08/27/2007  Job #: [id number]

## 2011-02-05 NOTE — Letter (Signed)
July 13, 2007    Luanna Salk, M.D.  [address]  [place], [place] [address]   RE:  SHAUNAK, KREIS  MRN:  [id number]  /  DOB:  [birth year]   Dear Rosanne Ashing,   Had the pleasure of seeing Tandy Lewin in the office today in followup.  Recently, in my absence he developed recurrent chest pain at cardiac  rehab.  He underwent repeat cardiac catheterization by my colleague, Dr.  Calton Dach.  We had previously stented 2 saphenous vein grafts and he  did have some distal disease in his posterior descending artery which  was moderately severe, but we had limited percutaneous intervention  largely because of contrast load limitations and he had been doing well  from a clinical standpoint.  Kathlene November went ahead and placed a drug-eluting  stent in his posterior descending vessel, and the patient had an  uneventful hospital stay.  He is doing clinically well and feels  somewhat better.   EXAMINATION:  Today the patient is 154/74 with a pulse of 68, cardiac  rhythm is regular.  There are no rales.  The groin reveals some  ecchymosis but is healing.   Overall, Mr. Magnan appears to be getting along reasonably well.  He  denies any ongoing chest pain at the present time.  Fortunately his vein  graft stents were patent.  At the present time I plan to see him back in  followup in about 1 month's time.  He will continue in the rehab program  at [facility].  Should he have any problems in the interim he is to call  us promptly.  I appreciate the opportunity of sharing in his care.    Sincerely,      [name]. Riley Kill, MD, [facility]  Electronically Signed    TDS/MedQ  DD: 07/13/2007  DT: 07/14/2007  Job #: [id number]

## 2011-02-05 NOTE — Letter (Signed)
November 25, 2007    Quita Skye. Artis Flock, M.D.  [address], [address]  [place], [place] [address]   RE:  Shawn [name], Shawn [name]  MRN:  [id number]  /  DOB:  Oct 13, 1927   Dear Rosanne Ashing,   I had the pleasure seeing Shawn [name] in the office today in follow-up.  In fact, we have made him an appointment to see you tomorrow.  Cardiac-  wise he seems to be doing well.  He has had a fair amount of back pain,  and this has been occurring in the area just next to the spine.  In  fact, there has been some paraspinal muscle tenderness and some  discomfort.  He thinks that motion seems to make this somewhat worse.  It is not searing or tearing pain.  He questioned whether this could be  cardiac, but he really thinks it is more likely musculoskeletal.   His medications include:  1. Glipizide ER 10 mg daily.  2. Crestor 10 mg daily.  3. Plavix 75 mg daily.  4. Isosorbide ER 30 mg daily.  5. Potassium chloride 10 mEq daily.  6. Furosemide 20 mg daily.  7. Metoprolol 50 mg daily.  8. Aspirin 325 mg daily.  9. Multivitamin daily.  10.Norvasc 7.5 mg a day.   PHYSICAL:  He is alert and oriented.  The blood pressure is 134/80, and the pulse is 62.  The blood pressure  is equal in both arms.  The lung fields are clear to auscultation and percussion.  CARDIAC:  Unchanged.  ABDOMEN:  Soft.  Importantly, there is some tenderness in the paraspinal area on the  right.  It does not seem to be exquisitely tender, but clearly does seem  to reproduce at some point some of the pain.   I have asked him to see you tomorrow.  He has no acute EKG changes.  His  EKG reveals normal sinus rhythm and nonspecific T-wave abnormality with  some mild lateral T-wave changes, but I do not think that these are  acute.  Compared to the previous tracing, they are less pronounced than  in August 24, 2023.  I would recommend from a cardiac standpoint continued  medical therapy.  With regard to his back, I have recommended some local  measures,  but the other issue is whether or not he might need to have an  MRI of the thoracic spine.  I will have him follow up with you tomorrow  so that this can be readdressed and monitored closely.  If the quality  of his symptoms changes, please do not hesitate to let me know and let  us know if you think further studies need to be done.    Sincerely,      [name]. Riley Kill, MD, [facility]  Electronically Signed    TDS/MedQ  DD: 11/25/2007  DT: 11/25/2007  Job #: [id number]

## 2011-02-05 NOTE — Assessment & Plan Note (Signed)
[facility]                            CARDIOLOGY OFFICE NOTE   RENDER, Shawn [name]                        MRN:          [id number]  DATE:06/18/2007                            DOB:          Feb 15, 1928    [name] is in for followup.  He presented today in followup.  He  recently came off of aspirin and Plavix to have his back injected again.  He had tolerated this the first time.  The patient, 5 days after  stopping his medicines, and prior to having anything done, presented to  the emergency room with chest pain.  He had ventricular fibrillation  requiring cardioversion.  He was brought emergently to the cardiac  catheterization laboratory, where he had an occluded vein graft to the  circumflex, and this was stented using a protection device.  He  stabilized and an echo suggested an EF of 45% and a high-grade stenosis  in the saphenous vein graft to the right coronary vessel.  He has got a  fair amount of diffuse disease.  I had him evaluated by Dr. Sheliah Plane, and we carefully reviewed his entire situation with regard to  whether or not a redo bypass will be most appropriate.  However, we  elected to recommend a percutaneous intervention together.  This was  successfully undertaken later during the hospitalization.  The right  coronary graft was successfully dilated also with a non drug-eluting  stent.  He has generally done pretty well.  He has had a little bit of  shortness of breath during the hospitalization.  He had some  hyperkalemia.  He was short of breath lying flat, but this has improved.   MEDICATIONS:  1. Glipizide 10 mg daily.  2. Crestor 10 mg daily.  3. Plavix 75 mg daily.  4. Isosorbide 30 mg daily.  5. Potassium chloride 10 mEq daily.  6. Furosemide 20 mg daily.  7. Metoprolol succinate 50 mg daily.  8. Aspirin 325 mg daily.   He is no longer on his Diovan, in part because of the hyperkalemia that  he had in the  hospital, for which he was seen by the nephrology service.   PHYSICAL EXAMINATION:  He is alert and oriented gentleman in no acute  distress.  Blood pressure 134/60, pulse 67.  The lung fields are entirely clear and the jugular veins are not  distended.  The first and second heart sounds are normal.  Groins are well healed.  EXTREMITIES:  Do not reveal edema.   ELECTROCARDIOGRAM:  Demonstrates T wave inversion noted inferiorly and  laterally.  Fortunately, there is no evidence of significant R wave  loss.  His heart rate is 67 today.   IMPRESSION:  1. Status post coronary artery bypass grafting surgery with subsequent      acute vein graft closure to the OM graft with successful      reperfusion and subsequent percutaneous stenting of the distal      right graft.  The internal mammary remains intact with diabetic      distal left anterior descending  disease.  2. Hypercholesterolemia on lipid-lowering therapy.  3. Non-insulin-dependent diabetes mellitus.  4. Mild left ventricular dysfunction.  5. Chronic and recurrent back discomfort, currently reasonably stable.  6. Hypertension.   PLAN:  1. Return to clinic in 2 weeks, at which time a 2D echo will be      obtained.  2. A 2D echo in 2 weeks to assess left ventricular function.  3. Basic metabolic profile.  4. Recommend continued followup with Dr. Artis Flock.     [name]. Shawn Kill, MD, [facility]  Electronically Signed    TDS/MedQ  DD: 06/18/2007  DT: 06/18/2007  Job #: [id number]

## 2011-02-05 NOTE — Consult Note (Signed)
Shawn [name], Shawn [name]                 ACCOUNT NO.:  [id number]   MEDICAL RECORD NO.:  [id number]          PATIENT TYPE:  INP   LOCATION:  2905                         FACILITY:  [facility]   PHYSICIAN:  Sheliah Plane, MD    DATE OF BIRTH:  Mar 14, 1928   DATE OF CONSULTATION:  06/04/2007  DATE OF DISCHARGE:                                 CONSULTATION   FOLLOWUP CARDIOLOGIST:  Dr. Riley Kill.   PRIMARY CARE PHYSICIAN:  Dr. Vernice Jefferson.   REASON FOR CONSULTATION:  1. Acute myocardial infarction.  2. Coronary occlusive disease.   HISTORY OF PRESENT ILLNESS:  Patient is a 77 year old male who in 1990  had coronary artery bypass grafting with left internal mammary to the  LAD, reverse saphenous vein graft to the first OM and reverse saphenous  vein graft to his distal right.  Over the years, the patient has done  well without any acute events.  Repeat cardiac catheterization was  performed in 2005, where he had evidence of severe distal disease in the  LAD but with a patent mammary and mild vein graft disease.  Continued  medical therapy was undertaken.  Recently, the patient had had chronic  back and right leg and hip pain, had stopped his aspirin and Plavix for  an injection.  During this period began having recurrent chest pain  intermittently until the night of June 02, 2007, when the pain  worsened during the night with increasing shortness of breath,  diaphoresis, left arm numbness and tingling, came to the emergency room  and had a witnessed V-fib arrest, required shocks x2.  Patient was taken  immediately to the cath lab by Dr. Riley Kill and was found to have severe  native coronary disease and acute occlusion of the vein graft to a  relatively small circumflex system and vein graft stenosis and vein to  the right system of the mammary and diffuse disease in the LAD appeared  unchanged.  The patient had now been stabilized after successful  stenting and angioplasty of the vein graft to  the circ.  Now,  consideration of redo coronary artery bypass grafting for disease in the  right graft vs. Stenting is entertained.   PAST MEDICAL HISTORY:  1. Coronary artery bypass grafting as noted above.  2. History of hypertension.  3. History of diabetes, recent A1c is unknown.  4. History of hyperlipidemia.  5. History of prostate cancer with prostatectomy done in 1992.  6. Patient is a previous smoker, but quit at the time of his first      operation 18 years ago.  7. Also had been known to be a heavy drinker and quit 18 years ago.   SOCIAL HISTORY:  Patient is currently a nonsmoker and has not smoked for  18 years.   FAMILY HISTORY:  The patient denies any history of premature coronary  disease in his family.   CURRENT MEDICATIONS:  1. Glyburide 10 mg a day.  2. Nifedipine 60 b.i.d.  3. Atenolol 50 daily.  4. Crestor 10 a day.  5. Plavix 75 a day.  6.  Nabumetone 500 mg a day.  7. Diovan/HCTZ 80/12.5.  8. Isosorbide 30 mg a day.  9. Aspirin 81 mg a day.   CARDIAC REVIEW OF SYSTEMS:  The patient had acute symptoms as noted  above prior to this.  He denies chest pain until just several days  before the current episode.  He has mild pedal edema, left greater than  right.  Does have mild shortness of breath with exertion.  Denies any  difficulty with urination, denies any blood in his stool.  Denies fever,  chills or night sweats.  Denies orthopnea.  Denies polyuria or  polydipsia.  Other review of systems are negative.   PHYSICAL EXAM:  The patient is awake, alert, neurologically intact.  Blood pressure 140/20, heart rate 70, respiratory rate 18, he is  afebrile.  He is awake and alert, appears his stated age of 77 years.  He has no carotid bruits.  LUNGS:  Clear bilaterally.  He has a well healed sternal incision.  ABDOMINAL EXAM:  A moderately obese abdomen without palpable masses or  tenderness.  EXAMINATION OF THE LOWER EXTREMITIES:  Appears to have adequate  vein in  the left lower leg.  Vein was harvested from the right lower leg to just  above the knee.   LABORATORY FINDINGS:  Are reviewed, as is his cath film reviewed, with  Dr. Riley Kill.  His white count was 21,000, hematocrit of 45, TSH is 11.3,  creatinine is mildly elevated at 1.57 with an estimated GFR of 43.  His  total CK peaked at 2133 with an MB of 233 and a troponin of greater than  100.   The patient has no absolute contraindication to redo cardiac surgery,  though, with the extent of his distal disease, his overall age and  general health status, redo coronary artery bypass grafting would carry  significant risk and a prolonged period of time of recovery even without  complications.  Will need to evaluate the patient's left ventricular  function.  It also appears that he has asymptomatic hypothyroidism with  an elevated TSH of greater than 11.  Considering his overall age, redo  coronary artery bypass would be offered to him if he wished to proceed  with that, but it would also not be unreasonable approach, at a lower  initial risk, to proceed with stenting of the right vein graft.  Obviously, the long term outcome of this would be less predictable but  probably carry a lower initial risk and recovery time, which,  considering his age and medical condition, would not be unreasonable.  The patient and his family are considering their options.  He is  currently maintained on Plavix.   This a Cardiac Surgery Consultation.      Sheliah Plane, MD  Electronically Signed     EG/MEDQ  D:  06/04/2007  T:  06/04/2007  Job:  [id number]   cc:   [name]. Riley Kill, MD, [facility]

## 2011-02-05 NOTE — Consult Note (Signed)
NAMEEUTIMIO, GHARIBIAN                 ACCOUNT NO.:  [id number]   MEDICAL RECORD NO.:  [id number]          PATIENT TYPE:  INP   LOCATION:  3742                         FACILITY:  [facility]   PHYSICIAN:  Cecille Aver, M.D.DATE OF BIRTH:  March 27, 1928   DATE OF CONSULTATION:  06/09/2007  DATE OF DISCHARGE:                                 CONSULTATION   REQUESTING PHYSICIAN:  Dr. Riley Kill.   REASON FOR CONSULTATION:  Hyperkalemia, question type 4 RTA.   HISTORY OF PRESENT ILLNESS:  Mr. Gangemi is a 77 year old white male with a  past medical history significant for diabetes mellitus, hypertension,  hyperlipidemia, prostate cancer status post prostatectomy, as well as  coronary artery disease status post CABG in 1990.  He began having chest  pain and was admitted on June 02, 2007 with chest pain, shortness of  breath, left arm symptoms, and had a witnessed v-fib arrest requiring  shocking.  He was taken emergently to the cath lab and found to have  acute occlusion of the vein graft to his OM, and is status post  angioplasty and stent to that vein graft.  The patient also was found to  have remaining disease in the vein graft to the RCA, and has been kept  in the hospital to determine plan of care on that.  During the course of  the hospitalization, he has acquired some CHF and increased oxygen  requirement.  He has received IV Lasix.  His creatinine has remained  fairly stable throughout these events at 1.3 to 1.5, but potassium has  increased steadily.  Initial potassium was 3.9 for which he received  repletion, but since then it has been over 5 for several days, and is  5.9 today.  We are consulted for question type 4 RTA and management of  above.  The patient states that he feels better after IV Lasix was  given, and he is less short of breath.   PAST MEDICAL HISTORY:  1. Hypertension.  2. Diabetes mellitus.  3. Hyperlipidemia.  4. Prostate cancer, status post prostatectomy.  5.  Coronary artery disease as above.   CURRENT MEDICATIONS:  1. Full dose aspirin 1 a day.  2. Plavix 75 mg a day.  3. Lovenox 40 mg a day.  4. Glucotrol 10 mg a day.  5. Insulin.  6. Toprol 15 mg a day.  7. Zocor 40 mg a day.  8. Potassium 40 mEq that was given on June 03, 2007.  9. The patient was also given a dose of Kayexalate 30 grams today.  10.He has received Lasix over the last 24 hours.   ALLERGIES:  NO KNOWN DRUG ALLERGIES.   SOCIAL HISTORY:  There is no history of tobacco, alcohol, or any drug  use.  The patient seems to be fairly functional.   FAMILY HISTORY:  Negative for renal disease.  No family history of  increased potassium that the patient is aware of.   REVIEW OF SYSTEMS:  Shortness of breath, which is improved.  He is  complaining of diarrhea after Kayexalate.  He is also complaining  of a  decreased appetite.  Review of systems is negative for chest pain,  fever, chills, diaphoresis, lower extremity edema, nausea, vomiting,  hematemesis, bright red blood per rectum, dysuria, frequency, or  urgency.   The remainder of the review of systems is negative.   PHYSICAL EXAMINATION:  VITAL SIGNS:  The patient is afebrile.  Blood  pressure 147/77, heart rate is 59, respirations are 20, oxygen  saturation is 98% on five liters nasal cannula.  Glucose is 122, 154,  and 94.  Urine output for the last couple of shifts has been 1400 and  1700, with 200 so far this shift.  GENERAL:  The patient is pale, chronically ill-appearing.  He is on  nasal cannula oxygen, but sitting up in bed ready to eat lunch.  HEENT:  Pupils equally round and reactive to light.  Extraocular muscles  are intact.  Mucous membranes are moist.  NECK:  There is no significant jugular venous distention.  LUNGS:  Decreased breath sounds at the bases, but good air movement  totally.  CARDIOVASCULAR:  Regular rate and rhythm.  He has extensive bruising on  his right chest.  ABDOMEN:  Positive  bowel sounds.  Soft, nontender.  EXTREMITIES:  Reveal trace to 1+ edema to lower extremities bilaterally.  There is also bruising.  NEURO:  The patient is alert.  The remainder of the neurological exam is  nonfocal.   LABORATORY DATA:  White blood count 10.1, hemoglobin 13.4.  Sodium 136,  potassium 5.9.  The last 4 potassium's are 5.2, 5.5, and 5.4.  Chloride  of 99, bicarbonate of 30, BUN and creatinine 26 and 1.48, with a glucose  of 82, calcium 8.5.  CK initially peaked at 2100, and troponin was  greater than 100 after re-profusion as well as shocks.  TSH is 5.4.   ASSESSMENT:  A 77 year old white male with stage 3 chronic kidney  disease, possibly secondary to diabetes mellitus, now with hyperkalemia  in the setting of acute re-profusion with elevated CK, and is also  status post cardioversion.  1. Hyperkalemia:  With bicarbonate of 30, I do not believe RTA is      playing a role here.  However, I do think that muscle injury that      was caused by the shock as well as re-profusion to the myocardium      has led to muscle breakdown and the bi-product of potassium.  With      relative decrease in GFR, although it is not significant, the      potassium load may have overwhelmed his capability to excrete.  I      think the hyperkalemia should resolve with time.  At this time, I      will make sure he is on a low potassium diet, will continue on low-      dose Lasix because he responds very well and I do not think he is      too volume depleted at this time.  I will check a CK and follow his      potassium level closely.   Regarding just a mini workup for his chronic kidney disease, will check  a urinalysis for proteinuria.   Thank you very much for this consultation.  Will continue to follow with  you.           ______________________________  Cecille Aver, M.D.    KAG/MEDQ  D:  06/09/2007  T:  06/09/2007  Job:  [id number] 

## 2011-02-05 NOTE — Discharge Summary (Signed)
NAMECHRISTIANO, Shawn [name]                 ACCOUNT NO.:  [id number]   MEDICAL RECORD NO.:  [id number]          PATIENT TYPE:  INP   LOCATION:  6529                         FACILITY:  [facility]   PHYSICIAN:  Shawn Morton. Shawn Kill, MD, FACCDATE OF BIRTH:  [birth year]   DATE OF ADMISSION:  06/02/2007  DATE OF DISCHARGE:                               DISCHARGE SUMMARY   PRIMARY CARE PHYSICIAN:  Dr. Fayrene Fearing [name]   CARDIOLOGIST:  Dr. Bonnee [name]   DISCHARGING DIAGNOSES:  1. Coronary artery disease/ST-elevated myocardial infarction with      ventricular fibrillation cardiac arrest/resuscitation with two      consecutive shocks/cardiopulmonary resuscitation/advanced cardiac      life support.      a.     Status post cardiac catheterization initially on June 03, 2007.  The patient with total occlusion of saphenous vein       graft to the obutse marginal with successful reperfusion therapy       using a non-drug-eluting stent.      b.     Status post cardiac catheterization on June 10, 2007.       The patient underwent successful percutaneous stenting of the       saphenous vein graft to the right coronary vessel with diffuse       distal disease otherwise noted.  2. Hypertension.  3. Diabetes.  4. Hyperlipidemia.  5. Left ventricular dysfunction with an ejection fraction of 45% by      cardiac catheterization this admission.  6. Urinalysis with greater than 1000 Klebsiella, with negative urine      culture.  7. Hyperkalemia status post consultation by Dr. Annie [name]      with nephrology.  The patient with hyperkalemia in the setting of      cardiogenic shock.  Otherwise, chronic stage 3 kidney disease,      currently stable.  ARB therapy on hold at time of discharge.   PAST MEDICAL HISTORY:  Also includes:  1. Prostate cancer.  2. Coronary artery disease status post coronary artery bypass      grafting.  3. Diverticulosis.  4. Status post prostatectomy.  5. Lumbar  degenerative disk disease.  6. Treated dyslipidemia.   CONSULTATIONS THIS ADMISSION:  1. Dr. Sheliah [name] on June 04, 2007.  2. Dr. Annie [name] on June 09, 2007.   HOSPITAL COURSE:  [name] is a very pleasant 77 year old Caucasian  gentleman with known history of coronary artery disease status post CABG  in 1990 with a LIMA to the LAD, saphenous vein graft to the OM, and  saphenous vein graft to his right coronary.  [name] also has a history  of chronic back pain secondary to degenerative joint disease.  Apparently had stopped his Plavix and aspirin approximately 5 days prior  to admission in order to have a procedure done to his back.  [name]  presented to [facility] on the day of admission complaining of chest  pain x24 hours that had progressively become more intense and associated  with shortness of  breath, diaphoresis and left-arm numbness and  tingling.  Once in the ER the patient had ventricular fibrillation  arrest and was resuscitated with two consecutive shock, the first one at  120 joules, the second one at 200 joules.  Epinephrine, CPR/ACLS was  initiated.  The patient was taken emergently to the catheterization  laboratory.  In the catheterization laboratory the patient was found to  have total occlusion of the saphenous vein graft to the obtuse marginal.  Underwent successful reperfusion therapy using a non-drug-eluting stent.  He was noted to have continued patency of the internal mammary to the  LAD with diffusely diseased LAD, and continued patency of the saphenous  vein graft to the distal right with high-grade saphenous vein graft  disease.  The patient also had total known occlusion of the circumflex  right coronary artery and left anterior descending artery.  It was  decided to treat the patient medically.  Dr. Tyrone [name] was asked to  review the patient's films for possible consideration of redo surgery.  Dr. Tyrone [name] saw the patient in  consultation on June 04, 2007.  The  patient had no absolute contraindication to a redo surgery, although  with the extent of his distal disease, hi overall health and age, he  felt redo CABG would carry significant risk and a prolonged period of  time of recovery even without complications.  The patient and his family  considered the options.  In the interim, the patient's aspirin and  Plavix continued.   Postcatheterization the patient remained stable.  Blood pressure 126/64.  Kidney function, BUN and creatinine 22 and 1.5, potassium 4.1.  Hemoglobin 13.6.  The patient was stable enough to be transferred to  telemetry.  The patient's family not considering option of bypass.  Dr.  Riley [name] spoke with the patient and family again for consideration of  PCI.  The plan was made to monitor the patient over the weekend and  scheduled PCI the following Tuesday.  The patient was also noted to have  a TSH of 11.3, which was a new finding.  The patient's potassium  continued to climb to 5.4 on June 06, 2007; otherwise, the patient  stable.  He complained of a sore chest from compressions, denied any  shortness of breath.  Lovenox was added for DVT prophylaxis.  BNP/renal  function continued to be monitored closely.  On September 14, potassium  5.5, BUN and creatinine 24 and 1.3.  TSH down to 5.4 (repeat lab).  Blood pressure stable.  On September 15, the patient stated he was not  feeling as well, moderately short of breath.  The patient in mild CHF,  diuresed with IV Lasix with some relief of dyspnea after Lasix given.  The patient's output 1 L after one dose of Lasix.  On September 16, the  patient feeling better.  However, potassium still remained elevated.  The patient given Kayexalate.  Renal asked to consult.  Dr. Kathrene [name]  saw the patient in consultation on September 16, noted the patient's  past medical history with stage 3 chronic kidney disease secondary to  diabetes, now  with hyperkalemia in the setting of acute reperfusion and  status post cardioversion.  She recommended a low potassium diet.  Continue low-dose Lasix and monitor his potassium closely.  A 2-D  echocardiogram obtained that showed an EF 45%.  The patient stable to  proceed with PCI  on June 10, 2007.   The patient to the catheterization laboratory by Dr. Riley [name].  Previously placed stent in the mid portion of the saphenous vein graft  to the OM system remained widely patent.  The patient underwent  successful percutaneous stenting of the saphenous vein graft to the  right coronary artery without complications.  Cardiac rehabilitation in  to work with the patient.  Also, nutrition consult requested secondary  to poor p.o. intake, decreased appetite.  The patient also had  urinalysis done.  Apparently, urinalysis was negative but the urine  culture was positive for gram-negative rods.  No further treatment per  Dr. Riley [name].  The patient continued to remain stable, up ambulating with  cardiac rehab and family.  Dr. Riley [name] in to see the patient on  September 19.  The patient afebrile, blood pressure 137/56, potassium  4.0, BUN 21, creatinine 1.4.  The patient is being discharged home to  follow up with Dr. Riley [name] next week.  I have scheduled him an  appointment with Dr. Riley [name] on September 25 at 11:15 a.m.  He has been  given the post cardiac catheterization discharge instructions.  He has  been given the post myocardial infarction discharge instructions.  I  have spent greater than 20 minutes with the patient reviewing his  medications, the purpose of his medications, and follow up concerns  discussed with the patient and family.   MEDICATIONS AT TIME OF DISCHARGE:  1. Glipizide 10 mg daily.  2. Toprol-XL 50 mg - the patient to start this evening.  3. Aspirin has been increased to 325 mg daily.  4. Plavix 75 mg daily.  5. Furosemide 20 mg daily.  6. Isosorbide 30 mg daily.  7.  Crestor 10 mg daily.  8. Potassium daily.   He has a prescription for the Toprol-XL, the furosemide, potassium and  nitroglycerin.  He has been instructed verbally and in writing to stop  his atenolol, Diovan, nabumetone, and nifedipine.  We will hold off on  Diovan HCT secondary to acute hyperkalemia during this hospitalization.  The patient to follow up with Dr. Riley [name] outpatient for possible  reinitiation of a ACE/ARB therapy.   Duration of discharge encounter is greater than 30 minutes.      Dorian Pod, ACNP      Shawn Morton. Shawn Kill, MD, [facility]  Electronically Signed    MB/MEDQ  D:  06/12/2007  T:  06/12/2007  Job:  [id number]   cc:   Quita Skye. Artis Flock, M.D.

## 2011-02-05 NOTE — Letter (Signed)
May 26, 2008    Quita Skye. Artis Flock, M.D.  [address], [address]  [place], [place] [address]   RE:  [name], [name]  MRN:  [id number]  /  DOB:  03/02/1928   Dear Rosanne Ashing,   I had the pleasure of seeing your very nice patient Shawn [name] in the  office today in followup.  From a clinical standpoint, he is getting  along quite well.  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  Cardiac-wise, he denies any chest pain, shortness of  breath, or increasing fatigue.  His only complaint is that he seems to  bruise a little bit easily.   CURRENT MEDICATIONS:  1. Glipizide ER 10 mg daily.  2. Crestor 10 mg daily.  3. Plavix 75 mg daily.  4. Isosorbide ER 30 mg daily.  5. Potassium chloride 10 mEq daily.  6. Furosemide 20 mg daily.  7. Metoprolol succinate 50 mg one daily.  8. Aspirin 325 mg daily.  9. Multivitamin.  10.Norvasc 7.5 mg daily.  11.Finasteride 5 mg daily.   PHYSICAL EXAMINATION:  GENERAL:  He is alert and oriented.  VITAL SIGNS:  His weight is 222 pounds, blood pressure 134/70, and the  pulse is 64.  LUNGS:  Lung fields are clear.  CARDIAC:  Rhythm is regular.  There is a very soft apical murmur that  would be 1-2/6 maximum.  EXTREMITIES:  Easy bruisability with a fair amount of ecchymosis.   His electrocardiogram demonstrates normal sinus rhythm with minor  nonspecific T-wave abnormality.  There is borderline prolonged QTC.   Overall, the patient is stable.  He should continue on his current  medical regimen, which has seemingly served him well from a clinical  standpoint.  He is out now nearly a year from his prior procedure.  We  will get some basic laboratory work on him today to make sure that his  potassium and magnesium are intact.  I appreciate the opportunity of  sharing in this gentleman's care and we will see him back in 3-4 months  time.    Sincerely,      [name]. Riley Kill, MD, [facility]  Electronically Signed    TDS/MedQ  DD: 05/27/2008  DT: 05/27/2008  Job #: [id number]

## 2011-02-08 NOTE — Assessment & Plan Note (Signed)
[facility]                            CARDIOLOGY OFFICE NOTE   OKEY, ZELEK                        MRN:          [id number]  DATE:12/11/2006                            DOB:          1928-07-24    [name] is in for followup.  He has felt miserable really since about  mid February.  He has had two courses of antibiotics and he says he has  had a cold for about a month now.  He seems to be just about ready to  come around the corner.  He has been using a blood pressure cuff at home  and his blood pressures have been under a reasonable level of control at  this point in time.   He has followed up with Dr. Artis Flock closely.   MEDICATIONS:  1. Glipizide 10 mg daily.  2. Nifedical XL 60 mg daily.  3. Atenolol 50 mg one-half tablet daily.  4. Crestor 10 mg daily.  5. Plavix 75 mg daily.  6. Nabumetone daily.  7. Diovan/hydrochlorothiazide 80/12.5 daily.  8. Isosorbide mononitrate 30 mg daily.  9. Aspirin 81 mg daily.  10.Multivitamin.   PHYSICAL EXAMINATION:  VITAL SIGNS:  His examination is remarkable for  about an eight pound weight loss, from 247 to 239.  The blood pressure  is 178/79.  On repeat by me today, it is 150/70.  Pulse 52.  LUNGS:  Fields do not reveal significant rales.   His electrocardiogram today reveals sinus bradycardia with nonspecific T  wave flattening.  There are no significant abnormalities.   Overall, he seems to be improved.  I am perhaps concerned, but his  symptoms seem to be getting better.  If he does not improve, he will  need to be reassessed and he is following up closely with Dr. Artis Flock.     Shawn [name]. Riley Kill, MD, [facility]  Electronically Signed    TDS/MedQ  DD: 12/26/2006  DT: 12/26/2006  Job #: [id number]

## 2011-02-08 NOTE — Cardiovascular Report (Signed)
NAME:  Shawn [name], Shawn [name]                           ACCOUNT NO.:  [id number]   MEDICAL RECORD NO.:  [id number]                   PATIENT TYPE:  OIB   LOCATION:  2899                                 FACILITY:  [facility]   PHYSICIAN:  [name]. Riley Kill, M.D. [facility]         DATE OF BIRTH:  [birth year]   DATE OF PROCEDURE:  02/13/2004  DATE OF DISCHARGE:  02/13/2004                              CARDIAC CATHETERIZATION   INDICATIONS:  Mr. Holquin is well known to me.  He is a 77 year old gentleman  who has had previous revascularization surgery.  He recently had a  Cardiolite which suggested anterior ischemia.  He has had moderate  symptomatology.  Because of this, he is brought to the cath lab for further  evaluation and treatment.  He does have very mild elevation of his BUN and  creatinine.  He was given intravenous hydration prior to the procedure and  we used Visipaque.   PROCEDURES:  1. Left heart catheterization.  2. Selective coronary arteriography.  3. Saphenous vein graft angiography.  4. Internal mammary angiography.   DESCRIPTION OF PROCEDURE:  The patient was brought to the catheterization  lab and prepped and draped in the usual fashion.  Through an anterior  puncture, the right femoral artery was easily entered.  A 6 French sheath  was placed.  We initially took views of the left and right coronary  arteries.  Vein graft angiography was then performed without complication.  We had some difficulty getting to the internal mammary location.  We were  ultimately successful in getting to this.  Visipaque contrast was utilized  and the patient was hydrated well prior to coming to the laboratory. There  was only mild elevation in the serum creatinine.  He tolerated the procedure  well and was taken to the holding area in satisfactory clinical condition.  Dr. Geralynn Rile and I then subsequently reviewed the pictures.   HEMODYNAMIC DATA:  1. Central aorta:  155/67, mean 100.  2. Left  ventricle:  150/18.  3. No gradient on pullback across the aortic valve.   ANGIOGRAPHIC DATA:  1. The left main coronary artery was severely diseased.  There is about 90%     narrowing crossing the take off of the circumflex and then a 90% proximal     stenosis.  The vessel then fills to septal perforators, then has another     90% stenosis whereon there is competitive filling distally.  2. The circumflex proper is totally occluded proximally.  3. The right coronary artery proper is totally occluded.  4. The internal mammary to the LAD is widely patent, but the LAD is severely     diseased.  There are multiple lesions and diffuse diabetic changes     throughout the course of the vessel.  There is a faintly filling diagonal     branch distally.  There are two proximal diagonal branches.  All of  these     appear to be about 1 mm in size probably from diffuse diabetic     vasculopathy.  5. The saphenous vein graft to the OM is intact.  There is about a 50% area     of narrowing in the proximal vein graft followed by a 60% stenosis.     There is some typical diabetic changes distally in the obtuse marginal.     The vessel fills retrograde into a second additional marginal.  There is     filling of the AV circumflex by retrograde collaterals both from this     marginal and the distal right circulation.  6. The saphenous vein graft to the distal right has a lot of diffuse changes     of irregularity.  There is a mid 50% stenosis best seen in the last     image.  It fills the PDA and the posterior lateral system and then into     an AV circumflex.  Again, typical diabetic changes are seen although not     as severely in the right coronary vessel.   CONCLUSION:  1. Well-preserved overall left ventricular function as demonstrated by     radionuclide imaging.  2. Anterior ischemia as demonstrated by radionuclide imaging.  3. Continued patency of the internal mammary to the left anterior  descending     with severe diffuse vascular changes within the left anterior descending     and diagonal distribution which does not appear to be ideal for     revascularization or percutaneous intervention.  4. Continued patency of the saphenous vein graft to the obtuse marginal with     moderate vein graft stenoses as described above.  5. Moderate irregularity of the right coronary graft into the posterior     descending artery as noted above.   DISPOSITION:  This is a difficult situation.  The patient does have advanced  age. The mammary artery is patent and the ischemia is in the territory of  the anterior wall.  Unfortunately, the LAD itself is severely diseased and a  very poor vessel for either percutaneous intervention or revascularization  surgery.  Likewise, there are early changes of degenerative nature in both  vein grafts.  These could be redone, although the wisdom of doing this where  the ischemia is predominantly in the anterior segment would be questioned.  Continued medical approach will be most likely our approach here.                                               [name]. Riley Kill, M.D. [facility]    TDS/MEDQ  D:  02/13/2004  T:  02/14/2004  Job:  [id number]   cc:   Quita Skye. Artis Flock, M.D.  [address], [address]  [place]  [place] [address]  Fax: [phone]

## 2011-02-08 NOTE — Letter (Signed)
August 04, 2006    Quita Skye. Artis Flock, M.D.  [address]  [place], [place] [address]   RE:  Shawn [name], Shawn [name]  MRN:  [id number]  /  DOB:  Feb 21, 1928   Dear Rosanne Ashing,   I had the pleasure of seeing your nice patient, Shawn [name], in the office  today in follow up.  In general, there is not significant change in his  status.  He does have some mild shortness of breath with exertion, and as  you know, does have some diffuse vein graft disease.  He has, however, not  had any increased exertional chest discomfort.  I noted in his chart that  his weight is gradually increased from about 233 to 247 over the past couple  of years.  He says that his dietary habits have not been great.  He does not  exercise too much as well.   His medications including glipizide 10 mg daily, Nifedical XL 60 mg daily,  Atenolol 50 mg one half tablet daily, Crestor 10 mg daily, Plavix 75 mg  daily, Diovan HCTZ 80/12.5 daily, Isosorbide 30 mg daily, aspirin 81 mg  daily, multivitamin daily.   On physical examination, he is an alert and oriented gentleman in no  distress.  Today, the blood pressure was 160/80, pulse 65.  Lung fields were  clear and the cardiac rhythm was regular.  We did not do an  electrocardiogram today.   In general, Shawn [name] is stable.  He seems to be doing reasonably well.  I  will have him plan to return to see [place] in follow up in about four months.  I  have asked him to keep a close eye on his blood pressures over the next 4-6  weeks and return to see you for blood pressure follow up.  Importantly, his  blood pressures were higher today, although, they have not been consistently  higher, and he is going to check them on a twice weekly basis.  I have also  encouraged him to  try to think about ways in which he can reduce his calories to try to  achieve weights similar to what he has had in the past.  This certainly  could be affecting some of his symptoms.  Should there be a change  in his  status, he knows to contact me promptly.  I will be happy to see him at any  time.  Thanks again for allowing [place] to share in his care.    Sincerely,      [name]. Riley Kill, MD, [facility]  Electronically Signed    TDS/MedQ  DD: 08/04/2006  DT: 08/04/2006  Job #: [id number]

## 2011-02-08 NOTE — Assessment & Plan Note (Signed)
[facility]                              CARDIOLOGY OFFICE NOTE   ARAD, BURSTON                        MRN:          [id number]  DATE:05/29/2006                            DOB:          1928-05-03    Mr. Cupps is in for a follow up visit.  He has, perhaps, a very mild degree  of shortness of breath with activity but, overall, this has not progressed  substantially.  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  The patient  underwent catheterization last in May 2005.  At that time, the left main was  severely diseased and the right was totally occluded as was the circumflex.  The saphenous vein graft to the OM was intact but there were 50% and 60%  lesions, although there was diabetic changes in the obtuse marginal  distally.  There was also a lot of diffuse irregularity in the saphenous  vein graft to the distal right.  The internal mammary to the LAD was patent,  but there was diffuse vascular changes within the LAD which does not appear  to be ideal, so therefore, continued medical therapy was felt to be  warranted.   Medications are Glipizide 10 mg daily, Nifediac 60 mg daily, Atenolol 50 mg  1/2 tablet daily, Crestor 10 mg daily, Plavix 75 mg daily, nabumetone 500  two daily, Diovan HCTZ 80/12.5, isosorbide mononitrate 30  mg daily, aspirin  81 mg daily, and multi-vitamin daily.   On physical, the blood pressure is 140/60, pulse 58, weight 246 pounds.  The  lung fields are clear.  The cardiac rhythm is regular.   The electrocardiogram demonstrates sinus bradycardia, otherwise, within  normal limits.   Mr. Riel is a gentleman with advanced diabetic disease with continued graft  patency.  He has not had an acute coronary syndrome.  He is on a multiple  medical regimen which includes dual anti-platelet therapy, beta blockade,  ARB, and lipid lowering therapy.  His  mammary is intact although his distal  LAD is diffusely diseased.  His vein grafts had degenerative changes, but  the distal portion of the circumflex system has a typical diabetic  appearance.  Continued medical regimen will be recommended for him in the  absence of presentation with an acute coronary syndrome given the somewhat  limited revascularization options available which, under the circumstances,  would be high risk at best.  We will continue to follow him and see him back  in follow up in six months.     [name]. Riley Kill, MD, [facility]  Electronically Signed    TDS/MedQ  DD: 07/26/2006  DT: 07/26/2006  Job #: [id number]

## 2011-02-08 NOTE — Discharge Summary (Signed)
   NAME:  Shawn [name], Shawn [name]                           ACCOUNT NO.:  [id number]   MEDICAL RECORD NO.:  [id number]                   PATIENT TYPE:  INP   LOCATION:  0353                                 FACILITY:  [facility]   PHYSICIAN:  Lorre Munroe., M.D.            DATE OF BIRTH:  12-24-1927   DATE OF ADMISSION:  11/16/2002  DATE OF DISCHARGE:  11/18/2002                                 DISCHARGE SUMMARY   HISTORY:  The patient is a 77 year old man who had a rather acute onset of  abdominal cramping and pain and vomiting.  Abdominal x-rays and CT scans  which were obtained when he sought care at the emergency department showed  evidence of small-bowel obstruction.  There is a past history of a radical  prostatectomy and an appendectomy.  He has no chronic GI problems.  He is  diabetic controlled by Glucotrol and diet.  He also has high blood pressure  and has had coronary artery bypass for coronary artery disease.   MEDICATIONS:  Diovan, Procardia, atenolol, Pravachol, nabumetone, and  aspirin.   REVIEW OF SYMPTOMS:  Unremarkable.   PHYSICAL EXAMINATION:  There was a healed sternotomy.  The abdomen was  slightly tender and there were normal bowel sounds with no hernia.   LABORATORIES:  White count was 15,000.  Creatinine was 1.8.   HOSPITAL COURSE:  The patient was felt to have small-bowel obstruction.  He  was hospitalized, given IV fluids and kept n.p.o.  Overnight he improved a  good bit and began to pass some gas.  X-rays showed a resolving picture of  small-bowel obstruction and his white count fell to 10,000.  I gave him food  and he tolerated it well.  He had no further vomiting and his pain resolved.  By the next day abdomen was soft, his bowels were moving and the obstruction  was felt to have resolved.  It was felt probably to have been due to  adhesions or possibly an acute GI illness.  I sent him home without  restriction regarding diet or activity and asked him to call  me if there was  any further problem.   DIAGNOSES:  1. Small intestinal obstruction, resolved.  2. Coronary artery disease, stable.  3. Hypertension.  4. Diabetes, type 2.   OPERATIONS:  None.   DISCHARGE CONDITION:  Improved.                                               Lorre Munroe., M.D.    Jodi Marble  D:  11/25/2002  T:  11/25/2002  Job:  [id number]

## 2011-03-22 ENCOUNTER — Other Ambulatory Visit: Payer: Self-pay | Admitting: Cardiology

## 2011-04-08 ENCOUNTER — Ambulatory Visit (INDEPENDENT_AMBULATORY_CARE_PROVIDER_SITE_OTHER): Payer: Medicare Other | Admitting: Cardiology

## 2011-04-08 ENCOUNTER — Encounter: Payer: Self-pay | Admitting: Cardiology

## 2011-04-08 VITALS — BP 156/80 | HR 65 | Resp 18 | Ht 71.0 in | Wt 211.0 lb

## 2011-04-08 DIAGNOSIS — I2510 Atherosclerotic heart disease of native coronary artery without angina pectoris: Secondary | ICD-10-CM

## 2011-04-08 DIAGNOSIS — I4891 Unspecified atrial fibrillation: Secondary | ICD-10-CM

## 2011-04-08 DIAGNOSIS — Z136 Encounter for screening for cardiovascular disorders: Secondary | ICD-10-CM

## 2011-04-08 DIAGNOSIS — I10 Essential (primary) hypertension: Secondary | ICD-10-CM

## 2011-04-08 LAB — BASIC METABOLIC PANEL
BUN: 22 mg/dL (ref 6–23)
CO2: 29 mEq/L (ref 19–32)
Glucose, Bld: 83 mg/dL (ref 70–99)
Potassium: 4.6 mEq/L (ref 3.5–5.1)
Sodium: 136 mEq/L (ref 135–145)

## 2011-04-08 NOTE — Assessment & Plan Note (Signed)
Has cough, likely secondary to ACE---clearing throat, etc.  No sputum production.  CXR January was ok.

## 2011-04-08 NOTE — Assessment & Plan Note (Signed)
No current chest pain.  Holding his own.  Continue medical therapy.

## 2011-04-08 NOTE — Assessment & Plan Note (Signed)
Maintaining NSR at present.  No change presently.  Discussed Pradaxa

## 2011-04-08 NOTE — Progress Notes (Signed)
HPI:  [name] is about the same.  He has been stable, and about the same.  No chest pain, or progression of shortness of breath.  He has to clear his throat a lot.    Current Outpatient Prescriptions  Medication Sig Dispense Refill  . amiodarone (PACERONE) 200 MG tablet Take 200 mg by mouth daily.        Marland Kitchen aspirin 81 MG tablet Take 81 mg by mouth daily.        . finasteride (PROSCAR) 5 MG tablet Take 1 tablet by mouth Daily.      . furosemide (LASIX) 20 MG tablet take 1 tablet by mouth once daily  30 tablet  8  . glipiZIDE (GLUCOTROL) 10 MG tablet Take 10 mg by mouth daily.        . isosorbide mononitrate (IMDUR) 30 MG 24 hr tablet Take 30 mg by mouth daily.        Marland Kitchen KLOR-CON M10 10 MEQ tablet take 1 tablet by mouth once daily  30 tablet  2  . levothyroxine (SYNTHROID, LEVOTHROID) 25 MCG tablet Take 1 tablet by mouth Daily.      Marland Kitchen lisinopril (PRINIVIL,ZESTRIL) 5 MG tablet Take 1 tablet (5 mg total) by mouth daily.  30 tablet  6  . Multiple Vitamin (MULTIVITAMIN) tablet Take 1 tablet by mouth daily.        . nitroGLYCERIN (NITROSTAT) 0.4 MG SL tablet Place 0.4 mg under the tongue every 5 (five) minutes as needed.        . potassium chloride (KLOR-CON) 10 MEQ CR tablet Take 10 mEq by mouth daily.        Marland Kitchen PRADAXA 150 MG CAPS take 1 capsule by mouth twice a day  60 capsule  3  . predniSONE (DELTASONE) 5 MG tablet Take 2.5 mg by mouth daily.       . rosuvastatin (CRESTOR) 10 MG tablet Take 10 mg by mouth daily.          No Known Allergies  Past Medical History  Diagnosis Date  . CAD (coronary artery disease)      BYPASS GRAFT EF 40-45% (ICD-414.02)  . HTN (hypertension)   . Hypercholesteremia   . Diabetes mellitus   . Atrial fibrillation   . Tachycardia   . Sinus bradycardia   . Renal disease   . Edema   . Back pain   . Prostate cancer     Past Surgical History  Procedure Date  . Coronary artery bypass graft 1990    No family history on file.  History   Social History  .  Marital Status: Widowed    Spouse Name: N/A    Number of Children: N/A  . Years of Education: N/A   Occupational History  . Not on file.   Social History Main Topics  . Smoking status: Former Games developer  . Smokeless tobacco: Not on file   Comment: quit 20+ years ago  . Alcohol Use: No  . Drug Use: No  . Sexually Active: Not on file   Other Topics Concern  . Not on file   Social History Narrative     The patient resides in [place] alone.  He is  widowed.  He has 4 children, 9 grandchildren.   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  He denies any alcohol, drugs, or herbal medication.  He tries  to maintain a low-fat diet.  He  states that he does exercise somewhat  with walking, and he uses a stationary bike for a few minutes every  other day.       ROS: Please see the HPI.  All other systems reviewed and negative.  PHYSICAL EXAM:  BP 156/80  Pulse 65  Resp 18  Ht 5\' 11"  (1.803 m)  Wt 211 lb (95.709 kg)  BMI 29.43 kg/m2  General: Well developed, well nourished, in no acute distress. Head:  Normocephalic and atraumatic. Neck: no JVD Lungs: Clear to auscultation and percussion.  Nor rales.   Heart: Normal S1 and S2.  No murmur, rubs or gallops. No obvious apical murmur. Abdomen:  Normal bowel sounds; soft; non tender; no organomegaly Pulses: Pulses normal in all 4 extremities. Extremities: No clubbing or cyanosis. No edema.  Bruising on the arms. Neurologic: Alert and oriented x 3.  EKG:  NSR.  Nonspecific ST and T changes.   ASSESSMENT AND PLAN:

## 2011-04-08 NOTE — Patient Instructions (Signed)
Your physician recommends that you schedule a follow-up appointment in: 2 months  

## 2011-04-17 NOTE — Telephone Encounter (Signed)
Test result

## 2011-04-24 ENCOUNTER — Other Ambulatory Visit: Payer: Self-pay | Admitting: Cardiology

## 2011-05-22 ENCOUNTER — Other Ambulatory Visit: Payer: Self-pay | Admitting: Cardiology

## 2011-06-04 ENCOUNTER — Ambulatory Visit: Payer: Medicare Other | Admitting: Cardiology

## 2011-06-05 ENCOUNTER — Encounter: Payer: Self-pay | Admitting: Cardiology

## 2011-06-05 ENCOUNTER — Ambulatory Visit (INDEPENDENT_AMBULATORY_CARE_PROVIDER_SITE_OTHER): Payer: Medicare Other | Admitting: Cardiology

## 2011-06-05 ENCOUNTER — Ambulatory Visit (INDEPENDENT_AMBULATORY_CARE_PROVIDER_SITE_OTHER)
Admission: RE | Admit: 2011-06-05 | Discharge: 2011-06-05 | Disposition: A | Discharge status: Home / Self Care | Payer: Medicare Other | Source: Ambulatory Visit | Attending: Cardiology | Admitting: Cardiology

## 2011-06-05 DIAGNOSIS — R05 Cough: Secondary | ICD-10-CM

## 2011-06-05 DIAGNOSIS — E039 Hypothyroidism, unspecified: Secondary | ICD-10-CM | POA: Insufficient documentation

## 2011-06-05 DIAGNOSIS — I2510 Atherosclerotic heart disease of native coronary artery without angina pectoris: Secondary | ICD-10-CM

## 2011-06-05 DIAGNOSIS — I4891 Unspecified atrial fibrillation: Secondary | ICD-10-CM

## 2011-06-05 NOTE — Patient Instructions (Addendum)
Your physician recommends that you return for lab work in: today  (BMET, CBC, Liver panel, TSH)  A chest x-ray takes a picture of the organs and structures inside the chest, including the heart, lungs, and blood vessels. This test can show several things, including, whether the heart is enlarges; whether fluid is building up in the lungs; and whether pacemaker / defibrillator leads are still in place.  Your physician recommends that you schedule a follow-up appointment in: 2 weeks  Your physician has recommended you make the following change in your medication: stop your amiodarone  You have been referred to  Dr Graciela Husbands

## 2011-06-05 NOTE — Assessment & Plan Note (Signed)
Was hypothyroid when checked in March and I called Dr. Jacky Kindle.  Patient was started on synthroid, but cannot remember if he has had follow up labs at this point in time.  Will check with Jonathon Bellows my note in [facility]--and see if they have more labs.  Will recheck TSH as he is on low dose amiodarone.  We will stop the amiodarone at this point.

## 2011-06-05 NOTE — Progress Notes (Signed)
HPI:  Patient has not been sleeping all that well.  He denies chest pain or shortness of breath.  He has been having some dreams.  He has also had a dry cough as before.  He continues to have some dizziness.   He feels like his ebualibrium is off.   His TSH was high and he was started on thyroid replacment back in March by Dr. Jacky Kindle, and we do not have his labs from follow up in their office.  His LFTs were ok here. He has lost weight, but has not been hungry.  He has not seen Dr. Graciela Husbands back since I last saw him.  He says he stays dizzy all of the time, his legs are weak, he has bad dreams and a poor appetite, and he is eating junk food mainly.   Current Outpatient Prescriptions  Medication Sig Dispense Refill  . amiodarone (PACERONE) 200 MG tablet        . aspirin 81 MG tablet Take 81 mg by mouth daily.        . finasteride (PROSCAR) 5 MG tablet Take 1 tablet by mouth Daily.      . furosemide (LASIX) 20 MG tablet take 1 tablet by mouth once daily  30 tablet  8  . glipiZIDE (GLUCOTROL) 10 MG tablet Take 10 mg by mouth daily.        . isosorbide mononitrate (IMDUR) 30 MG 24 hr tablet Take 30 mg by mouth daily.        Marland Kitchen KLOR-CON M10 10 MEQ tablet take 1 tablet by mouth once daily  30 tablet  2  . levothyroxine (SYNTHROID, LEVOTHROID) 25 MCG tablet Take 1 tablet by mouth Daily.      Marland Kitchen lisinopril (PRINIVIL,ZESTRIL) 5 MG tablet Take 1 tablet (5 mg total) by mouth daily.  30 tablet  6  . Multiple Vitamin (MULTIVITAMIN) tablet Take 1 tablet by mouth daily.        . nitroGLYCERIN (NITROSTAT) 0.4 MG SL tablet Place 0.4 mg under the tongue every 5 (five) minutes as needed.        Marland Kitchen PRADAXA 150 MG CAPS take 1 capsule by mouth twice a day  60 capsule  3  . predniSONE (DELTASONE) 5 MG tablet 5 mg. Take one every other day      . rosuvastatin (CRESTOR) 10 MG tablet Take 10 mg by mouth daily.          No Known Allergies  Past Medical History  Diagnosis Date  . CAD (coronary artery disease)      BYPASS  GRAFT EF 40-45% (ICD-414.02)  . HTN (hypertension)   . Hypercholesteremia   . Diabetes mellitus   . Atrial fibrillation   . Tachycardia   . Sinus bradycardia   . Renal disease   . Edema   . Back pain   . Prostate cancer     Past Surgical History  Procedure Date  . Coronary artery bypass graft 1990    No family history on file.  History   Social History  . Marital Status: Widowed    Spouse Name: N/A    Number of Children: N/A  . Years of Education: N/A   Occupational History  . Not on file.   Social History Main Topics  . Smoking status: Former Games developer  . Smokeless tobacco: Not on file   Comment: quit 20+ years ago  . Alcohol Use: No  . Drug Use: No  . Sexually Active: Not on file  Other Topics Concern  . Not on file   Social History Narrative     The patient resides in [place] alone.  He is  widowed.  He has 4 children, 9 grandchildren.   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  He denies any alcohol, drugs, or herbal medication.  He tries  to maintain a low-fat diet.  He states that he does exercise somewhat  with walking, and he uses a stationary bike for a few minutes every  other day.       ROS: Please see the HPI.  All other systems reviewed and negative.  PHYSICAL EXAM:  BP 178/102  Pulse 81  Ht 5\' 11"  (1.803 m)  Wt 205 lb (92.987 kg)  BMI 28.59 kg/m2  General: Well developed, well nourished, in no acute distress. Head:  Normocephalic and atraumatic. Neck: no JVD Lungs: Clear to auscultation and percussion.  No rales on exam.   Heart: Normal S1 and S2.  S4 gallop.   Abdomen:  Normal bowel sounds; soft; non tender; no organomegaly Pulses: Pulses normal in all 4 extremities. Extremities: No clubbing or cyanosis. No edema. Neurologic: Alert and oriented x 3.  EKG:  NSR.  ILBBB.  Normal PR.   ASSESSMENT AND PLAN:

## 2011-06-05 NOTE — Assessment & Plan Note (Signed)
Mild and very dry.  No rales.  Doubt amio lung, but will check a chest xray and d/c amiodarone.

## 2011-06-05 NOTE — Assessment & Plan Note (Signed)
See Dr. Odessa Fleming commentary in [place].  Started on amiodarone.  Will recheck status at this point, recheck CXR, TSH, liver profile.

## 2011-06-06 ENCOUNTER — Encounter: Payer: Self-pay | Admitting: Cardiology

## 2011-06-06 LAB — CBC WITH DIFFERENTIAL/PLATELET
Basophils Absolute: 0 10*3/uL (ref 0.0–0.1)
Eosinophils Absolute: 0 10*3/uL (ref 0.0–0.7)
Lymphocytes Relative: 15.2 % (ref 12.0–46.0)
MCHC: 33 g/dL (ref 30.0–36.0)
MCV: 90.3 fl (ref 78.0–100.0)
Monocytes Absolute: 0.3 10*3/uL (ref 0.1–1.0)
Neutrophils Relative %: 81.5 % — ABNORMAL HIGH (ref 43.0–77.0)
Platelets: 263 10*3/uL (ref 150.0–400.0)
RDW: 14.4 % (ref 11.5–14.6)

## 2011-06-06 LAB — BASIC METABOLIC PANEL
BUN: 21 mg/dL (ref 6–23)
CO2: 27 mEq/L (ref 19–32)
Calcium: 9.3 mg/dL (ref 8.4–10.5)
Chloride: 104 mEq/L (ref 96–112)
Creatinine, Ser: 1.4 mg/dL (ref 0.4–1.5)

## 2011-06-06 LAB — HEPATIC FUNCTION PANEL
Bilirubin, Direct: 0.2 mg/dL (ref 0.0–0.3)
Total Bilirubin: 1 mg/dL (ref 0.3–1.2)
Total Protein: 7 g/dL (ref 6.0–8.3)

## 2011-06-09 NOTE — Assessment & Plan Note (Signed)
No current chest pain

## 2011-06-10 ENCOUNTER — Telehealth: Payer: Self-pay | Admitting: Cardiology

## 2011-06-10 NOTE — Telephone Encounter (Signed)
Pt wants to know about ok to see dentist after sept 26th please call him back and let him know

## 2011-06-10 NOTE — Telephone Encounter (Signed)
Left message for pt to call back  °

## 2011-06-11 NOTE — Telephone Encounter (Signed)
I spoke with the pt and he is scheduled to see Dr Riley Kill on 06/19/11.  The pt said that at his last appointment Dr Riley Kill asked him to hold off on dental work at this time because he would have to hold pradaxa.  The pt would like to go ahead and get dental work scheduled.  I told the pt that he could go ahead and schedule dental work in October pending final clearance from Dr Riley Kill at his 9/26 appointment.  The pt said he needs dental implants.  The pt agreed with plan and will discuss dental work and holding pradaxa at his upcoming appointment.

## 2011-06-19 ENCOUNTER — Ambulatory Visit (INDEPENDENT_AMBULATORY_CARE_PROVIDER_SITE_OTHER): Payer: Medicare Other | Admitting: Cardiology

## 2011-06-19 ENCOUNTER — Encounter: Payer: Self-pay | Admitting: Cardiology

## 2011-06-19 DIAGNOSIS — I4891 Unspecified atrial fibrillation: Secondary | ICD-10-CM

## 2011-06-19 DIAGNOSIS — E039 Hypothyroidism, unspecified: Secondary | ICD-10-CM

## 2011-06-19 DIAGNOSIS — I2510 Atherosclerotic heart disease of native coronary artery without angina pectoris: Secondary | ICD-10-CM

## 2011-06-19 DIAGNOSIS — R05 Cough: Secondary | ICD-10-CM

## 2011-06-19 DIAGNOSIS — R42 Dizziness and giddiness: Secondary | ICD-10-CM

## 2011-06-19 DIAGNOSIS — I951 Orthostatic hypotension: Secondary | ICD-10-CM

## 2011-06-19 MED ORDER — LISINOPRIL 10 MG PO TABS: 10.0000 mg | ORAL_TABLET | Freq: Every day | ORAL | Status: DC

## 2011-06-19 MED ORDER — LEVOTHYROXINE SODIUM 50 MCG PO TABS: 50.0000 ug | ORAL_TABLET | Freq: Every day | ORAL | Status: DC

## 2011-06-19 NOTE — Patient Instructions (Signed)
Your physician has recommended you make the following change in your medication: INCREASE Lisinopril to 10mg  once a day, INCREASE Synthroid to once a day, STOP Isosorbide MN  Non-Cardiac CT scanning, (CAT scanning), is a noninvasive, special x-ray that produces cross-sectional images of the body using x-rays and a computer. CT scans help physicians diagnose and treat medical conditions. For some CT exams, a contrast material is used to enhance visibility in the area of the body being studied. CT scans provide greater clarity and reveal more details than regular x-ray exams--Non contrast CT of head   Your physician recommends that you schedule a follow-up appointment in: 3 WEEKS  Please hold off on dental work at this time.

## 2011-06-20 ENCOUNTER — Ambulatory Visit (INDEPENDENT_AMBULATORY_CARE_PROVIDER_SITE_OTHER)
Admission: RE | Admit: 2011-06-20 | Discharge: 2011-06-20 | Disposition: A | Discharge status: Home / Self Care | Payer: Medicare Other | Source: Ambulatory Visit | Attending: Cardiology | Admitting: Cardiology

## 2011-06-20 DIAGNOSIS — R42 Dizziness and giddiness: Secondary | ICD-10-CM

## 2011-06-21 DIAGNOSIS — I951 Orthostatic hypotension: Secondary | ICD-10-CM | POA: Insufficient documentation

## 2011-06-21 DIAGNOSIS — R42 Dizziness and giddiness: Secondary | ICD-10-CM | POA: Insufficient documentation

## 2011-06-21 NOTE — Assessment & Plan Note (Signed)
Slightly hypothyroid, so will increase dose slightly, and coordinate again with Dr. Jacky Kindle.

## 2011-06-21 NOTE — Assessment & Plan Note (Signed)
Currently on medication, and not clearly in and out of rhythm.

## 2011-06-21 NOTE — Assessment & Plan Note (Signed)
No current symptoms at present.  No chest pain.  No changes at present to make here.

## 2011-06-21 NOTE — Progress Notes (Signed)
HPI:  [name] may be a bit better, but still feels dizzy.  He feels unsteady when he walks at times.  He is not sure as to the cause of the dizziness, but it has been persistent.  He feels funny when he gets into the shower.  He also may be a bit worse when he gets up in the morning.  But it is there fairly consistently.  He is not particularly short of breath, nor does he have chest pain.    Current Outpatient Prescriptions  Medication Sig Dispense Refill  . aspirin 81 MG tablet Take 81 mg by mouth daily.        . finasteride (PROSCAR) 5 MG tablet Take 1 tablet by mouth Daily.      . furosemide (LASIX) 20 MG tablet take 1 tablet by mouth once daily  30 tablet  8  . glipiZIDE (GLUCOTROL) 10 MG tablet Take 10 mg by mouth daily.        Marland Kitchen KLOR-CON M10 10 MEQ tablet take 1 tablet by mouth once daily  30 tablet  2  . levothyroxine (SYNTHROID, LEVOTHROID) 50 MCG tablet Take 1 tablet (50 mcg total) by mouth daily.  30 tablet  2  . lisinopril (PRINIVIL,ZESTRIL) 10 MG tablet Take 1 tablet (10 mg total) by mouth daily.  30 tablet  6  . Multiple Vitamin (MULTIVITAMIN) tablet Take 1 tablet by mouth daily.        . nitroGLYCERIN (NITROSTAT) 0.4 MG SL tablet Place 0.4 mg under the tongue every 5 (five) minutes as needed.        Marland Kitchen PRADAXA 150 MG CAPS take 1 capsule by mouth twice a day  60 capsule  3  . predniSONE (DELTASONE) 5 MG tablet Take one every other day      . rosuvastatin (CRESTOR) 10 MG tablet Take 10 mg by mouth daily.          No Known Allergies  Past Medical History  Diagnosis Date  . CAD (coronary artery disease)      BYPASS GRAFT EF 40-45% (ICD-414.02)  . HTN (hypertension)   . Hypercholesteremia   . Diabetes mellitus   . Atrial fibrillation   . Tachycardia   . Sinus bradycardia   . Renal disease   . Edema   . Back pain   . Prostate cancer     Past Surgical History  Procedure Date  . Coronary artery bypass graft 1990    No family history on file.  History   Social  History  . Marital Status: Widowed    Spouse Name: N/A    Number of Children: N/A  . Years of Education: N/A   Occupational History  . Not on file.   Social History Main Topics  . Smoking status: Former Games developer  . Smokeless tobacco: Not on file   Comment: quit 20+ years ago  . Alcohol Use: No  . Drug Use: No  . Sexually Active: Not on file   Other Topics Concern  . Not on file   Social History Narrative     The patient resides in [place] alone.  He is  widowed.  He has 4 children, 9 grandchildren.   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  He denies any alcohol, drugs, or herbal medication.  He tries  to maintain a low-fat diet.  He states that he does exercise somewhat  with walking, and he  uses a stationary bike for a few minutes every  other day.       ROS: Please see the HPI.  All other systems reviewed and negative.  PHYSICAL EXAM:  BP 165/82  Pulse 71  Ht 5\' 9"  (1.753 m)  Wt 212 lb (96.163 kg)  BMI 31.31 kg/m2  BP by me  165 supine, 150 after sitting for ten minutes.    General: Well developed, well nourished, in no acute distress. Head:  Normocephalic and atraumatic. Neck: no JVD Lungs: Clear to auscultation and percussion. Heart: Normal S1 and S2.  SEM.  Abdomen:  Normal bowel sounds; soft; non tender; no organomegaly Pulses: Pulses normal in all 4 extremities. Extremities: No clubbing or cyanosis. No edema. Neurologic: Alert and oriented x 3.  Normal finger to nose.  No drift.  Strength equal bilaterally.  EKG:  ASSESSMENT AND PLAN:

## 2011-06-21 NOTE — Assessment & Plan Note (Signed)
The cause of this seems unclear.  It is not clearly positional, but may have slight positional component.  He perhaps could have had a brainstem stroke, although there are not obvious neurologic changes.  It would be worth a non contrast CT to see what this shows.  TS

## 2011-06-21 NOTE — Assessment & Plan Note (Signed)
This seems better, but could be worse with change in Lisinopril.

## 2011-06-21 NOTE — Assessment & Plan Note (Signed)
This appears mild.  Not too dramatic, and dizziness seems to persist despite BP of 150 or greater.  Without chest pain, will stop nitrates, and increase dose of ACE to see if that helps.

## 2011-06-25 ENCOUNTER — Telehealth: Payer: Self-pay | Admitting: Cardiology

## 2011-06-25 ENCOUNTER — Encounter: Payer: Self-pay | Admitting: Neurology

## 2011-06-25 NOTE — Telephone Encounter (Addendum)
Mr Zapien calling to find out what neurologist he should see---advised we could make an appoint with dr Morton Amy neurololgy at our [place] office--pt agrees--i called dr Nash Dimmer office and made an appoint for him 07/03/11 at 10:30 am --diag:R/O TIA--NT

## 2011-06-25 NOTE — Telephone Encounter (Signed)
Pt calling wanting someone to recommended a neurologist to pt. Please return pt call to discuss further. Pt said Dr. Riley Kill called him yesterday and said Lauren would make pt appt w/ neurologist appt MD recommends.

## 2011-06-26 ENCOUNTER — Telehealth: Payer: Self-pay | Admitting: Cardiology

## 2011-06-26 NOTE — Telephone Encounter (Signed)
Pt is aware of appt dates & times with Dr. Graciela Husbands and Dr. Modesto Charon. Mylo Red RN

## 2011-06-26 NOTE — Telephone Encounter (Signed)
Pt sone was calling regarding keeping appt with Dr. Modesto Charon and Dr. Graciela Husbands

## 2011-07-01 ENCOUNTER — Encounter: Payer: Self-pay | Admitting: Internal Medicine

## 2011-07-01 ENCOUNTER — Ambulatory Visit (INDEPENDENT_AMBULATORY_CARE_PROVIDER_SITE_OTHER): Payer: Medicare Other | Admitting: Internal Medicine

## 2011-07-01 DIAGNOSIS — R42 Dizziness and giddiness: Secondary | ICD-10-CM

## 2011-07-01 DIAGNOSIS — I4891 Unspecified atrial fibrillation: Secondary | ICD-10-CM

## 2011-07-01 DIAGNOSIS — I498 Other specified cardiac arrhythmias: Secondary | ICD-10-CM

## 2011-07-01 DIAGNOSIS — I951 Orthostatic hypotension: Secondary | ICD-10-CM

## 2011-07-01 NOTE — Assessment & Plan Note (Signed)
This seems to be better,  i have suggested 6 inch  Blocks under the bed

## 2011-07-01 NOTE — Assessment & Plan Note (Signed)
dizzness in him maybe related to orthostatic intolerance or a neurotoxicity of amio  It is much better since the drugs discontinuation, but also was the discontinuing of the imdur and the patient and his son both appreciate that hte BP is notably higher since their discontinucation .  Thus the improvement may be elimiantion of the toxicity or higher systolic pressures.  I am reluctant at this point to think about mestinon for him

## 2011-07-01 NOTE — Progress Notes (Signed)
  HPI  Shawn [name] is a 77 y.o. male sden at the request of Dr Zara Chess because of dizziness in the cntext of atrial fibrillation and junctional rhythm treaated with amiodarone.  This was recently stopped because of the dizziness and gait instability and the former is much improved.  Dizziness is now primarily in the am upon arising from bed.  There are far fewer complaints according to his son  He also has had gait instability whichhe thinks is a disticnct entity, occuring a few minutes after he begins walking with a drifting to the right or the left.    Previously he was on [place] and the question had been raised as to whether it was contributing ot the junctional rhythm  H has not had recent afib  Past Medical History  Diagnosis Date  . CAD (coronary artery disease)      BYPASS GRAFT EF 40-45% (ICD-414.02)  . HTN (hypertension)   . Hypercholesteremia   . Diabetes mellitus   . Atrial fibrillation   . Tachycardia   . Sinus bradycardia   . Renal disease   . Edema   . Back pain   . Prostate cancer     Past Surgical History  Procedure Date  . Coronary artery bypass graft 1990    Current Outpatient Prescriptions  Medication Sig Dispense Refill  . aspirin 81 MG tablet Take 81 mg by mouth daily.        . finasteride (PROSCAR) 5 MG tablet Take 1 tablet by mouth Daily.      . furosemide (LASIX) 20 MG tablet take 1 tablet by mouth once daily  30 tablet  8  . glipiZIDE (GLUCOTROL) 10 MG tablet Take 10 mg by mouth daily.        Marland Kitchen KLOR-CON M10 10 MEQ tablet take 1 tablet by mouth once daily  30 tablet  2  . levothyroxine (SYNTHROID, LEVOTHROID) 50 MCG tablet Take 1 tablet (50 mcg total) by mouth daily.  30 tablet  2  . lisinopril (PRINIVIL,ZESTRIL) 10 MG tablet Take 1 tablet (10 mg total) by mouth daily.  30 tablet  6  . Multiple Vitamin (MULTIVITAMIN) tablet Take 1 tablet by mouth daily.        . nitroGLYCERIN (NITROSTAT) 0.4 MG SL tablet Place 0.4 mg under the tongue every 5 (five)  minutes as needed.        Marland Kitchen PRADAXA 150 MG CAPS take 1 capsule by mouth twice a day  60 capsule  3  . predniSONE (DELTASONE) 5 MG tablet Take one every other day      . rosuvastatin (CRESTOR) 10 MG tablet Take 10 mg by mouth daily.          No Known Allergies  Review of Systems negative except from HPI and PMH  Physical Exam Well developed and well nourished in no acute distress HENT normal hearing aid in place E scleral and icterus clear Neck Supple JVP flat; carotids brisk and full Clear to ausculation Regular rate and rhythm, no murmurs gallops or rub Soft with active bowel sounds No clubbing cyanosis and edema Alert and oriented, grossly normal motor and sensory function Skin Warm and Dry    Assessment and  Plan

## 2011-07-02 ENCOUNTER — Ambulatory Visit: Payer: Medicare Other | Admitting: Internal Medicine

## 2011-07-02 DIAGNOSIS — I498 Other specified cardiac arrhythmias: Secondary | ICD-10-CM | POA: Insufficient documentation

## 2011-07-02 NOTE — Assessment & Plan Note (Signed)
As above.

## 2011-07-02 NOTE — Assessment & Plan Note (Signed)
The patient has been maintained on amiodarone for atrial fibrillation. This has been discontinued because of neurological symptoms. The challenge will be what to do if he has recurrent symptoms given the junctional rhythm issues. It may be that rate control would be best. It may however resulted further suppression of sinus noted symptomatic junctional rhythm. AV Junction ablation could potentially be pursued however, this might result in a AV ablation proximal to the escape focus which would leave him with a junctional rhythm again. I'm hoping that we don't encountered this dilemma

## 2011-07-03 ENCOUNTER — Encounter: Payer: Self-pay | Admitting: Neurology

## 2011-07-03 ENCOUNTER — Ambulatory Visit (INDEPENDENT_AMBULATORY_CARE_PROVIDER_SITE_OTHER): Payer: Medicare Other | Admitting: Neurology

## 2011-07-03 DIAGNOSIS — R000 Tachycardia, unspecified: Secondary | ICD-10-CM

## 2011-07-03 DIAGNOSIS — R609 Edema, unspecified: Secondary | ICD-10-CM

## 2011-07-03 DIAGNOSIS — I4891 Unspecified atrial fibrillation: Secondary | ICD-10-CM

## 2011-07-03 DIAGNOSIS — R262 Difficulty in walking, not elsewhere classified: Secondary | ICD-10-CM

## 2011-07-03 DIAGNOSIS — R930 Abnormal findings on diagnostic imaging of skull and head, not elsewhere classified: Secondary | ICD-10-CM

## 2011-07-03 DIAGNOSIS — R42 Dizziness and giddiness: Secondary | ICD-10-CM

## 2011-07-03 DIAGNOSIS — R9089 Other abnormal findings on diagnostic imaging of central nervous system: Secondary | ICD-10-CM

## 2011-07-03 LAB — CBC
HCT: 37.9 — ABNORMAL LOW
MCHC: 33.8
MCV: 89.2
Platelets: 207

## 2011-07-03 LAB — BASIC METABOLIC PANEL
BUN: 13
CO2: 28
Chloride: 108
Creatinine, Ser: 1.2
Glucose, Bld: 104 — ABNORMAL HIGH
Potassium: 4

## 2011-07-03 NOTE — Patient Instructions (Signed)
Your MRI and MRA's have been scheduled for Tuesday, October 16th at 7:00pm.  Please arrive to [facility] by 6:45pm  [phone]

## 2011-07-03 NOTE — Progress Notes (Signed)
Dear Dr. Riley Kill,  Thank you for having me see Shawn [name] in consultation today at [facility] for his problem with gait instability and dizziness.  As you may recall, he is a 77 y.o. year old male with a history of CAD, diabetes, atrial fibrillation who presents with at least a year history of difficulty with his gait.  He says that he has had several falls because of his gait instability and frequently "swerves" when he walks.  He thinks this has gotten worse.  In addition, he continues to have spells of dizziness.  These are not described as the feeling of motion, but rather feeling cloudy headed.  There was a concern that this was related to his amiodarone and his Imdur so you recently stopped this.  He is uncertain whether his dizziness is better, but his son feels that he is commenting less about it.  He does endorses mainly a problem when he stands up.  He also complains of progressive short term memory problems and difficulty with sleeping.  He describes being able to bo to sleep but having difficulty staying asleep frequently getting up at 2 a.m.  He does admit to depression, as he feels that he is not able to do what he once did.  In working up his dizziness and gait instability you ordered a CT of his head.  This revealed bilateral cerebellar infarcts as well as lacunar infarcts of the right thalamus and infarcts in the right occipital lobe.  Past Medical History  Diagnosis Date  . CAD (coronary artery disease)      BYPASS GRAFT EF 40-45% (ICD-414.02)  . HTN (hypertension)   . Hypercholesteremia   . Diabetes mellitus   . Atrial fibrillation   . Tachycardia   . Sinus bradycardia   . Renal disease   . Edema   . Back pain   . Prostate cancer     Past Surgical History  Procedure Date  . Coronary artery bypass graft 1990  . Prostate surgery     History   Social History  . Marital Status: Widowed    Spouse Name: N/A    Number of Children: N/A  . Years of Education:  N/A   Social History Main Topics  . Smoking status: Former Smoker    Quit date: 09/23/1988  . Smokeless tobacco: Never Used   Comment: quit 20+ years ago  . Alcohol Use: No  . Drug Use: No  . Sexually Active: None   Other Topics Concern  . None   Social History Narrative     The patient resides in [place] alone.  He is  widowed.  He has 4 children, 9 grandchildren.   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  He denies any alcohol, drugs, or herbal medication.  He tries  to maintain a low-fat diet.  He states that he does exercise somewhat  with walking, and he uses a stationary bike for a few minutes every  other day.       Family History:  No history of gait disorders   ROS:  13 systems were reviewed and are notable for chronic lower back pain second to a herniated disk.  All other review of systems are unremarkable.   Examination:  Filed Vitals:   07/03/11 1002  BP: 144/74  Pulse: 76  Weight: 202 lb (91.627 kg)     In general, well appearing older man in  NAD.  Cardiovascular: The patient has a regular rate and rhythm and no carotid bruits.  Fundoscopy:  Disks are flat. Arterial caliber is decreased.  Mental status:   The patient is oriented to person, place and time. Recent and remote memory are intact. Attention span and concentration are normal. Language including repetition, naming, following commands are intact. Fund of knowledge of current and historical events, as well as vocabulary are normal.  Cranial Nerves: Pupils are equally round and reactive to light. Visual fields full to confrontation. Extraocular movements are intact without nystagmus.  He has hypometric saccades.  Facial sensation and muscles of mastication are intact. Muscles of facial expression are symmetric. Hearing decreased to bilateral finger rub, particularly on the right. Tongue protrusion, uvula, palate midline.  Shoulder shrug intact  Motor:  The  patient has normal bulk and tone, no pronator drift.  There are no adventitious movements. 5/5 muscle strength bilaterally.  Reflexes:   Biceps  Triceps Brachioradialis Knee Ankle  Right 3+  3+  3+   2+ 0  Left  3+  3+  3+   2+ 0  Toes down  Coordination:  Normal finger to nose.  No dysdiadokinesia.  Heal to shin slightly impaired bilaterally.  Sensation is decreased in hands and feet to temperature and vibration bilaterally.  Gait and Station are wide based.  Tandem gait was not tested.  Romberg is negative.   CT Head was reviewed and was remarkable for multiple lacunar infarctions of bilateral cerebellar hemispheres.  Also left thalamic infarct, and questionable left occipital infarct.  There is notable atrophy and extensive white matter disease.  Impression:  Certainly Shawn [name] gait instability and findings on exam could be coming from his bilateral cerebellar disease.  It is likely contributed to from his probable peripheral neuropathy on exam which is likely due to his diabetes.  I am worried about his posterior circulation so getting better imaging of this is going to be essential.  The spells of dizziness are a little harder to understand.  I think they are likely medication related, probably representative of orthostatic hypotension, as they seem to have improved since stopping the Imdur and amiodarone.  While it is possible that transient dizziness could be coming from decreased blood flow through a compromised posterior circulation I think that is less likely. I think his memory issues are likely due to his extensive white matter disease - likely representing an early form of vascular dementia.  Finally his sleep disturbance and early morning awakening is commonly seen in depression.   Recommendations:  1.  Ischemic strokes - MRA head and neck, as well as MRI brain.  I would not add any other anti-platelet agent given he is on an anti-coagulant and aspirin. 2  Dizziness spells -  I would just continue to monitor these. The MRA will help [place] determine if he has basilar insufficiency.   We can consider more extensive vestibular testing, but I think this is unlikely to bear much fruit. 3. Memory loss - MRI brain to assess hippocampi more carefully.  Will consider memory testing in the future. 4. Depression/sleep problems - would suggest Dr. Jacky Kindle try an anti-depressant and/or referral to a psychologist.  We will see the patient back in 6 weeks.  Thank you for having [place] see Shawn [name] in consultation.  Feel free to contact me with any questions.  Lupita Raider Modesto Charon, MD [facility], [place] [address] [place], [place] [address] Phone: [phone] Fax: [phone]  [phone].  

## 2011-07-04 LAB — BASIC METABOLIC PANEL
BUN: 21
BUN: 21
BUN: 24 — ABNORMAL HIGH
BUN: 25 — ABNORMAL HIGH
BUN: 26 — ABNORMAL HIGH
CO2: 26
CO2: 27
CO2: 29
CO2: 30
CO2: 33 — ABNORMAL HIGH
Calcium: 8.5
Calcium: 8.8
Calcium: 9
Chloride: 100
Chloride: 101
Chloride: 105
Chloride: 98
Creatinine, Ser: 1.34
Creatinine, Ser: 1.38
Creatinine, Ser: 1.4
Creatinine, Ser: 1.48
GFR calc Af Amer: 56 — ABNORMAL LOW
GFR calc Af Amer: >60
GFR calc Af Amer: >60
GFR calc non Af Amer: 49 — ABNORMAL LOW
GFR calc non Af Amer: 53 — ABNORMAL LOW
Glucose, Bld: 92
Potassium: 3.8
Potassium: 4.5
Sodium: 133 — ABNORMAL LOW
Sodium: 135

## 2011-07-04 LAB — URINALYSIS, ROUTINE W REFLEX MICROSCOPIC
Hgb urine dipstick: NEGATIVE
Nitrite: NEGATIVE
Protein, ur: NEGATIVE
Specific Gravity, Urine: 1.015
Urobilinogen, UA: 1

## 2011-07-04 LAB — CBC
HCT: 41.8
HCT: 37.3 — ABNORMAL LOW
MCHC: 33.4
MCHC: 34.3
MCHC: 34.8
MCV: 87.7
Platelets: 225
Platelets: 207
Platelets: 214
RBC: 4.26
RBC: 4.38
RDW: 13.5
RDW: 13.8

## 2011-07-04 LAB — COMPREHENSIVE METABOLIC PANEL
Albumin: 3.4 — ABNORMAL LOW
BUN: 15
Chloride: 104
Creatinine, Ser: 1.19
GFR calc non Af Amer: 59 — ABNORMAL LOW
Total Bilirubin: 0.9

## 2011-07-04 LAB — CARDIAC PANEL(CRET KIN+CKTOT+MB+TROPI)
CK, MB: 2.4
Troponin I: 0.06

## 2011-07-04 LAB — DIFFERENTIAL
Basophils Absolute: 0
Lymphocytes Relative: 21
Monocytes Absolute: 0.6
Neutro Abs: 6.4

## 2011-07-04 LAB — MAGNESIUM: Magnesium: 2.3

## 2011-07-04 LAB — APTT: aPTT: 27

## 2011-07-04 LAB — TROPONIN I: Troponin I: 0.2 — ABNORMAL HIGH

## 2011-07-04 LAB — CK TOTAL AND CKMB (NOT AT ARMC)
CK, MB: 2.1
CK, MB: 3.7
Relative Index: INVALID
Relative Index: INVALID

## 2011-07-04 LAB — URINE CULTURE

## 2011-07-04 LAB — HEPARIN LEVEL (UNFRACTIONATED): Heparin Unfractionated: 1.11 — ABNORMAL HIGH

## 2011-07-04 LAB — PROTIME-INR: INR: 1.1

## 2011-07-05 LAB — CBC
HCT: 39.7
HCT: 45.4
Hemoglobin: 13.6
Hemoglobin: 13.6
MCHC: 34.4
MCHC: 34.5
MCHC: 34.6
MCV: 88.6
MCV: 88.9
Platelets: 203
Platelets: 216
Platelets: 260
RBC: 4.43
RBC: 4.5
RDW: 13
RDW: 13.1
WBC: 12.8 — ABNORMAL HIGH
WBC: 14.6 — ABNORMAL HIGH

## 2011-07-05 LAB — BASIC METABOLIC PANEL
BUN: 22
BUN: 24 — ABNORMAL HIGH
BUN: 27 — ABNORMAL HIGH
CO2: 25
CO2: 28
CO2: 28
CO2: 29
Calcium: 8.1 — ABNORMAL LOW
Calcium: 8.1 — ABNORMAL LOW
Calcium: 8.2 — ABNORMAL LOW
Calcium: 8.4
Chloride: 101
Chloride: 105
Chloride: 105
Creatinine, Ser: 1.46
Creatinine, Ser: 1.52 — ABNORMAL HIGH
Creatinine, Ser: 1.57 — ABNORMAL HIGH
GFR calc Af Amer: 54 — ABNORMAL LOW
GFR calc Af Amer: 57 — ABNORMAL LOW
GFR calc Af Amer: 58 — ABNORMAL LOW
GFR calc Af Amer: 58 — ABNORMAL LOW
GFR calc non Af Amer: 43 — ABNORMAL LOW
GFR calc non Af Amer: 52 — ABNORMAL LOW
Glucose, Bld: 61 — ABNORMAL LOW
Glucose, Bld: 78
Glucose, Bld: 98
Potassium: 4.5
Potassium: 4.6
Potassium: 5.4 — ABNORMAL HIGH
Potassium: 5.5 — ABNORMAL HIGH
Sodium: 133 — ABNORMAL LOW
Sodium: 135
Sodium: 136
Sodium: 139

## 2011-07-05 LAB — I-STAT 8, (EC8 V) (CONVERTED LAB)
Acid-base deficit: 2
Hemoglobin: 15.6
Potassium: 3.9
Sodium: 135
TCO2: 19

## 2011-07-05 LAB — DIFFERENTIAL
Basophils Absolute: 0
Eosinophils Absolute: 0.6
Lymphs Abs: 8.5 — ABNORMAL HIGH
Monocytes Relative: 7

## 2011-07-05 LAB — MAGNESIUM: Magnesium: 2.1

## 2011-07-05 LAB — POCT I-STAT CREATININE
Creatinine, Ser: 1.7 — ABNORMAL HIGH
Operator id: 196461

## 2011-07-05 LAB — CARDIAC PANEL(CRET KIN+CKTOT+MB+TROPI)
Relative Index: 8.5 — ABNORMAL HIGH
Relative Index: 9.5 — ABNORMAL HIGH
Total CK: 1073 — ABNORMAL HIGH
Troponin I: >100
Troponin I: >100

## 2011-07-05 LAB — LIPID PANEL: Triglycerides: 175 — ABNORMAL HIGH

## 2011-07-05 LAB — PROTIME-INR
INR: 1.1
Prothrombin Time: 14.5

## 2011-07-05 LAB — APTT: aPTT: >200

## 2011-07-05 LAB — POCT CARDIAC MARKERS: Myoglobin, poc: 94.3

## 2011-07-05 LAB — TSH: TSH: 11.361 — ABNORMAL HIGH

## 2011-07-09 ENCOUNTER — Other Ambulatory Visit: Payer: Self-pay

## 2011-07-09 ENCOUNTER — Ambulatory Visit (HOSPITAL_COMMUNITY)
Admission: RE | Admit: 2011-07-09 | Discharge: 2011-07-09 | Disposition: A | Discharge status: Home / Self Care | Payer: Medicare Other | Source: Ambulatory Visit | Attending: Neurology | Admitting: Neurology

## 2011-07-09 DIAGNOSIS — Q283 Other malformations of cerebral vessels: Secondary | ICD-10-CM | POA: Insufficient documentation

## 2011-07-09 DIAGNOSIS — I4891 Unspecified atrial fibrillation: Secondary | ICD-10-CM

## 2011-07-09 DIAGNOSIS — R609 Edema, unspecified: Secondary | ICD-10-CM

## 2011-07-09 DIAGNOSIS — R9089 Other abnormal findings on diagnostic imaging of central nervous system: Secondary | ICD-10-CM

## 2011-07-09 DIAGNOSIS — R42 Dizziness and giddiness: Secondary | ICD-10-CM

## 2011-07-09 DIAGNOSIS — I658 Occlusion and stenosis of other precerebral arteries: Secondary | ICD-10-CM | POA: Insufficient documentation

## 2011-07-09 DIAGNOSIS — R262 Difficulty in walking, not elsewhere classified: Secondary | ICD-10-CM

## 2011-07-09 DIAGNOSIS — R000 Tachycardia, unspecified: Secondary | ICD-10-CM

## 2011-07-09 DIAGNOSIS — Z8673 Personal history of transient ischemic attack (TIA), and cerebral infarction without residual deficits: Secondary | ICD-10-CM | POA: Insufficient documentation

## 2011-07-09 DIAGNOSIS — G319 Degenerative disease of nervous system, unspecified: Secondary | ICD-10-CM | POA: Insufficient documentation

## 2011-07-09 DIAGNOSIS — I6529 Occlusion and stenosis of unspecified carotid artery: Secondary | ICD-10-CM | POA: Insufficient documentation

## 2011-07-09 DIAGNOSIS — M4802 Spinal stenosis, cervical region: Secondary | ICD-10-CM | POA: Insufficient documentation

## 2011-07-09 DIAGNOSIS — I651 Occlusion and stenosis of basilar artery: Secondary | ICD-10-CM | POA: Insufficient documentation

## 2011-07-09 MED ORDER — GADOBENATE DIMEGLUMINE 529 MG/ML IV SOLN
19.0000 mL | Freq: Once | INTRAVENOUS | Status: AC | PRN
  Administered 2011-07-09: 19 mL via INTRAVENOUS

## 2011-07-09 MED ORDER — ALPRAZOLAM 0.5 MG PO TABS: ORAL_TABLET | ORAL | Status: DC

## 2011-07-09 NOTE — Telephone Encounter (Signed)
Pt's son requesting med to calm pt down for MRI.  Per Dr. Modesto Charon ok to call in xanax 0.5mg  #2/0 One 30 prior to MRI may repeat one time if needed.  [facility] [address]

## 2011-07-12 ENCOUNTER — Encounter: Payer: Self-pay | Admitting: Cardiology

## 2011-07-12 ENCOUNTER — Ambulatory Visit (INDEPENDENT_AMBULATORY_CARE_PROVIDER_SITE_OTHER): Payer: Medicare Other | Admitting: Cardiology

## 2011-07-12 VITALS — BP 132/78 | HR 68 | Ht 71.0 in | Wt 210.0 lb

## 2011-07-12 DIAGNOSIS — I4891 Unspecified atrial fibrillation: Secondary | ICD-10-CM

## 2011-07-12 DIAGNOSIS — I2510 Atherosclerotic heart disease of native coronary artery without angina pectoris: Secondary | ICD-10-CM

## 2011-07-12 DIAGNOSIS — R42 Dizziness and giddiness: Secondary | ICD-10-CM

## 2011-07-12 DIAGNOSIS — I10 Essential (primary) hypertension: Secondary | ICD-10-CM

## 2011-07-12 MED ORDER — METOPROLOL SUCCINATE ER 25 MG PO TB24: 25.0000 mg | ORAL_TABLET | Freq: Every day | ORAL | Status: DC

## 2011-07-12 NOTE — Patient Instructions (Signed)
Your physician recommends that you schedule a follow-up appointment in: 1 MONTH  Your physician has recommended you make the following change in your medication: START Metoprolol Succinate 25mg  once a day

## 2011-07-12 NOTE — Assessment & Plan Note (Signed)
See Dr. Odessa Fleming note.  We will continue to monitor forward.  He is on Pradaxa

## 2011-07-12 NOTE — Progress Notes (Signed)
HPI:  [name] is back in the office for a follow up visit.  He overall is doing about the same.  He has seen neuro since the last visit, and they did an MRI.  This demonstrated multiple areas, including cerebellar infarct, and small punctate hemorrhagic areas.  He is stable at this point in time.  No chest pain or progressive symptoms.  His BP has been somewhat higher than optimal.  He is now off of Amiodarone.  He felt poorly on Amiodarone.    Current Outpatient Prescriptions  Medication Sig Dispense Refill  . aspirin 81 MG tablet Take 81 mg by mouth. Every other day      . finasteride (PROSCAR) 5 MG tablet Take 1 tablet by mouth Daily.      . furosemide (LASIX) 20 MG tablet take 1 tablet by mouth once daily  30 tablet  8  . glipiZIDE (GLUCOTROL) 10 MG tablet Take 10 mg by mouth daily.        Marland Kitchen KLOR-CON M10 10 MEQ tablet take 1 tablet by mouth once daily  30 tablet  2  . levothyroxine (SYNTHROID, LEVOTHROID) 50 MCG tablet Take 1 tablet (50 mcg total) by mouth daily.  30 tablet  2  . lisinopril (PRINIVIL,ZESTRIL) 10 MG tablet Take 1 tablet (10 mg total) by mouth daily.  30 tablet  6  . Multiple Vitamin (MULTIVITAMIN) tablet Take 1 tablet by mouth daily.        . nitroGLYCERIN (NITROSTAT) 0.4 MG SL tablet Place 0.4 mg under the tongue every 5 (five) minutes as needed.        Marland Kitchen PRADAXA 150 MG CAPS take 1 capsule by mouth twice a day  60 capsule  3  . predniSONE (DELTASONE) 5 MG tablet Take one every other day      . rosuvastatin (CRESTOR) 10 MG tablet Take 10 mg by mouth daily.        . metoprolol succinate (TOPROL-XL) 25 MG 24 hr tablet Take 1 tablet (25 mg total) by mouth daily.  30 tablet  11    No Known Allergies  Past Medical History  Diagnosis Date  . CAD (coronary artery disease)      BYPASS GRAFT EF 40-45% (ICD-414.02)  . HTN (hypertension)   . Hypercholesteremia   . Diabetes mellitus   . Atrial fibrillation   . Tachycardia   . Sinus bradycardia   . Renal disease   . Edema   .  Back pain   . Prostate cancer     Past Surgical History  Procedure Date  . Coronary artery bypass graft 1990  . Prostate surgery     No family history on file.  History   Social History  . Marital Status: Widowed    Spouse Name: N/A    Number of Children: N/A  . Years of Education: N/A   Occupational History  . Not on file.   Social History Main Topics  . Smoking status: Former Smoker    Quit date: 09/23/1988  . Smokeless tobacco: Never Used   Comment: quit 20+ years ago  . Alcohol Use: No  . Drug Use: No  . Sexually Active: Not on file   Other Topics Concern  . Not on file   Social History Narrative     The patient resides in [place] alone.  He is  widowed.  He has 4 children, 9 grandchildren.   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  He denies any alcohol, drugs, or herbal medication.  He tries  to maintain a low-fat diet.  He states that he does exercise somewhat  with walking, and he uses a stationary bike for a few minutes every  other day.       ROS: Please see the HPI.  All other systems reviewed and negative.  PHYSICAL EXAM:  BP 132/78  Pulse 68  Ht 5\' 11"  (1.803 m)  Wt 210 lb (95.255 kg)  BMI 29.29 kg/m2  General: Well developed, well nourished, in no acute distress. Head:  Normocephalic and atraumatic. Neck: no JVD Lungs: Clear to auscultation and percussion. Heart: Normal S1 and S2.  No murmur, rubs or gallops.  Abdomen:  Normal bowel sounds; soft; non tender; no organomegaly Pulses: Pulses normal in all 4 extremities. Extremities: No clubbing or cyanosis. No edema. Neurologic: Alert and oriented x 3.  EKG:  ASSESSMENT AND PLAN:

## 2011-07-12 NOTE — Assessment & Plan Note (Signed)
BP are up a bit, and he was on amio for atrial fib.  I think even though he had some junctional rhythm, I will add a very low dose of metoprolol, and use for both his BP as well as rate control should he go into atrial fibrillation.  I will see him back in follow up in about four weeks.

## 2011-07-12 NOTE — Assessment & Plan Note (Signed)
Possibly due to cerebellar stroke.

## 2011-07-15 ENCOUNTER — Encounter: Payer: Self-pay | Admitting: Neurology

## 2011-08-19 ENCOUNTER — Encounter: Payer: Self-pay | Admitting: Cardiology

## 2011-08-19 ENCOUNTER — Ambulatory Visit (INDEPENDENT_AMBULATORY_CARE_PROVIDER_SITE_OTHER): Payer: Medicare Other | Admitting: Cardiology

## 2011-08-19 DIAGNOSIS — R42 Dizziness and giddiness: Secondary | ICD-10-CM

## 2011-08-19 DIAGNOSIS — I4891 Unspecified atrial fibrillation: Secondary | ICD-10-CM

## 2011-08-19 DIAGNOSIS — I25810 Atherosclerosis of coronary artery bypass graft(s) without angina pectoris: Secondary | ICD-10-CM

## 2011-08-19 DIAGNOSIS — I2510 Atherosclerotic heart disease of native coronary artery without angina pectoris: Secondary | ICD-10-CM

## 2011-08-19 LAB — BASIC METABOLIC PANEL
GFR: 53.36 mL/min — ABNORMAL LOW (ref >60.00)
Glucose, Bld: 151 mg/dL — ABNORMAL HIGH (ref 70–99)
Potassium: 4.3 mEq/L (ref 3.5–5.1)
Sodium: 140 mEq/L (ref 135–145)

## 2011-08-19 NOTE — Patient Instructions (Signed)
Your physician recommends that you schedule a follow-up appointment in: 2 MONTHS  Your physician recommends that you have lab work today: BMP   Your physician recommends that you continue on your current medications as directed. Please refer to the Current Medication list given to you today.  

## 2011-08-19 NOTE — Assessment & Plan Note (Signed)
Has had cerebellar stroke.  Currently on meds.  Continue as we are doing.  Symptoms are better.

## 2011-08-19 NOTE — Assessment & Plan Note (Signed)
Off of Amiodarone without recurrence at this point in time.  Will continue to monitor closely.  He understands that this could recur.

## 2011-08-19 NOTE — Progress Notes (Signed)
HPI:  He is doing some better.  No chest pain.  Dizziness improved. Has seen neuro at this point.  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   Current Outpatient Prescriptions  Medication Sig Dispense Refill  . aspirin 81 MG tablet Take 81 mg by mouth. Every other day      . finasteride (PROSCAR) 5 MG tablet Take 1 tablet by mouth Daily.      . furosemide (LASIX) 20 MG tablet take 1 tablet by mouth once daily  30 tablet  8  . glipiZIDE (GLUCOTROL) 10 MG tablet Take 10 mg by mouth daily.        Marland Kitchen KLOR-CON M10 10 MEQ tablet take 1 tablet by mouth once daily  30 tablet  2  . levothyroxine (SYNTHROID, LEVOTHROID) 50 MCG tablet Take 75 mcg by mouth daily.        Marland Kitchen lisinopril (PRINIVIL,ZESTRIL) 10 MG tablet Take 1 tablet (10 mg total) by mouth daily.  30 tablet  6  . metoprolol succinate (TOPROL-XL) 25 MG 24 hr tablet Take 1 tablet (25 mg total) by mouth daily.  30 tablet  11  . Multiple Vitamin (MULTIVITAMIN) tablet Take 1 tablet by mouth daily.        . nitroGLYCERIN (NITROSTAT) 0.4 MG SL tablet Place 0.4 mg under the tongue every 5 (five) minutes as needed.        Marland Kitchen PRADAXA 150 MG CAPS take 1 capsule by mouth twice a day  60 capsule  3  . predniSONE (DELTASONE) 5 MG tablet Take one every other day      . rosuvastatin (CRESTOR) 10 MG tablet Take 10 mg by mouth daily.          No Known Allergies  Past Medical History  Diagnosis Date  . CAD (coronary artery disease)      BYPASS GRAFT EF 40-45% (ICD-414.02)  . HTN (hypertension)   . Hypercholesteremia   . Diabetes mellitus   . Atrial fibrillation   . Tachycardia   . Sinus bradycardia   . Renal disease   . Edema   . Back pain   . Prostate cancer     Past Surgical History  Procedure Date  . Coronary artery bypass graft 1990  . Prostate surgery     No family history on file.  History   Social History  . Marital Status: Widowed    Spouse Name: N/A    Number of Children:  N/A  . Years of Education: N/A   Occupational History  . Not on file.   Social History Main Topics  . Smoking status: Former Smoker    Quit date: 09/23/1988  . Smokeless tobacco: Never Used   Comment: quit 20+ years ago  . Alcohol Use: No  . Drug Use: No  . Sexually Active: Not on file   Other Topics Concern  . Not on file   Social History Narrative     The patient resides in [place] alone.  He is  widowed.  He has 4 children, 9 grandchildren.   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  He denies any alcohol, drugs, or herbal medication.  He tries  to maintain a low-fat diet.  He states that he does exercise somewhat  with walking, and he uses a stationary bike for a few minutes every  other day.  ROS: Please see the HPI.  All other systems reviewed and negative.  PHYSICAL EXAM:  BP 142/64  Pulse 56  Ht 5\' 11"  (1.803 m)  Wt 95.618 kg (210 lb 12.8 oz)  BMI 29.40 kg/m2  General: Well developed, well nourished, in no acute distress. Head:  Normocephalic and atraumatic. Neck: no JVD Lungs: Clear to auscultation and percussion. Heart: Normal S1 and S2. PMI non displaced.  Soft SEM.  No DM.   Abdomen:  Normal bowel sounds; soft; non tender; no organomegaly Pulses: Pulses normal in all 4 extremities. Extremities: No clubbing or cyanosis. No edema. Neurologic: Alert and oriented x 3.  EKG:  SB.  Nonspecific T wave abnormality.    ASSESSMENT AND PLAN:

## 2011-08-19 NOTE — Progress Notes (Signed)
Patient ID: Shawn [name], male   DOB: Feb 05, 1928, 77 y.o.   MRN: [id number]

## 2011-08-19 NOTE — Assessment & Plan Note (Signed)
Stable.  No recurrent symptoms.

## 2011-08-20 ENCOUNTER — Other Ambulatory Visit: Payer: Self-pay | Admitting: Cardiology

## 2011-08-22 NOTE — Progress Notes (Signed)
Addended by: Brien Mates R on: 08/22/2011 12:39 PM   Modules accepted: Orders

## 2011-09-03 ENCOUNTER — Encounter: Payer: Self-pay | Admitting: Neurology

## 2011-09-03 ENCOUNTER — Ambulatory Visit (INDEPENDENT_AMBULATORY_CARE_PROVIDER_SITE_OTHER): Payer: Medicare Other | Admitting: Neurology

## 2011-09-03 DIAGNOSIS — R413 Other amnesia: Secondary | ICD-10-CM

## 2011-09-03 NOTE — Patient Instructions (Signed)
We will call you with the appointment regarding your memory testing as soon as we get that set up at [facility] located at [address] in [place]. [phone].  We will also send you a letter when it's time to call and schedule a follow up appointment with Dr. Modesto Charon.

## 2011-09-03 NOTE — Progress Notes (Signed)
Dear Dr. Jacky Kindle,  I saw  Shawn [name] back in [facility] for his problem with memory, gait instability and dizziness.  As you may recall, he is a 77 y.o. year old male with a history of diabetes, hypertension, HLD, atrial fibrillation who has had chronic problems with walking.    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  I also felt that depression was playing a significant role in his cognitive function. It sounds like you recently started in on an anti-depressant.  He feels the dizziness is somewhat better. He is described this as "cloudiness" in the past.  He still continues to have problems with memory. 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 He still goes to work every day from 9-5. 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 He does not have any hobbies and spends most of his time watching TV. He used to like to work in the shop but does not feel like doing this anymore.  He still seems to be somewhat unsteady on his feet and I felt this was secondary to his old cerebellar strokes. He is not had any falls.    Medical history, social history, and family history were reviewed and have not changed since the last clinic visit.  Current Outpatient Prescriptions on File Prior to Visit  Medication Sig Dispense Refill  . aspirin 81 MG tablet Take 81 mg by mouth. Every other day      . finasteride (PROSCAR) 5 MG tablet Take 1 tablet by mouth Daily.      . furosemide (LASIX) 20 MG tablet take 1 tablet by mouth once daily  30 tablet  8  . glipiZIDE (GLUCOTROL) 10 MG tablet Take 10 mg by mouth daily.        Marland Kitchen KLOR-CON M10 10 MEQ tablet take 1 tablet by mouth once daily  30 tablet  2  . levothyroxine (SYNTHROID,  LEVOTHROID) 50 MCG tablet Take 75 mcg by mouth daily.        Marland Kitchen lisinopril (PRINIVIL,ZESTRIL) 10 MG tablet Take 1 tablet (10 mg total) by mouth daily.  30 tablet  6  . metoprolol succinate (TOPROL-XL) 25 MG 24 hr tablet Take 1 tablet (25 mg total) by mouth daily.  30 tablet  11  . Multiple Vitamin (MULTIVITAMIN) tablet Take 1 tablet by mouth daily.        Marland Kitchen NITROSTAT 0.4 MG SL tablet PLACE 1 TABLET UNDER THE TONGUE AS DIRECTED IF NEEDED EVERY 5 MINUTES UP TO 3 DOSES IF NEEDED FOR CHEST PAIN  25 tablet  1  . PRADAXA 150 MG CAPS take 1 capsule by mouth twice a day  60 capsule  3  . predniSONE (DELTASONE) 5 MG tablet Take one every other day      . rosuvastatin (CRESTOR) 10 MG tablet Take 10 mg by mouth daily.        - unknown antidepressant  No Known Allergies  ROS:  13 systems were reviewed and are notable for chronic back pain.  All other review of systems are unremarkable.  Exam: . Filed Vitals:   09/03/11 1024  BP: 148/68  Pulse: 60  Weight: 213 lb (96.616 kg)  In general, well appearing older man.  Gait:  Mildly wide based gait, somewhat unsteady.  Negative romberg, but feels "dizzy".  Impression/Recs: 1.  Cerebellar Strokes - has atherosclerosis, but no clear intervenable lesion.  Continue coumadin and aspirin. 2.  Memory disorder - I think a large part of this is secondary to depression.  However, he also has diffuse microvascular changes and atrophy that may be contributing.  I am going to send him for memory testing to try to determine how much may be vascular vs. affective.  Will consider donepezil depending on results. 3.  Dizziness - seems slightly better.  No clear vestibular dysfunction, not clearly from basilar disease.  Will just monitor.  We will see the patient back in 3 months.  Lupita Raider Modesto Charon, MD [facility], [place]

## 2011-10-06 ENCOUNTER — Other Ambulatory Visit: Payer: Self-pay | Admitting: Cardiology

## 2011-10-14 NOTE — Progress Notes (Signed)
NP Testing Results 09/30/2011.  Diagnoses: Dementia NOS, Depressive Disorder NOS, Anxiety disorder NOS.  However, did not feel the affective disorders are driving the memory problems.  Will benefit from trial of memory med.

## 2011-10-15 ENCOUNTER — Telehealth: Payer: Self-pay | Admitting: Neurology

## 2011-10-15 NOTE — Telephone Encounter (Signed)
Message copied by Argie Ramming on Tue Oct 15, 2011 10:54 AM ------      Message from: Denton Meek H      Created: Mon Oct 14, 2011 10:40 AM       Hi Misty Stanley,            Could you book a f/u appt with Mr. Rudman in the next couple of weeks to discuss the results of his neuropsych testing?            Thanks,            [employer]

## 2011-10-15 NOTE — Telephone Encounter (Signed)
LM with patient to call office and schedule a fu appt in order to go over the results of his neuropsych testing.

## 2011-10-21 ENCOUNTER — Encounter: Payer: Self-pay | Admitting: Cardiology

## 2011-10-21 ENCOUNTER — Ambulatory Visit (INDEPENDENT_AMBULATORY_CARE_PROVIDER_SITE_OTHER): Payer: Medicare Other | Admitting: Cardiology

## 2011-10-21 DIAGNOSIS — E7800 Pure hypercholesterolemia, unspecified: Secondary | ICD-10-CM

## 2011-10-21 DIAGNOSIS — N182 Chronic kidney disease, stage 2 (mild): Secondary | ICD-10-CM

## 2011-10-21 DIAGNOSIS — I4891 Unspecified atrial fibrillation: Secondary | ICD-10-CM

## 2011-10-21 DIAGNOSIS — I25810 Atherosclerosis of coronary artery bypass graft(s) without angina pectoris: Secondary | ICD-10-CM

## 2011-10-21 DIAGNOSIS — I10 Essential (primary) hypertension: Secondary | ICD-10-CM

## 2011-10-21 LAB — CBC WITH DIFFERENTIAL/PLATELET
Basophils Relative: 0.3 % (ref 0.0–3.0)
Eosinophils Absolute: 0.3 10*3/uL (ref 0.0–0.7)
Eosinophils Relative: 3.4 % (ref 0.0–5.0)
HCT: 37.4 % — ABNORMAL LOW (ref 39.0–52.0)
Hemoglobin: 13 g/dL (ref 13.0–17.0)
Lymphs Abs: 2.9 10*3/uL (ref 0.7–4.0)
MCHC: 34.7 g/dL (ref 30.0–36.0)
MCV: 89.9 fl (ref 78.0–100.0)
Monocytes Absolute: 0.7 10*3/uL (ref 0.1–1.0)
Neutro Abs: 6.1 10*3/uL (ref 1.4–7.7)
Neutrophils Relative %: 60.4 % (ref 43.0–77.0)
RBC: 4.16 Mil/uL — ABNORMAL LOW (ref 4.22–5.81)
WBC: 10.1 10*3/uL (ref 4.5–10.5)

## 2011-10-21 LAB — BASIC METABOLIC PANEL
CO2: 31 mEq/L (ref 19–32)
Chloride: 106 mEq/L (ref 96–112)
Creatinine, Ser: 1.3 mg/dL (ref 0.4–1.5)
Potassium: 4.8 mEq/L (ref 3.5–5.1)

## 2011-10-21 NOTE — Patient Instructions (Signed)
Your physician recommends that you have lab work today: BMP and CBC  Your physician recommends that you schedule a follow-up appointment in: 6 WEEKS  Your physician recommends that you continue on your current medications as directed. Please refer to the Current Medication list given to you today.     

## 2011-10-21 NOTE — Progress Notes (Signed)
Patient ID: Shawn [name], male   DOB: 09/12/1928, 78 y.o.   MRN: [id number]  

## 2011-10-21 NOTE — Assessment & Plan Note (Signed)
Currently in NSR.  On Pradaxa.  Will continue to monitor.  Check CBC today.

## 2011-10-21 NOTE — Assessment & Plan Note (Signed)
On low dose Crestor numbers have been excellent.

## 2011-10-21 NOTE — Progress Notes (Signed)
HPI:  He is doing the best he has done in some time.  He denies chest pain.  His dizziness has improved.  He was sent for neurocognitive evaluation, and evaluation of early dementia.  Findings are included in the report, which will be uploaded to [facility].  No current shortness of breath or chest pain.  Was in [place] all weekend at a show, and has done well.   Current Outpatient Prescriptions  Medication Sig Dispense Refill  . aspirin 81 MG tablet Take 81 mg by mouth. Every other day      . CRESTOR 10 MG tablet take 1 tablet by mouth once daily  90 tablet  3  . finasteride (PROSCAR) 5 MG tablet Take 1 tablet by mouth Daily.      . furosemide (LASIX) 20 MG tablet take 1 tablet by mouth once daily  30 tablet  8  . glipiZIDE (GLUCOTROL) 10 MG tablet Take 10 mg by mouth daily.        Marland Kitchen KLOR-CON M10 10 MEQ tablet take 1 tablet by mouth once daily  30 tablet  2  . levothyroxine (SYNTHROID, LEVOTHROID) 50 MCG tablet Take 75 mcg by mouth daily.        Marland Kitchen lisinopril (PRINIVIL,ZESTRIL) 10 MG tablet Take 1 tablet (10 mg total) by mouth daily.  30 tablet  6  . metoprolol succinate (TOPROL-XL) 25 MG 24 hr tablet Take 1 tablet (25 mg total) by mouth daily.  30 tablet  11  . Multiple Vitamin (MULTIVITAMIN) tablet Take 1 tablet by mouth daily.        Marland Kitchen NITROSTAT 0.4 MG SL tablet PLACE 1 TABLET UNDER THE TONGUE AS DIRECTED IF NEEDED EVERY 5 MINUTES UP TO 3 DOSES IF NEEDED FOR CHEST PAIN  25 tablet  1  . PRADAXA 150 MG CAPS take 1 capsule by mouth twice a day  60 capsule  3  . predniSONE (DELTASONE) 5 MG tablet Take one every other day        No Known Allergies  Past Medical History  Diagnosis Date  . CAD (coronary artery disease)      BYPASS GRAFT EF 40-45% (ICD-414.02)  . HTN (hypertension)   . Hypercholesteremia   . Diabetes mellitus   . Atrial fibrillation   . Tachycardia   . Sinus bradycardia   . Renal disease   . Edema   . Back pain   . Prostate cancer     Past Surgical History  Procedure  Date  . Coronary artery bypass graft 1990  . Prostate surgery     No family history on file.  History   Social History  . Marital Status: Widowed    Spouse Name: N/A    Number of Children: N/A  . Years of Education: N/A   Occupational History  . Not on file.   Social History Main Topics  . Smoking status: Former Smoker    Quit date: 09/23/1988  . Smokeless tobacco: Never Used   Comment: quit 20+ years ago  . Alcohol Use: No  . Drug Use: No  . Sexually Active: Not on file   Other Topics Concern  . Not on file   Social History Narrative     The patient resides in [place] alone.  He is  widowed.  He has 4 children, 9 grandchildren.   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  He denies any alcohol, drugs, or  herbal medication.  He tries  to maintain a low-fat diet.  He states that he does exercise somewhat  with walking, and he uses a stationary bike for a few minutes every  other day.       ROS: Please see the HPI.  All other systems reviewed and negative.  PHYSICAL EXAM:  BP 128/62  Pulse 54  Ht 5\' 9"  (1.753 m)  Wt 98.485 kg (217 lb 1.9 oz)  BMI 32.06 kg/m2  General: Well developed, well nourished, in no acute distress. Head:  Normocephalic and atraumatic. Neck: no JVD Lungs: Clear to auscultation and percussion. Heart: Normal S1 and S2.  Minimal SEM.  No DM Abdomen:  Normal bowel sounds; soft; non tender; no organomegaly Pulses: Pulses normal in all 4 extremities. Extremities: No clubbing or cyanosis. No edema.  Some bruising on arm. Neurologic: Alert and oriented x 3.  EKG:  SB.  Probably old inferior MI.   ASSESSMENT AND PLAN:

## 2011-10-21 NOTE — Assessment & Plan Note (Signed)
Nicely controlled.   

## 2011-10-21 NOTE — Assessment & Plan Note (Signed)
Recheck BMET today to monitor.  Will continue to follow him closely.

## 2011-10-21 NOTE — Assessment & Plan Note (Signed)
No recurrent chest pain.  Monitor

## 2011-10-23 ENCOUNTER — Ambulatory Visit: Payer: Medicare Other | Admitting: Cardiology

## 2011-10-24 NOTE — Telephone Encounter (Signed)
Pt scheduled for fu appt on 11/20/2011.

## 2011-11-20 ENCOUNTER — Ambulatory Visit (INDEPENDENT_AMBULATORY_CARE_PROVIDER_SITE_OTHER): Payer: Medicare Other | Admitting: Neurology

## 2011-11-20 ENCOUNTER — Encounter: Payer: Self-pay | Admitting: Neurology

## 2011-11-20 VITALS — BP 162/70 | HR 76 | Wt 216.0 lb

## 2011-11-20 DIAGNOSIS — G309 Alzheimer's disease, unspecified: Secondary | ICD-10-CM

## 2011-11-20 DIAGNOSIS — F068 Other specified mental disorders due to known physiological condition: Secondary | ICD-10-CM

## 2011-11-20 DIAGNOSIS — F0280 Dementia in other diseases classified elsewhere without behavioral disturbance: Secondary | ICD-10-CM

## 2011-11-20 MED ORDER — DONEPEZIL HCL 10 MG PO TABS: ORAL_TABLET | ORAL | Status: DC

## 2011-11-20 MED ORDER — DONEPEZIL HCL 5 MG PO TABS: ORAL_TABLET | ORAL | Status: DC

## 2011-11-20 NOTE — Progress Notes (Signed)
Dear Dr. Jacky Kindle,  I saw  Shawn [name] back in [facility] for his problem with dementia, likely Alzheimer's type.  As you may recall, he is a 78 y.o. year old male with a history of atrial fib, depression, memory loss and gait instability(secondary to cerebellar infarctions).  He is on maximal medical therapy for his strokes currently.  At his last visit I sent him to get an NP test.  This revealed findings consistent with dementia likely of Alzheimer's type, but possibly mixed.  The patient was started recently on the exelon patch by yourself.  He has noticed no changes.  He was also recently started on citalopram but has not noticed a significant change in his mood.  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  They are hopeful he will stabilize as business slows.  He has had no agitation, hallucinations, or sundowning.    Medical history, social history, and family history were reviewed and have not changed since the last clinic visit.  Current Outpatient Prescriptions on File Prior to Visit  Medication Sig Dispense Refill  . aspirin 81 MG tablet Take 81 mg by mouth. Every other day      . CRESTOR 10 MG tablet take 1 tablet by mouth once daily  90 tablet  3  . finasteride (PROSCAR) 5 MG tablet Take 1 tablet by mouth Daily.      . furosemide (LASIX) 20 MG tablet take 1 tablet by mouth once daily  30 tablet  8  . glipiZIDE (GLUCOTROL) 10 MG tablet Take 10 mg by mouth daily.        Marland Kitchen KLOR-CON M10 10 MEQ tablet take 1 tablet by mouth once daily  30 tablet  2  . levothyroxine (SYNTHROID, LEVOTHROID) 50 MCG tablet Take 75 mcg by mouth daily.        Marland Kitchen lisinopril (PRINIVIL,ZESTRIL) 10 MG tablet Take 1 tablet (10 mg total) by mouth daily.  30 tablet  6  . metoprolol succinate (TOPROL-XL) 25 MG  24 hr tablet Take 1 tablet (25 mg total) by mouth daily.  30 tablet  11  . Multiple Vitamin (MULTIVITAMIN) tablet Take 1 tablet by mouth daily.        Marland Kitchen NITROSTAT 0.4 MG SL tablet PLACE 1 TABLET UNDER THE TONGUE AS DIRECTED IF NEEDED EVERY 5 MINUTES UP TO 3 DOSES IF NEEDED FOR CHEST PAIN  25 tablet  1  . PRADAXA 150 MG CAPS take 1 capsule by mouth twice a day  60 capsule  3  . predniSONE (DELTASONE) 5 MG tablet Take one every other day        No Known Allergies  ROS:  13 systems were reviewed and  are unremarkable.  Exam: . Filed Vitals:   11/20/11 1119  BP: 162/70  Pulse: 76  Weight: 216 lb (97.977 kg)    Impression/Recommendations:  1.  Cerebellar strokes - on maximal medical therapy.  No change at this time. 2.  Memory loss - I am going to switch him to Aricept from Exelon patch due to expense and his preference to take medications. 3.  Depression - he will continue on citalopram as per your instructions.  We will see the patient back in 3 months.  Lupita Raider Modesto Charon, MD [facility], [place]

## 2011-11-22 DIAGNOSIS — G309 Alzheimer's disease, unspecified: Secondary | ICD-10-CM | POA: Insufficient documentation

## 2011-11-28 ENCOUNTER — Ambulatory Visit (INDEPENDENT_AMBULATORY_CARE_PROVIDER_SITE_OTHER): Payer: Medicare Other | Admitting: Cardiology

## 2011-11-28 ENCOUNTER — Encounter: Payer: Self-pay | Admitting: Cardiology

## 2011-11-28 VITALS — BP 140/70 | HR 61 | Ht 71.0 in | Wt 247.8 lb

## 2011-11-28 DIAGNOSIS — I25810 Atherosclerosis of coronary artery bypass graft(s) without angina pectoris: Secondary | ICD-10-CM

## 2011-11-28 DIAGNOSIS — I4891 Unspecified atrial fibrillation: Secondary | ICD-10-CM

## 2011-11-28 DIAGNOSIS — I10 Essential (primary) hypertension: Secondary | ICD-10-CM

## 2011-11-28 NOTE — Patient Instructions (Signed)
Your physician recommends that you schedule a follow-up appointment in: 2 MONTHS  Your physician recommends that you continue on your current medications as directed. Please refer to the Current Medication list given to you today.   

## 2011-11-28 NOTE — Assessment & Plan Note (Signed)
Controlled.  Recheck labs when seen in two months.

## 2011-11-28 NOTE — Assessment & Plan Note (Signed)
No current chest pain

## 2011-11-28 NOTE — Progress Notes (Signed)
HPI:  He really is doing the best he has done in quite some time.  Denies any chest pain.  Feels good.  Dizziness has improved.    Current Outpatient Prescriptions  Medication Sig Dispense Refill  . aspirin 81 MG tablet Take 81 mg by mouth. Every other day      . citalopram (CELEXA) 20 MG tablet Take 20 mg by mouth daily.       . CRESTOR 10 MG tablet take 1 tablet by mouth once daily  90 tablet  3  . donepezil (ARICEPT) 10 MG tablet take 1 tablet at day, start after taking 5mg  tabs daily for 30 days.  30 tablet  3  . finasteride (PROSCAR) 5 MG tablet Take 1 tablet by mouth Daily.      . furosemide (LASIX) 20 MG tablet take 1 tablet by mouth once daily  30 tablet  8  . glipiZIDE (GLUCOTROL) 10 MG tablet Take 10 mg by mouth daily.        Marland Kitchen KLOR-CON M10 10 MEQ tablet take 1 tablet by mouth once daily  30 tablet  2  . levothyroxine (SYNTHROID, LEVOTHROID) 50 MCG tablet Take 75 mcg by mouth daily.        Marland Kitchen lisinopril (PRINIVIL,ZESTRIL) 10 MG tablet Take 1 tablet (10 mg total) by mouth daily.  30 tablet  6  . metoprolol succinate (TOPROL-XL) 25 MG 24 hr tablet Take 1 tablet (25 mg total) by mouth daily.  30 tablet  11  . Multiple Vitamin (MULTIVITAMIN) tablet Take 1 tablet by mouth daily.        Marland Kitchen NITROSTAT 0.4 MG SL tablet PLACE 1 TABLET UNDER THE TONGUE AS DIRECTED IF NEEDED EVERY 5 MINUTES UP TO 3 DOSES IF NEEDED FOR CHEST PAIN  25 tablet  1  . PRADAXA 150 MG CAPS take 1 capsule by mouth twice a day  60 capsule  3  . predniSONE (DELTASONE) 5 MG tablet Take one every other day        No Known Allergies  Past Medical History  Diagnosis Date  . CAD (coronary artery disease)      BYPASS GRAFT EF 40-45% (ICD-414.02)  . HTN (hypertension)   . Hypercholesteremia   . Diabetes mellitus   . Atrial fibrillation   . Tachycardia   . Sinus bradycardia   . Renal disease   . Edema   . Back pain   . Prostate cancer     Past Surgical History  Procedure Date  . Coronary artery bypass graft  1990  . Prostate surgery     No family history on file.  History   Social History  . Marital Status: Widowed    Spouse Name: N/A    Number of Children: N/A  . Years of Education: N/A   Occupational History  . Not on file.   Social History Main Topics  . Smoking status: Former Smoker    Quit date: 09/23/1988  . Smokeless tobacco: Never Used   Comment: quit 20+ years ago  . Alcohol Use: No  . Drug Use: No  . Sexually Active: Not on file   Other Topics Concern  . Not on file   Social History Narrative     The patient resides in [place] alone.  He is  widowed.  He has 4 children, 9 grandchildren.   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  He denies any alcohol,  drugs, or herbal medication.  He tries  to maintain a low-fat diet.  He states that he does exercise somewhat  with walking, and he uses a stationary bike for a few minutes every  other day.       ROS: Please see the HPI.  All other systems reviewed and negative.  PHYSICAL EXAM:  BP 140/70  Pulse 61  Ht 5\' 11"  (1.803 m)  Wt 247 lb 12.8 oz (112.401 kg)  BMI 34.56 kg/m2  General: Well developed, well nourished, in no acute distress. Head:  Normocephalic and atraumatic. Neck: no JVD Lungs: Clear to auscultation and percussion. Heart: Normal S1 and S2.  No murmur, rubs or gallops.  Abdomen:  Normal bowel sounds; soft; non tender; no organomegaly Pulses: Pulses normal in all 4 extremities. Extremities: No clubbing or cyanosis. No edema. Neurologic: Alert and oriented x 3.  EKG:  NSR.  WNL.  ASSESSMENT AND PLAN:

## 2011-11-28 NOTE — Assessment & Plan Note (Signed)
Appears to be maintaining NSR.  Tolerating things well at this point.  Remains on pradaxa.

## 2011-12-18 ENCOUNTER — Encounter: Payer: Self-pay | Admitting: Gastroenterology

## 2011-12-18 ENCOUNTER — Other Ambulatory Visit: Payer: Self-pay | Admitting: Cardiology

## 2011-12-24 ENCOUNTER — Other Ambulatory Visit: Payer: Self-pay

## 2011-12-24 MED ORDER — DONEPEZIL HCL 10 MG PO TABS: ORAL_TABLET | ORAL | Status: DC

## 2012-01-28 ENCOUNTER — Encounter: Payer: Self-pay | Admitting: Cardiology

## 2012-01-28 ENCOUNTER — Ambulatory Visit (INDEPENDENT_AMBULATORY_CARE_PROVIDER_SITE_OTHER): Payer: Medicare Other | Admitting: Cardiology

## 2012-01-28 VITALS — BP 148/76 | HR 61 | Ht 71.0 in | Wt 218.0 lb

## 2012-01-28 DIAGNOSIS — E7800 Pure hypercholesterolemia, unspecified: Secondary | ICD-10-CM

## 2012-01-28 DIAGNOSIS — I4891 Unspecified atrial fibrillation: Secondary | ICD-10-CM

## 2012-01-28 DIAGNOSIS — I25810 Atherosclerosis of coronary artery bypass graft(s) without angina pectoris: Secondary | ICD-10-CM

## 2012-01-28 DIAGNOSIS — I10 Essential (primary) hypertension: Secondary | ICD-10-CM

## 2012-01-28 NOTE — Assessment & Plan Note (Signed)
Really no angina at the present time.

## 2012-01-28 NOTE — Progress Notes (Signed)
HPI:  The patient returns for followup visit. His dizziness is much improved. He continues to get physical therapy at the building next door.   He is feeling the best he has in quite a while. He denies any ongoing angina at present time.  Current Outpatient Prescriptions  Medication Sig Dispense Refill  . aspirin 81 MG tablet Take 81 mg by mouth. Every other day      . citalopram (CELEXA) 20 MG tablet Take 20 mg by mouth daily.       . CRESTOR 10 MG tablet take 1 tablet by mouth once daily  90 tablet  3  . donepezil (ARICEPT) 10 MG tablet take 1 tablet at day, start after taking 5mg  tabs daily for 30 days.  30 tablet  6  . finasteride (PROSCAR) 5 MG tablet Take 1 tablet by mouth Daily.      . furosemide (LASIX) 20 MG tablet take 1 tablet by mouth once daily  30 tablet  8  . glipiZIDE (GLUCOTROL) 10 MG tablet Take 10 mg by mouth daily.        Marland Kitchen KLOR-CON M10 10 MEQ tablet take 1 tablet by mouth once daily  30 tablet  2  . levothyroxine (SYNTHROID, LEVOTHROID) 50 MCG tablet Take 75 mcg by mouth daily.        Marland Kitchen lisinopril (PRINIVIL,ZESTRIL) 10 MG tablet Take 1 tablet (10 mg total) by mouth daily.  30 tablet  6  . metoprolol succinate (TOPROL-XL) 25 MG 24 hr tablet Take 1 tablet (25 mg total) by mouth daily.  30 tablet  11  . Multiple Vitamin (MULTIVITAMIN) tablet Take 1 tablet by mouth daily.        Marland Kitchen NITROSTAT 0.4 MG SL tablet PLACE 1 TABLET UNDER THE TONGUE AS DIRECTED IF NEEDED EVERY 5 MINUTES UP TO 3 DOSES IF NEEDED FOR CHEST PAIN  25 tablet  1  . PRADAXA 150 MG CAPS take 1 capsule by mouth twice a day  60 capsule  3  . predniSONE (DELTASONE) 5 MG tablet Take one every other day      . DISCONTD: rivastigmine (EXELON) 4.6 mg/24hr Place 1 patch onto the skin daily.        No Known Allergies  Past Medical History  Diagnosis Date  . CAD (coronary artery disease)      BYPASS GRAFT EF 40-45% (ICD-414.02)  . HTN (hypertension)   . Hypercholesteremia   . Diabetes mellitus   . Atrial  fibrillation   . Tachycardia   . Sinus bradycardia   . Renal disease   . Edema   . Back pain   . Prostate cancer     Past Surgical History  Procedure Date  . Coronary artery bypass graft 1990  . Prostate surgery     No family history on file.  History   Social History  . Marital Status: Widowed    Spouse Name: N/A    Number of Children: N/A  . Years of Education: N/A   Occupational History  . Not on file.   Social History Main Topics  . Smoking status: Former Smoker    Quit date: 09/23/1988  . Smokeless tobacco: Never Used   Comment: quit 20+ years ago  . Alcohol Use: No  . Drug Use: No  . Sexually Active: Not on file   Other Topics Concern  . Not on file   Social History Narrative     The patient resides in [place] alone.  He is  widowed.  He has 4 children, 9 grandchildren.   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  He denies any alcohol, drugs, or herbal medication.  He tries  to maintain a low-fat diet.  He states that he does exercise somewhat  with walking, and he uses a stationary bike for a few minutes every  other day.       ROS: Please see the HPI.  All other systems reviewed and negative.  PHYSICAL EXAM:  BP 148/76  Pulse 61  Ht 5\' 11"  (1.803 m)  Wt 218 lb (98.884 kg)  BMI 30.40 kg/m2  General: Well developed, well nourished, in no acute distress. Head:  Normocephalic and atraumatic. Neck: no JVD Lungs: Clear to auscultation and percussion. Heart: Normal S1 and S2.  1-2/6 SEM.  No DM.   Abdomen:  Normal bowel sounds; soft; non tender; no organomegaly Pulses: Pulses normal in all 4 extremities. Extremities: No clubbing or cyanosis. No edema. Neurologic: Alert and oriented x 3.  EKG:  NSR.  Nonspecific ST and T wave changes.    ASSESSMENT AND PLAN:

## 2012-01-28 NOTE — Patient Instructions (Signed)
Your physician recommends that you schedule a follow-up appointment in: 4 MONTHS with Dr Riley Kill  Your physician recommends that you have lab work today: [facility]  Your physician recommends that you continue on your current medications as directed. Please refer to the Current Medication list given to you today.

## 2012-01-28 NOTE — Assessment & Plan Note (Signed)
Reasonable control. 

## 2012-01-28 NOTE — Assessment & Plan Note (Signed)
On lipid lowering therapy.   

## 2012-01-28 NOTE — Assessment & Plan Note (Signed)
No clinical recurrence at this point in time.

## 2012-01-29 LAB — BASIC METABOLIC PANEL
BUN: 18 mg/dL (ref 6–23)
Creatinine, Ser: 1.4 mg/dL (ref 0.4–1.5)
GFR: 53.76 mL/min — ABNORMAL LOW (ref >60.00)
Glucose, Bld: 140 mg/dL — ABNORMAL HIGH (ref 70–99)

## 2012-02-12 ENCOUNTER — Telehealth: Payer: Self-pay | Admitting: Neurology

## 2012-02-12 NOTE — Telephone Encounter (Signed)
Pt's son is concerned that father's condition may be psychiatric. He is asking if we can refer pt to behavioral health and also if there is anyone who specializes in geriatrics. Please call Les back on cell phone # listed below.

## 2012-02-12 NOTE — Telephone Encounter (Signed)
Called and spoke with Council Mechanic, the patient's son. He feels his dad had become more depressed and was asking for a psych referral for his father; someone that specialized in geriatrics. I mentioned that he had a follow up appointment with Dr. Modesto Charon next Wednesday and he was not aware. He states he will bring his dad to the appointment and discuss this issue with Dr. Modesto Charon at that time. No other concerns voiced.

## 2012-02-19 ENCOUNTER — Encounter: Payer: Self-pay | Admitting: Neurology

## 2012-02-19 ENCOUNTER — Ambulatory Visit (INDEPENDENT_AMBULATORY_CARE_PROVIDER_SITE_OTHER): Payer: Medicare Other | Admitting: Neurology

## 2012-02-19 VITALS — BP 120/70 | HR 80 | Ht 69.0 in | Wt 213.0 lb

## 2012-02-19 DIAGNOSIS — F3289 Other specified depressive episodes: Secondary | ICD-10-CM

## 2012-02-19 DIAGNOSIS — F329 Major depressive disorder, single episode, unspecified: Secondary | ICD-10-CM

## 2012-02-19 NOTE — Progress Notes (Signed)
Dear Dr. Jacky Kindle,  I saw  Shawn [name] back in [facility] for his problem with dementia likely Alzheimer's type complicated by depression.  As you may recall, he is a 78 y.o. 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  At his last visit I switched him to Aricept from the Exelon patch because of preference.  He thinks that his memory is slightly better but has a host of other complaints.  These include decreased appetite, fatigue, and difficulty staying asleep.  He does get nausea at times.  he continues to struggle with decreased mood because of his decreased role in the family company.   He has not had any events suspicious for strokes.  Medical history, social history, and family history were reviewed and have not changed since the last clinic visit.  Current Outpatient Prescriptions on File Prior to Visit  Medication Sig Dispense Refill  . aspirin 81 MG tablet Take 81 mg by mouth. Every other day      . citalopram (CELEXA) 20 MG tablet Take 20 mg by mouth daily.       . CRESTOR 10 MG tablet take 1 tablet by mouth once daily  90 tablet  3  . donepezil (ARICEPT) 10 MG tablet take 1 tablet at day, start after taking 5mg  tabs daily for 30 days.  30 tablet  6  . finasteride (PROSCAR) 5 MG tablet Take 1 tablet by mouth Daily.      . furosemide (LASIX) 20 MG tablet take 1 tablet by mouth once daily  30 tablet  8  . glipiZIDE (GLUCOTROL) 10 MG tablet Take 10 mg by mouth daily.        Marland Kitchen KLOR-CON M10 10 MEQ tablet take 1 tablet by mouth once daily  30 tablet  2  . levothyroxine (SYNTHROID, LEVOTHROID) 50 MCG tablet Take 75 mcg by mouth daily.        Marland Kitchen lisinopril (PRINIVIL,ZESTRIL) 10 MG tablet Take 1 tablet (10 mg total) by mouth daily.  30 tablet  6  . metoprolol succinate (TOPROL-XL) 25 MG 24 hr tablet Take 1 tablet (25 mg total) by mouth daily.  30 tablet  11  . Multiple Vitamin (MULTIVITAMIN) tablet Take 1 tablet by  mouth daily.        Marland Kitchen NITROSTAT 0.4 MG SL tablet PLACE 1 TABLET UNDER THE TONGUE AS DIRECTED IF NEEDED EVERY 5 MINUTES UP TO 3 DOSES IF NEEDED FOR CHEST PAIN  25 tablet  1  . PRADAXA 150 MG CAPS take 1 capsule by mouth twice a day  60 capsule  3  . predniSONE (DELTASONE) 5 MG tablet Take one every other day      . DISCONTD: rivastigmine (EXELON) 4.6 mg/24hr Place 1 patch onto the skin daily.        No Known Allergies  ROS:  13 systems were reviewed and are notable for difficulty with gait.  All other review of systems are unremarkable.  Exam: . Filed Vitals:   02/19/12 1052  BP: 120/70  Pulse: 80  Height: 5\' 9"  (1.753 m)  Weight: 213 lb (96.616 kg)      Mental status:   Time 5/5; place 5/5; 3 word recall 3/3 WORLD 3/5.   Impression/Recommendations:  1.  Memory disorder - I am concerned that perhaps the donepezil is causing his lack of appetite.  I have asked him to stop it for 4 weeks to see if it makes a  difference.  If it does I may switch him back to the Exelon patch. 2.  Depression - All his symptoms could be from depression.  I think he could benefit from counselling and have referred him to Dr. Karl Ito.  You may want to consider increasing his Celexa dose. 3.  Gait disorder - stable.  We will see the patient back in 3 months.  Lupita Raider Modesto Charon, MD [facility], [place]

## 2012-02-19 NOTE — Patient Instructions (Signed)
Dr. Dawayne Cirri office will call you directly to schedule your appointment.

## 2012-02-24 ENCOUNTER — Ambulatory Visit (INDEPENDENT_AMBULATORY_CARE_PROVIDER_SITE_OTHER): Payer: Medicare Other | Admitting: Licensed Clinical Social Worker

## 2012-02-24 DIAGNOSIS — F331 Major depressive disorder, recurrent, moderate: Secondary | ICD-10-CM

## 2012-02-29 ENCOUNTER — Other Ambulatory Visit: Payer: Self-pay | Admitting: Cardiology

## 2012-03-06 ENCOUNTER — Telehealth: Payer: Self-pay | Admitting: Cardiology

## 2012-03-06 NOTE — Telephone Encounter (Signed)
Walk in Pt Form " Pt Needs paper for Disability Parking Card" completed sent to  Message Nurse 03/06/12/KM

## 2012-03-29 ENCOUNTER — Encounter (HOSPITAL_COMMUNITY): Payer: Self-pay | Admitting: *Deleted

## 2012-03-29 ENCOUNTER — Other Ambulatory Visit: Payer: Self-pay

## 2012-03-29 ENCOUNTER — Inpatient Hospital Stay (HOSPITAL_COMMUNITY)
Admission: EM | Admit: 2012-03-29 | Discharge: 2012-03-30 | DRG: 313 | Disposition: A | Discharge status: Home / Self Care | Payer: Medicare Other | Attending: Cardiology | Admitting: Cardiology

## 2012-03-29 ENCOUNTER — Emergency Department (HOSPITAL_COMMUNITY): Payer: Medicare Other

## 2012-03-29 DIAGNOSIS — J321 Chronic frontal sinusitis: Secondary | ICD-10-CM | POA: Diagnosis present

## 2012-03-29 DIAGNOSIS — I471 Supraventricular tachycardia, unspecified: Secondary | ICD-10-CM | POA: Diagnosis present

## 2012-03-29 DIAGNOSIS — Z951 Presence of aortocoronary bypass graft: Secondary | ICD-10-CM

## 2012-03-29 DIAGNOSIS — M549 Dorsalgia, unspecified: Secondary | ICD-10-CM | POA: Diagnosis present

## 2012-03-29 DIAGNOSIS — Z8546 Personal history of malignant neoplasm of prostate: Secondary | ICD-10-CM

## 2012-03-29 DIAGNOSIS — I10 Essential (primary) hypertension: Secondary | ICD-10-CM

## 2012-03-29 DIAGNOSIS — N183 Chronic kidney disease, stage 3 unspecified: Secondary | ICD-10-CM | POA: Diagnosis present

## 2012-03-29 DIAGNOSIS — R269 Unspecified abnormalities of gait and mobility: Secondary | ICD-10-CM | POA: Diagnosis present

## 2012-03-29 DIAGNOSIS — I25810 Atherosclerosis of coronary artery bypass graft(s) without angina pectoris: Secondary | ICD-10-CM

## 2012-03-29 DIAGNOSIS — E119 Type 2 diabetes mellitus without complications: Secondary | ICD-10-CM | POA: Diagnosis present

## 2012-03-29 DIAGNOSIS — I2510 Atherosclerotic heart disease of native coronary artery without angina pectoris: Secondary | ICD-10-CM | POA: Diagnosis present

## 2012-03-29 DIAGNOSIS — I129 Hypertensive chronic kidney disease with stage 1 through stage 4 chronic kidney disease, or unspecified chronic kidney disease: Secondary | ICD-10-CM | POA: Diagnosis present

## 2012-03-29 DIAGNOSIS — I4891 Unspecified atrial fibrillation: Secondary | ICD-10-CM | POA: Diagnosis present

## 2012-03-29 DIAGNOSIS — Z7982 Long term (current) use of aspirin: Secondary | ICD-10-CM

## 2012-03-29 DIAGNOSIS — R0789 Other chest pain: Principal | ICD-10-CM | POA: Diagnosis present

## 2012-03-29 DIAGNOSIS — R079 Chest pain, unspecified: Secondary | ICD-10-CM

## 2012-03-29 DIAGNOSIS — E7800 Pure hypercholesterolemia, unspecified: Secondary | ICD-10-CM | POA: Diagnosis present

## 2012-03-29 HISTORY — DX: Other supraventricular tachycardia: I47.19

## 2012-03-29 HISTORY — DX: Chronic kidney disease, stage 3 (moderate): N18.3

## 2012-03-29 HISTORY — DX: Unsteadiness on feet: R26.81

## 2012-03-29 HISTORY — DX: Supraventricular tachycardia: I47.1

## 2012-03-29 HISTORY — DX: Chronic kidney disease, stage 3 unspecified: N18.30

## 2012-03-29 LAB — CBC WITH DIFFERENTIAL/PLATELET
Basophils Absolute: 0 10*3/uL (ref 0.0–0.1)
Basophils Relative: 0 % (ref 0–1)
Eosinophils Relative: 4 % (ref 0–5)
HCT: 43 % (ref 39.0–52.0)
Lymphocytes Relative: 27 % (ref 12–46)
MCHC: 33.5 g/dL (ref 30.0–36.0)
Monocytes Absolute: 0.8 10*3/uL (ref 0.1–1.0)
Neutro Abs: 6.1 10*3/uL (ref 1.7–7.7)
Platelets: 215 10*3/uL (ref 150–400)
RDW: 14.2 % (ref 11.5–15.5)
WBC: 10 10*3/uL (ref 4.0–10.5)

## 2012-03-29 LAB — POCT I-STAT, CHEM 8
BUN: 23 mg/dL (ref 6–23)
Calcium, Ion: 1.2 mmol/L (ref 1.13–1.30)
Chloride: 107 mEq/L (ref 96–112)
HCT: 43 % (ref 39.0–52.0)
Sodium: 140 mEq/L (ref 135–145)
TCO2: 23 mmol/L (ref 0–100)

## 2012-03-29 LAB — CARDIAC PANEL(CRET KIN+CKTOT+MB+TROPI)
CK, MB: 2.5 ng/mL (ref 0.3–4.0)
Relative Index: INVALID (ref 0.0–2.5)
Relative Index: INVALID (ref 0.0–2.5)
Total CK: 42 U/L (ref 7–232)
Total CK: 34 U/L (ref 7–232)

## 2012-03-29 LAB — POCT I-STAT TROPONIN I: Troponin i, poc: 0.01 ng/mL (ref 0.00–0.08)

## 2012-03-29 MED ORDER — DABIGATRAN ETEXILATE MESYLATE 150 MG PO CAPS
150.0000 mg | ORAL_CAPSULE | Freq: Two times a day (BID) | ORAL | Status: DC
  Administered 2012-03-29 – 2012-03-30 (×2): 150 mg via ORAL
  Filled 2012-03-29 (×3): qty 1

## 2012-03-29 MED ORDER — LISINOPRIL 10 MG PO TABS
10.0000 mg | ORAL_TABLET | Freq: Every day | ORAL | Status: DC
  Administered 2012-03-30: 10 mg via ORAL
  Filled 2012-03-29 (×2): qty 1

## 2012-03-29 MED ORDER — ASPIRIN 81 MG PO CHEW
324.0000 mg | CHEWABLE_TABLET | Freq: Once | ORAL | Status: AC
  Administered 2012-03-29: 324 mg via ORAL
  Filled 2012-03-29: qty 4

## 2012-03-29 MED ORDER — REGADENOSON 0.4 MG/5ML IV SOLN
0.4000 mg | Freq: Once | INTRAVENOUS | Status: AC
  Administered 2012-03-30: 0.4 mg via INTRAVENOUS
  Filled 2012-03-29: qty 5

## 2012-03-29 MED ORDER — ONE-DAILY MULTI VITAMINS PO TABS: 1.0000 | ORAL_TABLET | Freq: Every day | ORAL | Status: DC

## 2012-03-29 MED ORDER — ACETAMINOPHEN 325 MG PO TABS: 650.0000 mg | ORAL_TABLET | ORAL | Status: DC | PRN

## 2012-03-29 MED ORDER — INSULIN ASPART 100 UNIT/ML ~~LOC~~ SOLN
0.0000 [IU] | Freq: Three times a day (TID) | SUBCUTANEOUS | Status: DC
  Administered 2012-03-30: 3 [IU] via SUBCUTANEOUS

## 2012-03-29 MED ORDER — LEVOTHYROXINE SODIUM 75 MCG PO TABS
75.0000 ug | ORAL_TABLET | Freq: Every day | ORAL | Status: DC
  Administered 2012-03-30: 75 ug via ORAL
  Filled 2012-03-29 (×2): qty 1

## 2012-03-29 MED ORDER — ASPIRIN 81 MG PO TABS: 81.0000 mg | ORAL_TABLET | Freq: Every day | ORAL | Status: DC

## 2012-03-29 MED ORDER — TEMAZEPAM 15 MG PO CAPS
15.0000 mg | ORAL_CAPSULE | Freq: Every day | ORAL | Status: DC
  Administered 2012-03-29: 15 mg via ORAL
  Filled 2012-03-29 (×2): qty 1

## 2012-03-29 MED ORDER — TAMSULOSIN HCL 0.4 MG PO CAPS
0.4000 mg | ORAL_CAPSULE | Freq: Every day | ORAL | Status: DC
  Administered 2012-03-29 – 2012-03-30 (×2): 0.4 mg via ORAL
  Filled 2012-03-29 (×2): qty 1

## 2012-03-29 MED ORDER — NITROGLYCERIN 0.4 MG SL SUBL: 0.4000 mg | SUBLINGUAL_TABLET | SUBLINGUAL | Status: DC | PRN

## 2012-03-29 MED ORDER — ATORVASTATIN CALCIUM 20 MG PO TABS
20.0000 mg | ORAL_TABLET | Freq: Every day | ORAL | Status: DC
  Filled 2012-03-29 (×2): qty 1

## 2012-03-29 MED ORDER — POTASSIUM CHLORIDE CRYS ER 10 MEQ PO TBCR
10.0000 meq | EXTENDED_RELEASE_TABLET | Freq: Every day | ORAL | Status: DC
  Administered 2012-03-30: 10 meq via ORAL
  Filled 2012-03-29 (×2): qty 1

## 2012-03-29 MED ORDER — GLIPIZIDE 10 MG PO TABS
10.0000 mg | ORAL_TABLET | Freq: Every day | ORAL | Status: DC
  Administered 2012-03-30: 10 mg via ORAL
  Filled 2012-03-29 (×2): qty 1

## 2012-03-29 MED ORDER — FINASTERIDE 5 MG PO TABS
5.0000 mg | ORAL_TABLET | Freq: Every day | ORAL | Status: DC
  Administered 2012-03-30: 5 mg via ORAL
  Filled 2012-03-29 (×2): qty 1

## 2012-03-29 MED ORDER — SERTRALINE HCL 100 MG PO TABS
100.0000 mg | ORAL_TABLET | Freq: Every day | ORAL | Status: DC
  Administered 2012-03-30: 100 mg via ORAL
  Filled 2012-03-29 (×2): qty 1

## 2012-03-29 MED ORDER — ASPIRIN 81 MG PO CHEW
81.0000 mg | CHEWABLE_TABLET | Freq: Every day | ORAL | Status: DC
  Administered 2012-03-30: 81 mg via ORAL
  Filled 2012-03-29: qty 1

## 2012-03-29 MED ORDER — ONDANSETRON HCL 4 MG/2ML IJ SOLN: 4.0000 mg | Freq: Four times a day (QID) | INTRAMUSCULAR | Status: DC | PRN

## 2012-03-29 MED ORDER — ADULT MULTIVITAMIN W/MINERALS CH
1.0000 | ORAL_TABLET | Freq: Every day | ORAL | Status: DC
  Administered 2012-03-30: 1 via ORAL
  Filled 2012-03-29 (×2): qty 1

## 2012-03-29 MED ORDER — FUROSEMIDE 20 MG PO TABS
20.0000 mg | ORAL_TABLET | Freq: Every day | ORAL | Status: DC
  Administered 2012-03-30: 20 mg via ORAL
  Filled 2012-03-29 (×2): qty 1

## 2012-03-29 MED ORDER — PREDNISONE 5 MG PO TABS
5.0000 mg | ORAL_TABLET | ORAL | Status: DC
  Filled 2012-03-29: qty 1

## 2012-03-29 NOTE — ED Notes (Signed)
Pt reports mid chest pains that started two days ago, describes pain as dull and non radiating. Denies sob. ekg being done at triage, airway intact, resp e/u.

## 2012-03-29 NOTE — H&P (Signed)
Patient ID: KING PINZON MRN: [id number], DOB/AGE: 1928-01-26   Admit date: 03/29/2012  Primary Physician: Minda Meo, MD Primary Cardiologist: T. Riley Kill, MD/ EP: Odessa Fleming, MD  Pt. Profile:  78 y/o male with h/o severe CAD who presents with a 2 day h/o chest pain.  Problem List  Past Medical History  Diagnosis Date  . CAD (coronary artery disease)     a. 1990 CABGx3: LIMA->LAD, VG->OM, VG->dRCA;  b. 05/2007 VF Arrest/Cath/PCI: 100 VG->OM tx w/ BMS, 95 VG->RCA tx w/ bms;  c. 06/2007 90 Native PDA tx w DES ;  d. 05/2009 Echo EF 60-65%, nl WM, mild MR.  Marland Kitchen HTN (hypertension)   . Hypercholesteremia   . Diabetes mellitus   . Atrial fibrillation     a. had been on tikosyn -> d/c 09/2010;  b. Was on Amio ->d/c 09/2011;  c.  anticoagulated w/ Pradaxa  . Junctional ectopic tachycardia     a. noted 09/2010  . Sinus bradycardia   . CKD (chronic kidney disease), stage III   . Back pain     a. Followed by [facility] - on prednisone  . Prostate cancer   . Unsteady gait     a. in rehab for core strengthening    Past Surgical History  Procedure Date  . Coronary artery bypass graft 1990  . Prostate surgery      Allergies  No Known Allergies  HPI  78 y/o male with the above problem list.  He is s/p CABG x 3 in 1990 and has had multiple percutaneous interventions since. The last of these took place in the fall of 2008 with stenting of the vein graft to the OM, vein graft to the RCA and also the native PDA.  He has not required relook catheter intervention since then. He reports that he has been doing well over the past few years without chest pain or activity limiting dyspnea. Most recently, he has been taking place in rehabilitation for core strengthening and gait stability. He does this 3 times a week and does a fair amount of exertion without chest pain or dyspnea. He last did this on Friday. Over the past 2 mornings, patient has awakened with 6-7 at 10 left chest dull aching discomfort  without associated symptoms. When he gets up in starts his morning, symptoms eased off over a period of 30 minutes and then resolve completely. He had no recurrence of chest discomfort throughout the day yesterday. He had mild nausea last night prior to going to bed but this was not in the setting of chest pain or dyspnea. This morning when he awoke at about 8 AM he noted recurrence of chest discomfort. Again, this lasted about 30 minutes and resolve spontaneously. It is similar to though not as severe as prior angina. Because of symptoms, he discussed it with his son and they determined he should be seen in the ER for evaluation. Here, ECG is nonacute and his point-of-care troponin is normal. He is currently pain-free.  Home Medications  Prior to Admission medications   Medication Sig Start Date End Date Taking? Authorizing Provider  aspirin 81 MG tablet Take 81 mg by mouth. Every other day   Yes Historical Provider, MD  CRESTOR 10 MG tablet take 1 tablet by mouth once daily 10/06/11  Yes Herby Abraham, MD  finasteride (PROSCAR) 5 MG tablet Take 1 tablet by mouth Daily. 12/17/10  Yes Historical Provider, MD  furosemide (LASIX) 20 MG tablet take 1  tablet by mouth once daily 12/18/11  Yes Herby Abraham, MD  glipiZIDE (GLUCOTROL) 10 MG tablet Take 10 mg by mouth daily.     Yes Historical Provider, MD  KLOR-CON M10 10 MEQ tablet take 1 tablet by mouth once daily 04/24/11  Yes Herby Abraham, MD  levothyroxine (SYNTHROID, LEVOTHROID) 50 MCG tablet Take 75 mcg by mouth daily.   06/19/11  Yes Herby Abraham, MD  lisinopril (PRINIVIL,ZESTRIL) 10 MG tablet take 1 tablet by mouth once daily 02/29/12  Yes Herby Abraham, MD  Multiple Vitamin (MULTIVITAMIN) tablet Take 1 tablet by mouth daily.     Yes Historical Provider, MD  PRADAXA 150 MG CAPS take 1 capsule by mouth twice a day 10/06/11  Yes Herby Abraham, MD  predniSONE (DELTASONE) 5 MG tablet Take one every other day   Yes Historical Provider, MD    sertraline (ZOLOFT) 100 MG tablet Take 100 mg by mouth daily.   Yes Historical Provider, MD  Tamsulosin HCl (FLOMAX) 0.4 MG CAPS Take 0.4 mg by mouth daily after supper.   Yes Historical Provider, MD  temazepam (RESTORIL) 15 MG capsule Take 15 mg by mouth at bedtime.   Yes Historical Provider, MD  NITROSTAT 0.4 MG SL tablet PLACE 1 TABLET UNDER THE TONGUE AS DIRECTED IF NEEDED EVERY 5 MINUTES UP TO 3 DOSES IF NEEDED FOR CHEST PAIN 08/20/11   Herby Abraham, MD    Family History  Family History  Problem Relation Age of Onset  . Other Father     died of unknown cause @ young age  . Coronary artery disease Mother     died in late 41's w/ "hardening of the arteries"    Social History  History   Social History  . Marital Status: Widowed    Spouse Name: N/A    Number of Children: N/A  . Years of Education: N/A   Occupational History  . Not on file.   Social History Main Topics  . Smoking status: Former Smoker    Quit date: 09/23/1988  . Smokeless tobacco: Never Used   Comment: quit 20+ years ago  . Alcohol Use: No  . Drug Use: No  . Sexually Active: Not on file   Other Topics Concern  . Not on file   Social History Narrative   The patient resides in [place] alone.  He is widowed.  He has 4 children, 9 grandchildren.  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  He denies any alcohol, drugs, or herbal medication.  He tries  to maintain a low-fat diet.  He states that he does exercise somewhat with walking, and he uses a stationary bike for a few minutes every other day.        Review of Systems General:  No chills, fever, night sweats or weight changes.  Cardiovascular:  Mild left chest aching as outlined above.  No dyspnea on exertion, edema, orthopnea, palpitations, paroxysmal nocturnal dyspnea. Dermatological: No rash, lesions/masses Respiratory: No cough, dyspnea Urologic: No hematuria, dysuria Abdominal:   Had mild nausea  last PM.  Didn't last long.  No vomiting, diarrhea, bright red blood per rectum, melena, or hematemesis Neurologic:  No visual changes, wkns, changes in mental status. All other systems reviewed and are otherwise negative except as noted above.  Physical Exam  Blood pressure 137/65, pulse 61, temperature 96.6 F (35.9 C), temperature source Oral, resp. rate 20, SpO2 99.00%.  General: Pleasant, NAD Psych: Normal affect. Neuro: Alert and oriented X 3. Moves all extremities spontaneously. HEENT: Normal  Neck: Supple without bruits or JVD. Lungs:  Resp regular and unlabored, CTA. Heart: RRR no s3, s4, or murmurs. Abdomen: Soft, non-tender, non-distended, BS + x 4.  Extremities: No clubbing, cyanosis or edema. PT/Radials 1+ and equal bilaterally.  Labs  POC Trop I 0.01  Lab Results  Component Value Date   WBC 10.0 03/29/2012   HGB 14.6 03/29/2012   HCT 43.0 03/29/2012   MCV 84.5 03/29/2012   PLT 215 03/29/2012     Lab 03/29/12 1149  NA 140  K 4.4  CL 107  CO2 --  BUN 23  CREATININE 1.50*  CALCIUM --  PROT --  BILITOT --  ALKPHOS --  ALT --  AST --  GLUCOSE 88    Radiology/Studies  Dg Chest 2 View  03/29/2012  *RADIOLOGY REPORT*  Clinical Data: Chest pain.  Cough.  Ex-smoker.  CHEST - 2 VIEW  Comparison: 06/05/2011.  Findings: Stable mildly enlarged cardiac silhouette and post CABG changes.  Stable calcified granuloma in the right upper lobe. Otherwise, clear lungs with normal vascularity.  Thoracic spine degenerative changes, including changes of DISH.  IMPRESSION:  1.  No acute abnormality. 2.  Stable cardiomegaly and right lung calcified granuloma.  Original Report Authenticated By: Darrol Angel, M.D.   ECG  RSR, 61, no acute changes.  ASSESSMENT AND PLAN  1. Left-sided chest pain/coronary artery disease: Patient presents with a two-day history of dull chest aching lasting about 30 minutes, without associated symptoms, and resolving spontaneously. Both episodes occurred  while lying in bed in the morning and resolve once he got up and walked around some. His first troponin is normal. We'll plan to observe her overnight and continue to cycle cardiac enzymes. Continue his home medications. If he has recurrent symptoms or rules in we will need to consider diagnostic catheterization understanding of course the patient has chronic kidney disease with a baseline creatinine of approximately 1.4, and is also on chronic pradaxa therapy.  2. Hypertension: Stable continue home meds.  3. Hyperlipidemia: Check lipids and LFTs. Continue statin therapy.  4. Diabetes mellitus: Continue glipizide. Add sliding scale insulin.  5. Atrial fibrillation: Patient is currently in sinus rhythm. He is anticoagulated with pradaxa.  6. Back pain: He is on prednisone therapy for this.  7. Stage III chronic kidney disease: Creatinine 1.5.  This appears to be stable with his prior trend.  Signed, Nicolasa Ducking, NP 03/29/2012, 1:45 PM   History and all data above reviewed.  Patient examined.  I agree with the findings as above. Pleasant patient who has been waking up with some vague chest pain that persists in the AM and goes away spontaneously.  He doesn't think it is like his previous angina.  He does not describe associated symptoms.  He cannot bring it on although he is not overly active secondary to some gait issues.  He has been doing some PT.   The patient exam reveals COR: RRR,  Lungs: Clear,  Abd: Positive bowel sounds, no rebound no guarding, Ext no edema  .  All available labs, radiology testing, previous records reviewed. Agree with documented assessment and plan. Atypical pain.  However, his grafts are over 70 years old and he has not had imaging since 2008 as far as I know.  I would suggest stress perfusion imaging which can be done as an in patient.    Fayrene Fearing  Renwick Asman  3:03 PM  03/29/2012

## 2012-03-29 NOTE — Progress Notes (Signed)
03/29/12 1506  OTHER  CSW Follow Up Status Follow-up required    Unit based LCSW to be consulted for psychosocial support/intervnetions. Pt is with noted increased depression 2/2 Alzheimer's dx. Pt was referred to Dr. Orland Mustard for evaluation/f/u. Pt's sons are with increased concerns.   Dionne Milo MSW [facility] Emergency Dept. Weekend/Social Worker [phone]

## 2012-03-29 NOTE — Progress Notes (Signed)
Patient has had all his daily meds except flomax which he takes at night. Stanly Si, Chrystine Oiler

## 2012-03-29 NOTE — ED Provider Notes (Signed)
History     CSN: [id number]  Arrival date & time 03/29/12  1055   First MD Initiated Contact with Patient 03/29/12 1111      Chief Complaint  Patient presents with  . Chest Pain    (Consider location/radiation/quality/duration/timing/severity/associated sxs/prior treatment) Patient is a 78 y.o. male presenting with chest pain. The history is provided by the patient.  Chest Pain The chest pain began 2 days ago. Chest pain occurs intermittently. The chest pain is resolved. At its most intense, the pain is at 7/10. The pain is currently at 0/10. Primary symptoms include shortness of breath and nausea. Pertinent negatives for primary symptoms include no fever, no cough, no palpitations, no abdominal pain, no vomiting and no dizziness.  Associated symptoms include weakness.  Pertinent negatives for associated symptoms include no numbness.   Pt reports chest pains on and off for last two days. Mainly in the morning. States pain non exertional. Associated with some SOB. Last night felt nauseated, however no chest pain. Currently symptom free. No fever, chills, cough, malaise. Hx of MI, CABG and stenting in the past.   Past Medical History  Diagnosis Date  . CAD (coronary artery disease)      BYPASS GRAFT EF 40-45% (ICD-414.02)  . HTN (hypertension)   . Hypercholesteremia   . Diabetes mellitus   . Atrial fibrillation   . Tachycardia   . Sinus bradycardia   . Renal disease   . Edema   . Back pain   . Prostate cancer   . MI (myocardial infarction)     Past Surgical History  Procedure Date  . Coronary artery bypass graft 1990  . Prostate surgery     History reviewed. No pertinent family history.  History  Substance Use Topics  . Smoking status: Former Smoker    Quit date: 09/23/1988  . Smokeless tobacco: Never Used   Comment: quit 20+ years ago  . Alcohol Use: No      Review of Systems  Constitutional: Negative for fever and chills.  Respiratory: Positive for chest  tightness and shortness of breath. Negative for cough.   Cardiovascular: Positive for chest pain. Negative for palpitations and leg swelling.  Gastrointestinal: Positive for nausea. Negative for vomiting and abdominal pain.  Skin: Negative.   Neurological: Positive for weakness. Negative for dizziness and numbness.    Allergies  Review of patient's allergies indicates no known allergies.  Home Medications   Current Outpatient Rx  Name Route Sig Dispense Refill  . ASPIRIN 81 MG PO TABS Oral Take 81 mg by mouth. Every other day    . CRESTOR 10 MG PO TABS  take 1 tablet by mouth once daily 90 tablet 3  . FINASTERIDE 5 MG PO TABS Oral Take 1 tablet by mouth Daily.    . FUROSEMIDE 20 MG PO TABS  take 1 tablet by mouth once daily 30 tablet 8  . GLIPIZIDE 10 MG PO TABS Oral Take 10 mg by mouth daily.      Marland Kitchen KLOR-CON M10 10 MEQ PO TBCR  take 1 tablet by mouth once daily 30 tablet 2  . LEVOTHYROXINE SODIUM 50 MCG PO TABS Oral Take 75 mcg by mouth daily.      Marland Kitchen LISINOPRIL 10 MG PO TABS  take 1 tablet by mouth once daily 30 tablet 6  . ONE-DAILY MULTI VITAMINS PO TABS Oral Take 1 tablet by mouth daily.      Marland Kitchen PRADAXA 150 MG PO CAPS  take 1 capsule  by mouth twice a day 60 capsule 3  . PREDNISONE 5 MG PO TABS  Take one every other day    . SERTRALINE HCL 100 MG PO TABS Oral Take 100 mg by mouth daily.    Marland Kitchen TAMSULOSIN HCL 0.4 MG PO CAPS Oral Take 0.4 mg by mouth daily after supper.    Marland Kitchen TEMAZEPAM 15 MG PO CAPS Oral Take 15 mg by mouth at bedtime.    Marland Kitchen NITROSTAT 0.4 MG SL SUBL  PLACE 1 TABLET UNDER THE TONGUE AS DIRECTED IF NEEDED EVERY 5 MINUTES UP TO 3 DOSES IF NEEDED FOR CHEST PAIN 25 tablet 1    BP 137/65  Pulse 61  Temp 96.6 F (35.9 C) (Oral)  Resp 20  SpO2 99%  Physical Exam  Nursing note and vitals reviewed. Constitutional: He appears well-developed and well-nourished. No distress.  Eyes: Conjunctivae are normal.  Neck: Neck supple.  Cardiovascular: Normal rate, regular rhythm and  normal heart sounds.   Pulmonary/Chest: Effort normal and breath sounds normal. No respiratory distress. He has no wheezes. He has no rales.  Abdominal: Soft. Bowel sounds are normal. He exhibits no distension. There is no tenderness.  Musculoskeletal: Normal range of motion. He exhibits no edema.  Skin: Skin is warm and dry.  Psychiatric: He has a normal mood and affect.    ED Course  Procedures (including critical care time)  Pt with cardiac hx, here with cp. Chest pain atypical, however, strong cardiac history. Will start aspirin, labs, CXR, monitor.    Date: 03/29/2012  Rate: 61  Rhythm: normal sinus rhythm  QRS Axis: normal  Intervals: normal  ST/T Wave abnormalities: nonspecific T wave changes  Conduction Disutrbances:none  Narrative Interpretation:   Old EKG Reviewed: changes noted  Junctional rhythm resolve, prolonged QT resolved    Results for orders placed during the hospital encounter of 03/29/12  CBC WITH DIFFERENTIAL      Component Value Range   WBC 10.0  4.0 - 10.5 K/uL   RBC 5.09  4.22 - 5.81 MIL/uL   Hemoglobin 14.4  13.0 - 17.0 g/dL   HCT 91.4  78.2 - 95.6 %   MCV 84.5  78.0 - 100.0 fL   MCH 28.3  26.0 - 34.0 pg   MCHC 33.5  30.0 - 36.0 g/dL   RDW 21.3  08.6 - 57.8 %   Platelets 215  150 - 400 K/uL   Neutrophils Relative 61  43 - 77 %   Neutro Abs 6.1  1.7 - 7.7 K/uL   Lymphocytes Relative 27  12 - 46 %   Lymphs Abs 2.7  0.7 - 4.0 K/uL   Monocytes Relative 8  3 - 12 %   Monocytes Absolute 0.8  0.1 - 1.0 K/uL   Eosinophils Relative 4  0 - 5 %   Eosinophils Absolute 0.4  0.0 - 0.7 K/uL   Basophils Relative 0  0 - 1 %   Basophils Absolute 0.0  0.0 - 0.1 K/uL  POCT I-STAT, CHEM 8      Component Value Range   Sodium 140  135 - 145 mEq/L   Potassium 4.4  3.5 - 5.1 mEq/L   Chloride 107  96 - 112 mEq/L   BUN 23  6 - 23 mg/dL   Creatinine, Ser 4.69 (*) 0.50 - 1.35 mg/dL   Glucose, Bld 88  70 - 99 mg/dL   Calcium, Ion 6.29  5.28 - 1.30 mmol/L   TCO2 23   0 -  100 mmol/L   Hemoglobin 14.6  13.0 - 17.0 g/dL   HCT 11.9  14.7 - 82.9 %  POCT I-STAT TROPONIN I      Component Value Range   Troponin i, poc 0.01  0.00 - 0.08 ng/mL   Comment 3            Dg Chest 2 View  03/29/2012  *RADIOLOGY REPORT*  Clinical Data: Chest pain.  Cough.  Ex-smoker.  CHEST - 2 VIEW  Comparison: 06/05/2011.  Findings: Stable mildly enlarged cardiac silhouette and post CABG changes.  Stable calcified granuloma in the right upper lobe. Otherwise, clear lungs with normal vascularity.  Thoracic spine degenerative changes, including changes of DISH.  IMPRESSION:  1.  No acute abnormality. 2.  Stable cardiomegaly and right lung calcified granuloma.  Original Report Authenticated By: Darrol Angel, M.D.     12:39 PM Given pt's hx, although atypical pain, will consult cardiology.  Spoke with cardiology will come see pt.  1. Coronary atherosclerosis of artery bypass graft       MDM          Lottie Mussel, PA 03/29/12 1704

## 2012-03-29 NOTE — Progress Notes (Signed)
03/29/12 1505  Discharge Planning  Type of Residence Private residence  Home Care Services No  Support Systems Children  Do you have any problems obtaining your medications? No  Family/patient expects to be discharged to: Private residence  Once you are discharged, how will you get to your follow-up appointment? Family  Expected Discharge Date 04/02/12  Case Management Consult Needed No  Social Work Consult Needed Yes (Comment)

## 2012-03-30 ENCOUNTER — Inpatient Hospital Stay (HOSPITAL_COMMUNITY): Payer: Medicare Other

## 2012-03-30 ENCOUNTER — Other Ambulatory Visit: Payer: Self-pay

## 2012-03-30 ENCOUNTER — Encounter (HOSPITAL_COMMUNITY): Payer: Self-pay | Admitting: Cardiology

## 2012-03-30 DIAGNOSIS — I25810 Atherosclerosis of coronary artery bypass graft(s) without angina pectoris: Secondary | ICD-10-CM

## 2012-03-30 DIAGNOSIS — R079 Chest pain, unspecified: Secondary | ICD-10-CM

## 2012-03-30 LAB — COMPREHENSIVE METABOLIC PANEL
ALT: 13 U/L (ref 0–53)
AST: 19 U/L (ref 0–37)
CO2: 24 mEq/L (ref 19–32)
Chloride: 105 mEq/L (ref 96–112)
Creatinine, Ser: 1.26 mg/dL (ref 0.50–1.35)
GFR calc non Af Amer: 51 mL/min — ABNORMAL LOW (ref >90)
Glucose, Bld: 129 mg/dL — ABNORMAL HIGH (ref 70–99)
Sodium: 138 mEq/L (ref 135–145)
Total Bilirubin: 0.2 mg/dL — ABNORMAL LOW (ref 0.3–1.2)

## 2012-03-30 LAB — LIPID PANEL
LDL Cholesterol: 48 mg/dL (ref 0–99)
Total CHOL/HDL Ratio: 3.9 RATIO
VLDL: 33 mg/dL (ref 0–40)

## 2012-03-30 LAB — CARDIAC PANEL(CRET KIN+CKTOT+MB+TROPI): Relative Index: INVALID (ref 0.0–2.5)

## 2012-03-30 LAB — HEMOGLOBIN A1C: Hgb A1c MFr Bld: 7.1 % — ABNORMAL HIGH (ref <5.7)

## 2012-03-30 LAB — GLUCOSE, CAPILLARY: Glucose-Capillary: 119 mg/dL — ABNORMAL HIGH (ref 70–99)

## 2012-03-30 MED ORDER — REGADENOSON 0.4 MG/5ML IV SOLN
INTRAVENOUS | Status: AC
  Administered 2012-03-30: 0.4 mg via INTRAVENOUS
  Filled 2012-03-30: qty 5

## 2012-03-30 MED ORDER — TECHNETIUM TC 99M TETROFOSMIN IV KIT
30.0000 | PACK | Freq: Once | INTRAVENOUS | Status: AC | PRN
  Administered 2012-03-30: 30 via INTRAVENOUS

## 2012-03-30 MED ORDER — TECHNETIUM TC 99M TETROFOSMIN IV KIT
10.0000 | PACK | Freq: Once | INTRAVENOUS | Status: AC | PRN
  Administered 2012-03-30: 10 via INTRAVENOUS

## 2012-03-30 NOTE — Discharge Summary (Signed)
Discharge Summary   Patient ID: Shawn [name] MRN: [id number], DOB/AGE: 1927/10/11 78 y.o.  Primary MD: Minda Meo, MD Primary Cardiologist: Shawnie Pons MD Admit date: 03/29/2012 D/C date:     03/30/2012      Primary Discharge Diagnoses:  1. Chest Pain, noncardiac  - Normal Lexiscan Myoview 03/30/12   Secondary Discharge Diagnoses:  1. CAD (coronary artery disease) - 1990 CABGx3: LIMA->LAD, VG->OM, VG->dRCA; 05/2007 VF Arrest/Cath/PCI: 100 VG->OM tx w/ BMS, 95 VG->RCA tx w/ bms; 06/2007 90 Native PDA tx w DES; 03/30/12 normal lexiscan myoview  2. Atrial Fibrillation - had been on tikosyn -> d/c 09/2010; Was on Amio ->d/c 09/2011; anticoagulated w/ Pradaxa 3. Junctional ectopic tachycardia  4. Sinus bradycardia  5. Hypertension 6. Hyperlipidemia 7. Diabetes mellitus, Type 2 8. Stage III CKD - baseline Crt ~1.4 9. Back pain - Followed by [facility] - on prednisone  10. Prostate cancer  11. Unsteady gait - in rehab for core strengthening   Allergies No Known Allergies  Diagnostic Studies/Procedures:   03/30/2012 - Nm Myocar Multi W/spect W/wall Motion / Ef This patient underwent Lexiscan Myoview stress test under supervision of [facility] staff.  Patient did not develop any chest pain or EKG changes with stress.  Quality of the images was satisfactory for interpretation.  Perfusion imaging shows no ischemia or infarction.  The End Diastolic Volume is 90 ml.  The End Systolic Volume is 43 ml.   The Ejection Fraction is 53%. There are no segmental wall motion abnormalities.  Impression: Normal Lexiscan Myoview stress test.    History of Present Illness: 78 y.o. male w/ the above medical problems who presented to [facility] on 03/29/12 with complaints of chest pain.  Over the 2 mornings prior to presentation, the patient had awakened with left chest dull aching discomfort without associated symptoms. The symptoms eased off over a period of 30 minutes and then resolved  completely. The morning of presentation, when he awoke at about 8 AM, he noted recurrence of chest discomfort. Again, this lasted about 30 minutes and resolved spontaneously. It was similar to though not as severe as prior angina. Because of those symptoms, he discussed it with his son and they determined he should be seen in the ER for evaluation.  Hospital Course: EKG revealed NSR with no acute ST/T changes. CXR was without acute cardiopulmonary abnormalities. Labs were significant for normal poc troponin. He was admitted for further evaluation and treatment.   Cardiac enzymes were cycled and remained negative. It was felt his chest pain was atypical, but given his cardiac history plans were made for stress testing. Lexiscan myoview on 03/30/12 revealed no ischemia, infarction, or segmental wall motion abnormalities, EF 53%. He had no further chest pain. He was seen and evaluated by Dr. Riley Kill who felt he was stable for discharge home with plans for follow up as scheduled below.  Discharge Vitals: Blood pressure 132/68, pulse 66, temperature 97.3 F (36.3 C), temperature source Oral, resp. rate 18, height 5\' 11"  (1.803 m), weight 218 lb (98.884 kg), SpO2 96.00%.  Labs: Lab Results  Component Value Date   WBC 10.0 03/29/2012   HGB 14.6 03/29/2012   HCT 43.0 03/29/2012   MCV 84.5 03/29/2012   PLT 215 03/29/2012     Lab 03/30/12 0536  NA 138  K 4.0  CL 105  CO2 24  BUN 20  CREATININE 1.26  CALCIUM 8.8  PROT 6.0  BILITOT 0.2*  ALKPHOS 61  ALT 13  AST 19  GLUCOSE 129*    Basename 03/30/12 0536 03/29/12 2130 03/29/12 1623  CKTOTAL 29 34 42  CKMB 2.1 2.5 2.7  TROPONINI <0.30 <0.30 <0.30   Lab Results  Component Value Date   CHOL 109 03/30/2012   HDL 28* 03/30/2012   LDLCALC 48 03/30/2012   TRIG 166* 03/30/2012     Discharge Medications   Medication List  As of 03/30/2012  2:24 PM   TAKE these medications         aspirin 81 MG tablet   Take 81 mg by mouth. Every other day      CRESTOR  10 MG tablet   Generic drug: rosuvastatin   take 1 tablet by mouth once daily      finasteride 5 MG tablet   Commonly known as: PROSCAR   Take 1 tablet by mouth Daily.      furosemide 20 MG tablet   Commonly known as: LASIX   take 1 tablet by mouth once daily      glipiZIDE 10 MG tablet   Commonly known as: GLUCOTROL   Take 10 mg by mouth daily.      KLOR-CON M10 10 MEQ tablet   Generic drug: potassium chloride   take 1 tablet by mouth once daily      levothyroxine 50 MCG tablet   Commonly known as: SYNTHROID, LEVOTHROID   Take 75 mcg by mouth daily.      lisinopril 10 MG tablet   Commonly known as: PRINIVIL,ZESTRIL   take 1 tablet by mouth once daily      multivitamin tablet   Take 1 tablet by mouth daily.      NITROSTAT 0.4 MG SL tablet   Generic drug: nitroGLYCERIN   PLACE 1 TABLET UNDER THE TONGUE AS DIRECTED IF NEEDED EVERY 5 MINUTES UP TO 3 DOSES IF NEEDED FOR CHEST PAIN      PRADAXA 150 MG Caps   Generic drug: dabigatran   take 1 capsule by mouth twice a day      predniSONE 5 MG tablet   Commonly known as: DELTASONE   Take one every other day      sertraline 100 MG tablet   Commonly known as: ZOLOFT   Take 100 mg by mouth daily.      Tamsulosin HCl 0.4 MG Caps   Commonly known as: FLOMAX   Take 0.4 mg by mouth daily after supper.      temazepam 15 MG capsule   Commonly known as: RESTORIL   Take 15 mg by mouth at bedtime.            Disposition   Discharge Orders    Future Appointments: Provider: Department: Dept Phone: Center:   04/13/2012 3:30 PM Herby Abraham, MD [facility] [phone] [id number]   05/28/2012 11:30 AM Milas Gain, MD [facility] [phone] None     Future Orders Please Complete By Expires   Diet - low sodium heart healthy      Increase activity slowly      Discharge instructions      Comments:   **PLEASE REMEMBER TO BRING ALL OF YOUR MEDICATIONS TO EACH OF YOUR FOLLOW-UP OFFICE VISITS.      Follow-up Information    Follow up with Shawnie Pons, MD on 04/13/2012. (3:30)    Contact information:   [facility] [address] [place] [address] [phone]        Follow up with  ARONSON,RICHARD A, MD. (As needed)    Contact information:   [address] [facility] [place] [place] [address] [phone]           Outstanding Labs/Studies:  None  Duration of Discharge Encounter: Greater than 30 minutes including physician and PA time.  Signed, Makalyn Lennox PA-C 03/30/2012, 2:24 PM

## 2012-03-30 NOTE — Progress Notes (Signed)
Cardiology Progress Note Patient Name: Shawn [name] Date of Encounter: 03/30/2012, 10:06 AM     Subjective  No overnight events. Denies chest pain or sob.   Objective   Telemetry: Sinus rhythm 50-80s  Medications: . aspirin  324 mg Oral Once  . aspirin  81 mg Oral Daily  . atorvastatin  20 mg Oral q1800  . dabigatran  150 mg Oral Q12H  . finasteride  5 mg Oral Daily  . furosemide  20 mg Oral Daily  . glipiZIDE  10 mg Oral Daily  . insulin aspart  0-15 Units Subcutaneous TID WC  . levothyroxine  75 mcg Oral Daily  . lisinopril  10 mg Oral Daily  . multivitamin with minerals  1 tablet Oral Daily  . potassium chloride  10 mEq Oral Daily  . predniSONE  5 mg Oral QODAY  . regadenoson  0.4 mg Intravenous Once  . sertraline  100 mg Oral Daily  . Tamsulosin HCl  0.4 mg Oral QPC supper  . temazepam  15 mg Oral QHS    Physical Exam:  Temp:  [96.6 F (35.9 C)-98 F (36.7 C)] 97.9 F (36.6 C) (07/08 0541) Pulse Rate:  [52-61] 54  (07/08 0541) Resp:  [14-20] 18  (07/08 0541) BP: (130-183)/(56-84) 139/67 mmHg (07/08 0541) SpO2:  [94 %-100 %] 94 % (07/08 0541) Weight:  [212 lb 15.4 oz (96.6 kg)-218 lb (98.884 kg)] 218 lb (98.884 kg) (07/07 1831)  General: Pleasant elderly white male, in no acute distress. Head: Normocephalic, atraumatic, sclera non-icteric, nares are without discharge.  Neck: Supple. No JVD Lungs: Clear bilaterally to auscultation without wheezes, rales, or rhonchi. Breathing is unlabored. Heart: RRR S1 S2 without murmurs, rubs, or gallops.  Abdomen: Soft, non-tender, non-distended with normoactive bowel sounds. No rebound/guarding. No obvious abdominal masses. Msk:  Strength and tone appear normal for age. Extremities: Trace BLE edema. No clubbing or cyanosis. Distal pedal pulses are intact and equal bilaterally. Neuro: Alert and oriented X 3. Moves all extremities spontaneously. Psych:  Responds to questions appropriately with a normal  affect.   Intake/Output Summary (Last 24 hours) at 03/30/12 1006 Last data filed at 03/29/12 2343  Gross per 24 hour  Intake      0 ml  Output    600 ml  Net   -600 ml    Labs:  [facility] 03/30/12 0536 03/29/12 1149  NA 138 140  K 4.0 4.4  CL 105 107  CO2 24 --  GLUCOSE 129* 88  BUN 20 23  CREATININE 1.26 1.50*  CALCIUM 8.8 --  MG -- --  PHOS -- --    Basename 03/30/12 0536  AST 19  ALT 13  ALKPHOS 61  BILITOT 0.2*  PROT 6.0  ALBUMIN 2.8*    Basename 03/29/12 1149 03/29/12 1137  WBC -- 10.0  NEUTROABS -- 6.1  HGB 14.6 14.4  HCT 43.0 43.0  MCV -- 84.5  PLT -- 215    Basename 03/30/12 0536 03/29/12 2130 03/29/12 1623  CKTOTAL 29 34 42  CKMB 2.1 2.5 2.7  TROPONINI <0.30 <0.30 <0.30    Basename 03/30/12 0536  CHOL 109  HDL 28*  LDLCALC 48  TRIG 478*  CHOLHDL 3.9   Radiology/Studies:   03/29/2012 - Chest 2 View Findings: Stable mildly enlarged cardiac silhouette and post CABG changes.  Stable calcified granuloma in the right upper lobe. Otherwise, clear lungs with normal vascularity.  Thoracic spine degenerative changes, including changes of DISH.  IMPRESSION:  1.  No acute abnormality. 2.  Stable cardiomegaly and right lung calcified granuloma.      Assessment and Plan  78 y.o. male w/ PMHx significant for CAD s/p CABG '90 & multiple PCIs '08, Chronic A.Fib (on pradaxa), DM, and CKD  who presented to [facility] on 03/29/12 with complaints of chest pain.  1. Chest Pain: Significant h/o CAD with last cath/intervention 2008. Presented with 2 episodes of dull chest pain that lasted about , w/o associated sx, and resolved spontaneously. Cardiac enzymes normal x3, EKG nonacute. No pleuritic component, tachycardia, hypoxia, or tachypnea. No signs of infection. No objective evidence of ischemia. He has ruled out and given CKD and chronic anticoagulation would not be good candidate for diagnostic cath. Will undergo stress myoview this morning. Further  plans pending results. Cont ASA, statin, ACEI.  2. Chronic Atrial Fibrillation: Maintaining NSR. Cont pradaxa  3. Hypertension: SBPs 130-140s. Cont antihypertensives.  4. Hyperlipidemia: LDL 48. LFTs WNL. Cont statin  5. Diabetes Mellitus: A1c pending. Cont glipizide and SSI.  6. Stage III chronic kidney disease: Baseline Crt ~1.4. Crt 1.26 this am.  7. Back pain: He is on prednisone therapy for this   Disposition: Pending results of stress test.    Signed, HOPE, JESSICA PA-C  Patient seen and agree with the note of [facility].  Patient has had virtually no symptoms since admission.  He is not very excited about the prospects for cath.  He has a recent new mattress and has had some back pain.  Patient also has been going to rehab with basically no symptoms.  Therefore, will assess after nuclear scan.  Patient would like to go home.    s

## 2012-03-31 NOTE — ED Provider Notes (Signed)
Medical screening examination/treatment/procedure(s) were conducted as a shared visit with non-physician practitioner(s) and myself.  I personally evaluated the patient during the encounter  Toy Baker, MD 03/31/12 [phone]

## 2012-04-03 ENCOUNTER — Other Ambulatory Visit: Payer: Self-pay | Admitting: Cardiology

## 2012-04-06 ENCOUNTER — Telehealth: Payer: Self-pay | Admitting: Neurology

## 2012-04-06 NOTE — Telephone Encounter (Signed)
Spoke with the patient's son, Shawn [name]. States his dad stopped the Aricept as per Dr. Modesto Charon for one month. Appetite improved and nausea was better. He just found out that his dad has started taking the Aricept again at the full dose; started back July 1st. Having some of the same issues again; poor appetite and nausea comes and goes. Shawn [name] is wanting to know what to do at this point. Exelon patch was mentioned but he says that his dad would not remember to change it and would do better with a pill of some kind. Perhaps decreasing the Aricept to 5 mg daily would help. I told him that I would check with Dr. Modesto Charon and see what he recommended and get back with him. He is ok with this plan. Shawn [name] has a follow up scheduled for 05/28/12. **Dr. Modesto Charon, please advise.

## 2012-04-06 NOTE — Telephone Encounter (Signed)
Pt's son called for help with father's rx. During his previous visit, Dr. Modesto Charon advised pt to stop taking Aricept to see if that helped with pt's "situation." Pt went off meds for 1 month. Began taking Aricept again on July 1. He is currently taking full dose as prescribed. Is this okay? Does he need to tritrate? Pt's son was unable to say whether or not going off of Aricept helped, or if being on it was helping either. Please call son back and advise.

## 2012-04-07 ENCOUNTER — Other Ambulatory Visit: Payer: Self-pay | Admitting: Neurology

## 2012-04-07 MED ORDER — MEMANTINE HCL 5 MG PO TABS: ORAL_TABLET | ORAL | Status: DC

## 2012-04-07 NOTE — Telephone Encounter (Signed)
Have Mr. Gelpi stop the Aricept and then after feeling better, start Namenda 5mg  once a day to increase to 5mg  bid after 1 week.

## 2012-04-07 NOTE — Telephone Encounter (Signed)
Spoke with the patient. Instructed to stop Aricept and wait a week and then start Namenda and take as directed. Patient states he understands. Asked him to have Les call me if questions. He states he will.

## 2012-04-10 ENCOUNTER — Emergency Department (HOSPITAL_COMMUNITY): Payer: Medicare Other

## 2012-04-10 ENCOUNTER — Telehealth: Payer: Self-pay | Admitting: Gastroenterology

## 2012-04-10 ENCOUNTER — Emergency Department (HOSPITAL_COMMUNITY)
Admission: EM | Admit: 2012-04-10 | Discharge: 2012-04-10 | Disposition: A | Discharge status: Home / Self Care | Payer: Medicare Other | Attending: Emergency Medicine | Admitting: Emergency Medicine

## 2012-04-10 ENCOUNTER — Encounter (HOSPITAL_COMMUNITY): Payer: Self-pay

## 2012-04-10 DIAGNOSIS — Z8546 Personal history of malignant neoplasm of prostate: Secondary | ICD-10-CM | POA: Insufficient documentation

## 2012-04-10 DIAGNOSIS — Z87891 Personal history of nicotine dependence: Secondary | ICD-10-CM | POA: Insufficient documentation

## 2012-04-10 DIAGNOSIS — E7800 Pure hypercholesterolemia, unspecified: Secondary | ICD-10-CM | POA: Insufficient documentation

## 2012-04-10 DIAGNOSIS — N183 Chronic kidney disease, stage 3 unspecified: Secondary | ICD-10-CM | POA: Insufficient documentation

## 2012-04-10 DIAGNOSIS — I2510 Atherosclerotic heart disease of native coronary artery without angina pectoris: Secondary | ICD-10-CM | POA: Insufficient documentation

## 2012-04-10 DIAGNOSIS — I129 Hypertensive chronic kidney disease with stage 1 through stage 4 chronic kidney disease, or unspecified chronic kidney disease: Secondary | ICD-10-CM | POA: Insufficient documentation

## 2012-04-10 DIAGNOSIS — R110 Nausea: Secondary | ICD-10-CM

## 2012-04-10 DIAGNOSIS — R109 Unspecified abdominal pain: Secondary | ICD-10-CM | POA: Insufficient documentation

## 2012-04-10 DIAGNOSIS — I4891 Unspecified atrial fibrillation: Secondary | ICD-10-CM | POA: Insufficient documentation

## 2012-04-10 DIAGNOSIS — E119 Type 2 diabetes mellitus without complications: Secondary | ICD-10-CM | POA: Insufficient documentation

## 2012-04-10 LAB — CBC WITH DIFFERENTIAL/PLATELET
Eosinophils Absolute: 0.3 10*3/uL (ref 0.0–0.7)
Eosinophils Relative: 4 % (ref 0–5)
HCT: 38.4 % — ABNORMAL LOW (ref 39.0–52.0)
Hemoglobin: 12.9 g/dL — ABNORMAL LOW (ref 13.0–17.0)
Lymphocytes Relative: 25 % (ref 12–46)
Lymphs Abs: 2 10*3/uL (ref 0.7–4.0)
MCH: 28.2 pg (ref 26.0–34.0)
MCV: 84 fL (ref 78.0–100.0)
Monocytes Relative: 9 % (ref 3–12)
Platelets: 193 10*3/uL (ref 150–400)
RBC: 4.57 MIL/uL (ref 4.22–5.81)
WBC: 8.1 10*3/uL (ref 4.0–10.5)

## 2012-04-10 LAB — URINALYSIS, ROUTINE W REFLEX MICROSCOPIC
Leukocytes, UA: NEGATIVE
Nitrite: NEGATIVE
Specific Gravity, Urine: 1.007 (ref 1.005–1.030)
Urobilinogen, UA: 1 mg/dL (ref 0.0–1.0)
pH: 7 (ref 5.0–8.0)

## 2012-04-10 LAB — COMPREHENSIVE METABOLIC PANEL
ALT: 14 U/L (ref 0–53)
Alkaline Phosphatase: 67 U/L (ref 39–117)
BUN: 16 mg/dL (ref 6–23)
CO2: 23 mEq/L (ref 19–32)
Calcium: 8.6 mg/dL (ref 8.4–10.5)
GFR calc Af Amer: 64 mL/min — ABNORMAL LOW (ref >90)
GFR calc non Af Amer: 56 mL/min — ABNORMAL LOW (ref >90)
Glucose, Bld: 110 mg/dL — ABNORMAL HIGH (ref 70–99)
Potassium: 4.3 mEq/L (ref 3.5–5.1)
Sodium: 139 mEq/L (ref 135–145)

## 2012-04-10 LAB — LIPASE, BLOOD: Lipase: 30 U/L (ref 11–59)

## 2012-04-10 MED ORDER — FAMOTIDINE IN NACL 20-0.9 MG/50ML-% IV SOLN
20.0000 mg | Freq: Once | INTRAVENOUS | Status: AC
  Administered 2012-04-10: 20 mg via INTRAVENOUS
  Filled 2012-04-10: qty 50

## 2012-04-10 MED ORDER — ONDANSETRON HCL 4 MG PO TABS: 4.0000 mg | ORAL_TABLET | Freq: Three times a day (TID) | ORAL | Status: AC | PRN

## 2012-04-10 MED ORDER — IOHEXOL 300 MG/ML  SOLN
100.0000 mL | Freq: Once | INTRAMUSCULAR | Status: AC | PRN
  Administered 2012-04-10: 100 mL via INTRAVENOUS

## 2012-04-10 MED ORDER — IOHEXOL 300 MG/ML  SOLN
20.0000 mL | INTRAMUSCULAR | Status: AC
  Administered 2012-04-10 (×2): 20 mL via ORAL

## 2012-04-10 MED ORDER — SODIUM CHLORIDE 0.9 % IV SOLN
INTRAVENOUS | Status: DC
  Administered 2012-04-10: 11:00:00 via INTRAVENOUS

## 2012-04-10 MED ORDER — ONDANSETRON HCL 4 MG/2ML IJ SOLN
4.0000 mg | INTRAMUSCULAR | Status: AC | PRN
  Administered 2012-04-10 (×2): 4 mg via INTRAVENOUS
  Filled 2012-04-10 (×2): qty 2

## 2012-04-10 NOTE — ED Notes (Signed)
Pt changed into gown upon arrival to ED.

## 2012-04-10 NOTE — ED Notes (Signed)
Ongoing abd pain and lack of appetite for 3-4 weeks,

## 2012-04-10 NOTE — ED Provider Notes (Signed)
History     CSN: [id number]  Arrival date & time 04/10/12  4098   First MD Initiated Contact with Patient 04/10/12 [phone]      Chief Complaint  Patient presents with  . Abdominal Pain    HPI Pt was seen at 0930.  Per pt and family, c/o gradual onset and persistence of constant vague generalized abd "pain" for the past 1 month.  Has been associated with nausea and decreased appetite.  Symptoms began after he started taking aricept.  Pt has been eval by his PMD for same, was told to stop the aricept (he had this same reaction to aricept previously), and instructed that he "might need to see a GI doctor."  Pt, however, has continued to take his Aricept.  Denies vomiting/diarrhea, no back pain, no CP/SOB, no cough, no fevers.    Past Medical History  Diagnosis Date  . CAD (coronary artery disease)     a. 1990 CABGx3: LIMA->LAD, VG->OM, VG->dRCA;  b. 05/2007 VF Arrest/Cath/PCI: 100 VG->OM tx w/ BMS, 95 VG->RCA tx w/ bms;  c. 06/2007 90 Native PDA tx w DES ;  d. 05/2009 Echo EF 60-65%, nl WM, mild MR; e. normal lexiscan myoview 03/30/12  . HTN (hypertension)   . Hypercholesteremia   . Diabetes mellitus   . Atrial fibrillation     a. had been on tikosyn -> d/c 09/2010;  b. Was on Amio ->d/c 09/2011;  c.  anticoagulated w/ Pradaxa  . Junctional ectopic tachycardia     a. noted 09/2010  . Sinus bradycardia   . CKD (chronic kidney disease), stage III   . Back pain     a. Followed by [facility] - on prednisone  . Prostate cancer   . Unsteady gait     a. in rehab for core strengthening    Past Surgical History  Procedure Date  . Coronary artery bypass graft 1990  . Prostate surgery     Family History  Problem Relation Age of Onset  . Other Father     died of unknown cause @ young age  . Coronary artery disease Mother     died in late 39's w/ "hardening of the arteries"    History  Substance Use Topics  . Smoking status: Former Smoker    Quit date: 09/23/1988  . Smokeless tobacco: Never  Used   Comment: quit 20+ years ago  . Alcohol Use: No    Review of Systems ROS: Statement: All systems negative except as marked or noted in the HPI; Constitutional: Negative for fever and chills. ; ; Eyes: Negative for eye pain, redness and discharge. ; ; ENMT: Negative for ear pain, hoarseness, nasal congestion, sinus pressure and sore throat. ; ; Cardiovascular: Negative for chest pain, palpitations, diaphoresis, dyspnea and peripheral edema. ; ; Respiratory: Negative for cough, wheezing and stridor. ; ; Gastrointestinal: +nausea, abd pain, decreased appetite. Negative for vomiting, diarrhea, blood in stool, hematemesis, jaundice and rectal bleeding. . ; ; Genitourinary: Negative for dysuria, flank pain and hematuria. ; ; Musculoskeletal: Negative for back pain and neck pain. Negative for swelling and trauma.; ; Skin: Negative for pruritus, rash, abrasions, blisters, bruising and skin lesion.; ; Neuro: Negative for headache, lightheadedness and neck stiffness. Negative for weakness, altered level of consciousness , altered mental status, extremity weakness, paresthesias, involuntary movement, seizure and syncope.     Allergies  Review of patient's allergies indicates no known allergies.  Home Medications   Current Outpatient Rx  Name Route Sig  Dispense Refill  . ASPIRIN 81 MG PO TABS Oral Take 81 mg by mouth. Every other day    . CRESTOR 10 MG PO TABS  take 1 tablet by mouth once daily 90 tablet 3  . FINASTERIDE 5 MG PO TABS Oral Take 1 tablet by mouth Daily.    . FUROSEMIDE 20 MG PO TABS  take 1 tablet by mouth once daily 30 tablet 8  . GLIPIZIDE 10 MG PO TABS Oral Take 10 mg by mouth daily.      Marland Kitchen KLOR-CON M10 10 MEQ PO TBCR  take 1 tablet by mouth once daily 30 tablet 2  . LEVOTHYROXINE SODIUM 75 MCG PO TABS Oral Take 75 mcg by mouth daily.    Marland Kitchen LISINOPRIL 10 MG PO TABS  take 1 tablet by mouth once daily 30 tablet 6  . ONE-DAILY MULTI VITAMINS PO TABS Oral Take 1 tablet by mouth daily.       Marland Kitchen PRADAXA 150 MG PO CAPS  take 1 capsule by mouth twice a day 60 capsule 6  . SERTRALINE HCL 100 MG PO TABS Oral Take 100 mg by mouth daily.    Marland Kitchen TAMSULOSIN HCL 0.4 MG PO CAPS Oral Take 0.4 mg by mouth daily after supper.    Marland Kitchen TEMAZEPAM 15 MG PO CAPS Oral Take 15 mg by mouth at bedtime.    Marland Kitchen NITROSTAT 0.4 MG SL SUBL  PLACE 1 TABLET UNDER THE TONGUE AS DIRECTED IF NEEDED EVERY 5 MINUTES UP TO 3 DOSES IF NEEDED FOR CHEST PAIN 25 tablet 1    BP 182/64  Pulse 63  Temp 97.5 F (36.4 C) (Oral)  Resp 18  SpO2 98%  Physical Exam 0935: Physical examination:  Nursing notes reviewed; Vital signs and O2 SAT reviewed;  Constitutional: Well developed, Well nourished, Well hydrated, In no acute distress; Head:  Normocephalic, atraumatic; Eyes: EOMI, PERRL, No scleral icterus; ENMT: Mouth and pharynx normal, Mucous membranes moist; Neck: Supple, Full range of motion, No lymphadenopathy; Cardiovascular: Regular rate and rhythm, No gallop; Respiratory: Breath sounds clear & equal bilaterally, No wheezes.  Speaking full sentences with ease, Normal respiratory effort/excursion; Chest: Nontender, Movement normal; Abdomen: Soft, Nontender, Nondistended, Normal bowel sounds;; Extremities: Pulses normal, No tenderness, No edema, No calf edema or asymmetry.; Neuro: AA&Ox3, Major CN grossly intact.  Speech clear. No gross focal motor or sensory deficits in extremities.; Skin: Color normal, Warm, Dry.   ED Course  Procedures   MDM  MDM Reviewed: nursing note, vitals and previous chart Reviewed previous: ECG and labs Interpretation: labs, ECG, x-ray and CT scan     Nm Myocar Multi W/spect W/wall Motion / Ef 03/30/2012  This patient underwent Lexiscan Myoview stress test under supervision of [facility] staff.  Patient did not develop any chest pain or EKG changes with stress.  Quality of the images was satisfactory for interpretation.  Perfusion imaging shows no ischemia or infarction.  The End  Diastolic Volume is 90 ml.  The End Systolic Volume is 43 ml.   The Ejection Fraction is 53%. There are no segmental wall motion abnormalities.  Impression: Normal Lexiscan Myoview stress test.  Maisie Fus A. Brackbill MD  Original Report Authenticated By: Heriberto Antigua       Date: 04/10/2012  Rate: 66  Rhythm: normal sinus rhythm  QRS Axis: normal  Intervals: normal  ST/T Wave abnormalities: nonspecific T wave changes  Conduction Disutrbances:none  Narrative Interpretation:   Old EKG Reviewed: unchanged; no significant changes from previous EKG dated  03/30/2012.   Results for orders placed during the hospital encounter of 04/10/12  CBC WITH DIFFERENTIAL      Component Value Range   WBC 8.1  4.0 - 10.5 K/uL   RBC 4.57  4.22 - 5.81 MIL/uL   Hemoglobin 12.9 (*) 13.0 - 17.0 g/dL   HCT 16.1 (*) 09.6 - 04.5 %   MCV 84.0  78.0 - 100.0 fL   MCH 28.2  26.0 - 34.0 pg   MCHC 33.6  30.0 - 36.0 g/dL   RDW 40.9  81.1 - 91.4 %   Platelets 193  150 - 400 K/uL   Neutrophils Relative 62  43 - 77 %   Neutro Abs 5.0  1.7 - 7.7 K/uL   Lymphocytes Relative 25  12 - 46 %   Lymphs Abs 2.0  0.7 - 4.0 K/uL   Monocytes Relative 9  3 - 12 %   Monocytes Absolute 0.7  0.1 - 1.0 K/uL   Eosinophils Relative 4  0 - 5 %   Eosinophils Absolute 0.3  0.0 - 0.7 K/uL   Basophils Relative 1  0 - 1 %   Basophils Absolute 0.0  0.0 - 0.1 K/uL  COMPREHENSIVE METABOLIC PANEL      Component Value Range   Sodium 139  135 - 145 mEq/L   Potassium 4.3  3.5 - 5.1 mEq/L   Chloride 106  96 - 112 mEq/L   CO2 23  19 - 32 mEq/L   Glucose, Bld 110 (*) 70 - 99 mg/dL   BUN 16  6 - 23 mg/dL   Creatinine, Ser 7.82  0.50 - 1.35 mg/dL   Calcium 8.6  8.4 - 95.6 mg/dL   Total Protein 6.2  6.0 - 8.3 g/dL   Albumin 3.1 (*) 3.5 - 5.2 g/dL   AST 18  0 - 37 U/L   ALT 14  0 - 53 U/L   Alkaline Phosphatase 67  39 - 117 U/L   Total Bilirubin 0.4  0.3 - 1.2 mg/dL   GFR calc non Af Amer 56 (*) >90 mL/min   GFR calc Af Amer 64 (*) >90 mL/min    LIPASE, BLOOD      Component Value Range   Lipase 30  11 - 59 U/L  TROPONIN I      Component Value Range   Troponin I <0.30  <0.30 ng/mL  URINALYSIS, ROUTINE W REFLEX MICROSCOPIC      Component Value Range   Color, Urine YELLOW  YELLOW   APPearance CLEAR  CLEAR   Specific Gravity, Urine 1.007  1.005 - 1.030   pH 7.0  5.0 - 8.0   Glucose, UA NEGATIVE  NEGATIVE mg/dL   Hgb urine dipstick NEGATIVE  NEGATIVE   Bilirubin Urine NEGATIVE  NEGATIVE   Ketones, ur NEGATIVE  NEGATIVE mg/dL   Protein, ur NEGATIVE  NEGATIVE mg/dL   Urobilinogen, UA 1.0  0.0 - 1.0 mg/dL   Nitrite NEGATIVE  NEGATIVE   Leukocytes, UA NEGATIVE  NEGATIVE   Dg Chest 2 View 04/10/2012  *RADIOLOGY REPORT*  Clinical Data: Loss of appetite.  Nausea.  Cough.  CHEST - 2 VIEW  Comparison: 03/29/2012 and multiple previous  Findings: There has been previous median sternotomy and CABG. There is no evidence of heart failure or effusion.  Bilateral nipple shadows are seen.  No infiltrate, collapse or effusion.  6 mm right suprahilar nodule is unchanged from multiple previous exams, consistent with a benign granuloma.  No significant  bony finding.  IMPRESSION: Previous CABG.  No active cardiopulmonary disease.  Original Report Authenticated By: Thomasenia Sales, M.D.   Ct Abdomen Pelvis W Contrast 04/10/2012  *RADIOLOGY REPORT*  Clinical Data:  Generalized abdominal pain, nausea  CT ABDOMEN AND PELVIS WITH CONTRAST  Technique:  Multidetector CT imaging of the abdomen and pelvis was performed following the standard protocol during bolus administration of intravenous contrast.  Contrast: OMNIPAQUE IOHEXOL 300 MG/ML  SOLN  Comparison: CT scan of the abdomen 03/04/2007  Findings:  There is a 1.5 cm semisolid pulmonary nodule in the posterior aspect of the right lower lobe associated with some linear scarring versus atelectasis.  This lesion is best seen on series 3, image 4. Dependent atelectasis is noted in the left lower lobe.  The  visualized portion of the heart is within normal limits for size. There is no pericardial effusion.  Small hiatal hernia.  The spleen, adrenal glands, liver and pancreas are within normal limits.  High attenuation material layering within the gallbladder neck likely reflects cholelithiasis.  There is no gallbladder wall thickening, pericholecystic fluid or biliary ductal dilatation.  Mild nonspecific perirenal stranding bilaterally.  No hydronephrosis, and no focal solid renal lesion on either side. There is mild parenchymal thinning of the anterior aspect of the left lower pole.  Sigmoid colonic diverticular disease without evidence of active inflammation.  No evidence of bowel obstruction. The patient is status post prostatectomy.  The bladder is unremarkable.  Diffuse atherosclerotic vascular disease involving the infrarenal abdominal aorta and iliac vessels. 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  No acute fracture or aggressive appearing lytic or blastic osseous lesion.  There is mild osteoarthritis of the bilateral hip joints as well as a multilevel degenerative disc disease and lower lumbar facet arthropathy.  IMPRESSION: 1.  No acute intra-abdominal findings to explain the patient's clinical symptoms of generalized abdominal pain and nausea. 2.  A 1.5 cm semisolid pulmonary nodule associated with a region of linear atelectasis versus scarring in the posterior right lower lobe is concerning for a primary bronchogenic carcinoma. Recommend further evaluation with PET CT, or percutaneous CT guided biopsy. 3.  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  Cholelithiasis without radiographic evidence of acute cholecystitis. 5.  Sigmoid diverticular disease without evidence of acute inflammation.  Critical Value/emergent results were called by telephone at the time of interpretation on 04/10/2012 at  01:37 p.m. to Dr. Samuel Jester, who verbally acknowledged these results.  Original Report Authenticated By: Vilma Prader     2:57 PM:   Pt has been ambulatory in the ED with steady gait, easy resps.  VS remain stable, not orthostatic.  Has tol PO well while in the ED without N/V.  Has recently had admission 10 days ago for CP with negative NM stress test (as above). T/C to Dr. Eloise Harman (on call for Dr. Jacky Kindle), case discussed, including:  HPI, pertinent PM/SHx, VS/PE, dx testing, ED course and treatment:  requests to d/c pt and they will f/u CT findings in office.  Dx testing, including CT findings above, as well as D/W Dr. Eloise Harman, d/w pt and family.  Questions answered.  Verb understanding, agreeable to d/c home with outpt f/u.        Laray Anger, DO 04/13/12 1419

## 2012-04-10 NOTE — ED Notes (Signed)
Went in to see pt. , he is at x-ray

## 2012-04-11 LAB — URINE CULTURE

## 2012-04-13 ENCOUNTER — Telehealth: Payer: Self-pay | Admitting: Gastroenterology

## 2012-04-13 ENCOUNTER — Ambulatory Visit: Payer: Medicare Other | Admitting: Physician Assistant

## 2012-04-13 ENCOUNTER — Emergency Department (HOSPITAL_COMMUNITY)
Admission: EM | Admit: 2012-04-13 | Discharge: 2012-04-13 | Disposition: A | Discharge status: Home / Self Care | Payer: Medicare Other | Attending: Emergency Medicine | Admitting: Emergency Medicine

## 2012-04-13 ENCOUNTER — Ambulatory Visit: Payer: Medicare Other | Admitting: Cardiology

## 2012-04-13 ENCOUNTER — Encounter (HOSPITAL_COMMUNITY): Payer: Self-pay | Admitting: *Deleted

## 2012-04-13 DIAGNOSIS — E860 Dehydration: Secondary | ICD-10-CM | POA: Insufficient documentation

## 2012-04-13 DIAGNOSIS — Z79899 Other long term (current) drug therapy: Secondary | ICD-10-CM | POA: Insufficient documentation

## 2012-04-13 DIAGNOSIS — I129 Hypertensive chronic kidney disease with stage 1 through stage 4 chronic kidney disease, or unspecified chronic kidney disease: Secondary | ICD-10-CM | POA: Insufficient documentation

## 2012-04-13 DIAGNOSIS — R109 Unspecified abdominal pain: Secondary | ICD-10-CM | POA: Insufficient documentation

## 2012-04-13 DIAGNOSIS — N183 Chronic kidney disease, stage 3 unspecified: Secondary | ICD-10-CM | POA: Insufficient documentation

## 2012-04-13 DIAGNOSIS — E119 Type 2 diabetes mellitus without complications: Secondary | ICD-10-CM | POA: Insufficient documentation

## 2012-04-13 DIAGNOSIS — R630 Anorexia: Secondary | ICD-10-CM | POA: Insufficient documentation

## 2012-04-13 DIAGNOSIS — Z951 Presence of aortocoronary bypass graft: Secondary | ICD-10-CM | POA: Insufficient documentation

## 2012-04-13 DIAGNOSIS — R110 Nausea: Secondary | ICD-10-CM | POA: Insufficient documentation

## 2012-04-13 DIAGNOSIS — E7800 Pure hypercholesterolemia, unspecified: Secondary | ICD-10-CM | POA: Insufficient documentation

## 2012-04-13 DIAGNOSIS — I4891 Unspecified atrial fibrillation: Secondary | ICD-10-CM | POA: Insufficient documentation

## 2012-04-13 DIAGNOSIS — Z7982 Long term (current) use of aspirin: Secondary | ICD-10-CM | POA: Insufficient documentation

## 2012-04-13 DIAGNOSIS — I2510 Atherosclerotic heart disease of native coronary artery without angina pectoris: Secondary | ICD-10-CM | POA: Insufficient documentation

## 2012-04-13 HISTORY — DX: Noninfective gastroenteritis and colitis, unspecified: K52.9

## 2012-04-13 HISTORY — DX: Benign neoplasm of colon, unspecified: D12.6

## 2012-04-13 LAB — COMPREHENSIVE METABOLIC PANEL
ALT: 14 U/L (ref 0–53)
AST: 16 U/L (ref 0–37)
Albumin: 3.1 g/dL — ABNORMAL LOW (ref 3.5–5.2)
Alkaline Phosphatase: 69 U/L (ref 39–117)
Potassium: 4 mEq/L (ref 3.5–5.1)
Sodium: 136 mEq/L (ref 135–145)
Total Protein: 6.1 g/dL (ref 6.0–8.3)

## 2012-04-13 LAB — URINALYSIS, ROUTINE W REFLEX MICROSCOPIC
Bilirubin Urine: NEGATIVE
Glucose, UA: NEGATIVE mg/dL
Hgb urine dipstick: NEGATIVE
Ketones, ur: NEGATIVE mg/dL
Leukocytes, UA: NEGATIVE
pH: 8 (ref 5.0–8.0)

## 2012-04-13 MED ORDER — PANTOPRAZOLE SODIUM 40 MG IV SOLR
40.0000 mg | Freq: Once | INTRAVENOUS | Status: AC
  Administered 2012-04-13: 40 mg via INTRAVENOUS

## 2012-04-13 MED ORDER — ONDANSETRON HCL 4 MG/2ML IJ SOLN
4.0000 mg | Freq: Once | INTRAMUSCULAR | Status: AC
  Administered 2012-04-13: 4 mg via INTRAVENOUS

## 2012-04-13 MED ORDER — SODIUM CHLORIDE 0.9 % IV SOLN: INTRAVENOUS | Status: DC

## 2012-04-13 MED ORDER — SODIUM CHLORIDE 0.9 % IV BOLUS (SEPSIS)
500.0000 mL | Freq: Once | INTRAVENOUS | Status: AC
  Administered 2012-04-13: 08:00:00 via INTRAVENOUS

## 2012-04-13 NOTE — Telephone Encounter (Signed)
Left message that pt could not be seen this afternoon due to no available appts. Pt instructed to call the office back and reschedule the appt.

## 2012-04-13 NOTE — Telephone Encounter (Signed)
Pt scheduled to see Dr. Russella Dar 04/14/12, please see additional phone note.

## 2012-04-13 NOTE — ED Notes (Signed)
Pt states that he was seen here last Friday for same abd pain still having nausea  ( is taking meds for iit)but no vomiting or diarrhea states not eating and feels he needs an IV Has 8 am appointment w/ [facility] but pt states he cannot make it

## 2012-04-13 NOTE — ED Provider Notes (Signed)
History     CSN: [id number]  Arrival date & time 04/13/12  0707   First MD Initiated Contact with Patient 04/13/12 [phone]      Chief Complaint  Patient presents with  . Abdominal Pain  . Nausea    (Consider location/radiation/quality/duration/timing/severity/associated sxs/prior treatment) HPI Comments: NICKIE DEREN is a 78 y.o. male who is here for anorexia, and nausea.  He does not have vomiting, or abdominal pain.  He was evaluated in the emergency department 04/10/12; at that time a comprehensive evaluation did not show serious acute problems.  He was referred to his PCP, who arranged for GI evaluation.  He had a GI appointment today, at 8:30, but decided to come to the emergency department for IV fluids, which is what he thinks will help.  The patient denies fever, chills, back pain, or change in bowel or urinary habits.  He has had a mild nonproductive cough.  He feels somewhat dizzy with ambulation.  He is using his regular medicines, which include Zofran, without relief.   Patient is a 78 y.o. male presenting with abdominal pain. The history is provided by the patient.  Abdominal Pain The primary symptoms of the illness include abdominal pain.    Past Medical History  Diagnosis Date  . CAD (coronary artery disease)     a. 1990 CABGx3: LIMA->LAD, VG->OM, VG->dRCA;  b. 05/2007 VF Arrest/Cath/PCI: 100 VG->OM tx w/ BMS, 95 VG->RCA tx w/ bms;  c. 06/2007 90 Native PDA tx w DES ;  d. 05/2009 Echo EF 60-65%, nl WM, mild MR; e. normal lexiscan myoview 03/30/12  . HTN (hypertension)   . Hypercholesteremia   . Diabetes mellitus   . Atrial fibrillation     a. had been on tikosyn -> d/c 09/2010;  b. Was on Amio ->d/c 09/2011;  c.  anticoagulated w/ Pradaxa  . Junctional ectopic tachycardia     a. noted 09/2010  . Sinus bradycardia   . CKD (chronic kidney disease), stage III   . Back pain     a. Followed by [facility] - on prednisone  . Prostate cancer   . Unsteady gait     a. in rehab for core  strengthening    Past Surgical History  Procedure Date  . Coronary artery bypass graft 1990  . Prostate surgery     Family History  Problem Relation Age of Onset  . Other Father     died of unknown cause @ young age  . Coronary artery disease Mother     died in late 55's w/ "hardening of the arteries"    History  Substance Use Topics  . Smoking status: Former Smoker    Quit date: 09/23/1988  . Smokeless tobacco: Never Used   Comment: quit 20+ years ago  . Alcohol Use: No      Review of Systems  Gastrointestinal: Positive for abdominal pain.  All other systems reviewed and are negative.    Allergies  Review of patient's allergies indicates no known allergies.  Home Medications   Current Outpatient Rx  Name Route Sig Dispense Refill  . ASPIRIN 81 MG PO TABS Oral Take 81 mg by mouth. Every other day    . FINASTERIDE 5 MG PO TABS Oral Take 1 tablet by mouth Daily.    . FUROSEMIDE 20 MG PO TABS  take 1 tablet by mouth once daily 30 tablet 8  . GLIPIZIDE 10 MG PO TABS Oral Take 10 mg by mouth daily.      Marland Kitchen  KLOR-CON M10 10 MEQ PO TBCR  take 1 tablet by mouth once daily 30 tablet 2  . LEVOTHYROXINE SODIUM 75 MCG PO TABS Oral Take 75 mcg by mouth daily.    Marland Kitchen LISINOPRIL 10 MG PO TABS  take 1 tablet by mouth once daily 30 tablet 6  . ONE-DAILY MULTI VITAMINS PO TABS Oral Take 1 tablet by mouth daily.      Marland Kitchen ONDANSETRON HCL 4 MG PO TABS Oral Take 1 tablet (4 mg total) by mouth every 8 (eight) hours as needed for nausea. 8 tablet 0  . PRADAXA 150 MG PO CAPS  take 1 capsule by mouth twice a day 60 capsule 6  . ROSUVASTATIN CALCIUM 10 MG PO TABS Oral Take 10 mg by mouth daily.    . SERTRALINE HCL 100 MG PO TABS Oral Take 100 mg by mouth daily.    Marland Kitchen NITROSTAT 0.4 MG SL SUBL  PLACE 1 TABLET UNDER THE TONGUE AS DIRECTED IF NEEDED EVERY 5 MINUTES UP TO 3 DOSES IF NEEDED FOR CHEST PAIN 25 tablet 1    BP 157/62  Pulse 68  Temp 97.7 F (36.5 C) (Oral)  Resp 17  SpO2  97%  Physical Exam  Nursing note and vitals reviewed. Constitutional: He is oriented to person, place, and time. He appears well-developed and well-nourished. He appears distressed (mild).  HENT:  Head: Normocephalic and atraumatic.  Right Ear: External ear normal.  Left Ear: External ear normal.  Eyes: Conjunctivae and EOM are normal. Pupils are equal, round, and reactive to light.  Neck: Normal range of motion and phonation normal. Neck supple.  Cardiovascular: Normal rate, regular rhythm, normal heart sounds and intact distal pulses.   Pulmonary/Chest: Effort normal and breath sounds normal. He exhibits no bony tenderness.  Abdominal: Soft. Normal appearance. There is no tenderness.  Musculoskeletal: Normal range of motion.  Neurological: He is alert and oriented to person, place, and time. He has normal strength. No cranial nerve deficit or sensory deficit. He exhibits normal muscle tone. Coordination normal.  Skin: Skin is warm, dry and intact.  Psychiatric: His behavior is normal.       Anxious    ED Course  Procedures (including critical care time)  Emergency department treatment IV fluids bolus and drip.  IV Zofran, and Protonix.  Orthostatics- negative  Reevaluation: 11:31 hours- he is ambulatory, in no distress.  He has no further complaints.  He has tolerated oral liquids in the emergency department.  He rescheduled his GI appointment for tomorrow morning.   Labs Reviewed  COMPREHENSIVE METABOLIC PANEL - Abnormal; Notable for the following:    Creatinine, Ser 1.55 (*)     Albumin 3.1 (*)     GFR calc non Af Amer 40 (*)     GFR calc Af Amer 46 (*)     All other components within normal limits  URINALYSIS, ROUTINE W REFLEX MICROSCOPIC      1. Dehydration   2. Anorexia       MDM  Nonspecific anorexia, with mild dehydration, treated in the emergency department.  He is stable for discharge with outpatient management.  He has near term Followup  scheduled.   Plan: Home Medications- usual; Home Treatments- push fluids; Recommended follow up- GI tomorrow     Flint Melter, MD 04/13/12 1133

## 2012-04-13 NOTE — ED Notes (Signed)
Pt reports he is continuing to have nausea and generalized abdominal pain. Denies diarrhea. Pt states he has been here for the same recently. States I haven't been able to eat much and when i do i feel nauseas (pt denies vomiting however).

## 2012-04-13 NOTE — ED Notes (Signed)
Pt ambulated with no assistance and no problems.

## 2012-04-14 ENCOUNTER — Ambulatory Visit (INDEPENDENT_AMBULATORY_CARE_PROVIDER_SITE_OTHER): Payer: Medicare Other | Admitting: Gastroenterology

## 2012-04-14 ENCOUNTER — Encounter: Payer: Self-pay | Admitting: Gastroenterology

## 2012-04-14 VITALS — BP 162/68 | HR 68 | Ht 71.0 in | Wt 222.2 lb

## 2012-04-14 DIAGNOSIS — R110 Nausea: Secondary | ICD-10-CM

## 2012-04-14 DIAGNOSIS — R1013 Epigastric pain: Secondary | ICD-10-CM

## 2012-04-14 DIAGNOSIS — R630 Anorexia: Secondary | ICD-10-CM

## 2012-04-14 MED ORDER — LANSOPRAZOLE 30 MG PO CPDR: 30.0000 mg | DELAYED_RELEASE_CAPSULE | Freq: Every day | ORAL | Status: DC

## 2012-04-14 NOTE — Patient Instructions (Addendum)
You have been scheduled for an endoscopy with propofol. Please follow written instructions given to you at your visit today. If you use inhalers (even only as needed), please bring them with you on the day of your procedure. Do not take your Pradaxa tomorrow, the day of your procedure. Take your Prevacid one tablet by mouth once daily. A prescription has been sent to your pharmacy.  Take your Miralax 17 grams in 8 oz of water once daily.  cc: Geoffry Paradise, MD

## 2012-04-14 NOTE — Progress Notes (Addendum)
History of Present Illness: This is an 78 year old male here today 2 sons. Has a three-week history of of intermittent anorexia, epigastric pain and nausea. His son's supply a great deal of the history. Apparently he has days when he can eat normally and other days when he has epigastric discomfort, nausea and anorexia. He complains of intermittent constipation for the past few weeks and this has been a problem in the past. He was seen in the emergency department and I have reviewed those records. Blood work was unremarkable, except Hb=12.9 and alb=3.1. He underwent colonoscopy in June 2008 which revealed an adenomatous colon polyp, an AVM, diverticulosis and hemorrhoids. He is followed by Dr. Riley Kill with a history of coronary artery disease status post CABG with a history of a myocardial infarction. He is maintained on Pradaxa for atrial fibrillation. Denies weight loss, diarrhea, change in stool caliber, melena, hematochezia, nausea, vomiting, dysphagia, chest pain.  Recent CT scan: 1. No acute intra-abdominal findings to explain the patient's  clinical symptoms of generalized abdominal pain and nausea.  2. A 1.5 cm semisolid pulmonary nodule associated with a region of  linear atelectasis versus scarring in the posterior right lower  lobe is concerning for a primary bronchogenic carcinoma. Recommend  further evaluation with PET CT, or percutaneous CT guided biopsy.  3. Diffuse atherosclerotic vascular disease with aneurysmal  dilatation of the right common iliac artery to 17 mm  4. Cholelithiasis without radiographic evidence of acute  cholecystitis.  5. Sigmoid diverticular disease without evidence of acute  inflammation.  Review of Systems: Pertinent positive and negative review of systems were noted in the above HPI section. All other review of systems were otherwise negative.  Current Medications, Allergies, Past Medical History, Past Surgical History, Family History and Social History were  reviewed in [employer] record.  Physical Exam: General: Well developed , well nourished, no acute distress Head: Normocephalic and atraumatic Eyes:  sclerae anicteric, EOMI Ears: Normal auditory acuity Mouth: No deformity or lesions Neck: Supple, no masses or thyromegaly Lungs: Clear throughout to auscultation Heart: Regular rate and rhythm; no murmurs, rubs or bruits Abdomen: Soft, non tender and non distended. No masses, hepatosplenomegaly or hernias noted. Normal Bowel sounds Musculoskeletal: Symmetrical with no gross deformities  Skin: No lesions on visible extremities Pulses:  Normal pulses noted Extremities: No clubbing, cyanosis, edema or deformities noted Neurological: Alert oriented x 4, memory deficits, otherwise grossly nonfocal Cervical Nodes:  No significant cervical adenopathy Inguinal Nodes: No significant inguinal adenopathy Psychological:  Alert and cooperative. Normal mood and affect  Assessment and Recommendations:  1. Intermittent epigastric discomfort with anorexia and nausea. Rule out ulcer disease, GERD, gastritis and symptomatic cholelithiasis. Schedule a diagnostic upper endoscopy while on Pradaxa. Begin Prevacid 30 mg daily. The risks, benefits, and alternatives to endoscopy with possible biopsy and possible dilation were discussed with the patient and they consent to proceed. If his upper endoscopy is unremarkable proceed with a HIDA scan.  2. Coronary artery disease with a history of myocardial infarction. Recent cardiac nuclear medicine scan was unremarkable.  3. Right lower lobe lesion needs further evaluation from his primary physician. This was specifically addressed with the patient and his sons.  4. Atrial fibrillation maintained on Pradaxa.  5. Mild constipation. Begin daily MiraLax.

## 2012-04-15 ENCOUNTER — Encounter: Payer: Self-pay | Admitting: Gastroenterology

## 2012-04-15 ENCOUNTER — Ambulatory Visit (AMBULATORY_SURGERY_CENTER): Payer: Medicare Other | Admitting: Gastroenterology

## 2012-04-15 ENCOUNTER — Other Ambulatory Visit: Payer: Self-pay | Admitting: Gastroenterology

## 2012-04-15 ENCOUNTER — Telehealth: Payer: Self-pay

## 2012-04-15 VITALS — BP 162/64 | HR 60 | Temp 98.1°F | Resp 14 | Ht 71.0 in | Wt 222.0 lb

## 2012-04-15 DIAGNOSIS — R630 Anorexia: Secondary | ICD-10-CM

## 2012-04-15 DIAGNOSIS — R109 Unspecified abdominal pain: Secondary | ICD-10-CM

## 2012-04-15 DIAGNOSIS — R110 Nausea: Secondary | ICD-10-CM

## 2012-04-15 DIAGNOSIS — R1013 Epigastric pain: Secondary | ICD-10-CM

## 2012-04-15 LAB — GLUCOSE, CAPILLARY
Glucose-Capillary: 73 mg/dL (ref 70–99)
Glucose-Capillary: 101 mg/dL — ABNORMAL HIGH (ref 70–99)
Glucose-Capillary: 169 mg/dL — ABNORMAL HIGH (ref 70–99)

## 2012-04-15 MED ORDER — DEXTROSE 5 % IV SOLN: INTRAVENOUS | Status: DC

## 2012-04-15 MED ORDER — SODIUM CHLORIDE 0.9 % IV SOLN: 500.0000 mL | INTRAVENOUS | Status: DC

## 2012-04-15 NOTE — Progress Notes (Addendum)
Verified with Dr. Russella Dar- ok to restart Pradaxa today  Patient did not experience any of the following events: a burn prior to discharge; a fall within the facility; wrong site/side/patient/procedure/implant event; or a hospital transfer or hospital admission upon discharge from the facility. [phone]) Patient did not have preoperative order for IV antibiotic SSI prophylaxis. [phone])

## 2012-04-15 NOTE — Patient Instructions (Addendum)
YOU HAD AN ENDOSCOPIC PROCEDURE TODAY AT THE [facility]: Refer to the procedure report that was given to you for any specific questions about what was found during the examination.  If the procedure report does not answer your questions, please call your gastroenterologist to clarify.  If you requested that your care partner not be given the details of your procedure findings, then the procedure report has been included in a sealed envelope for you to review at your convenience later.  YOU SHOULD EXPECT: Some feelings of bloating in the abdomen. Passage of more gas than usual.  Walking can help get rid of the air that was put into your GI tract during the procedure and reduce the bloating. If you had a lower endoscopy (such as a colonoscopy or flexible sigmoidoscopy) you may notice spotting of blood in your stool or on the toilet paper. If you underwent a bowel prep for your procedure, then you may not have a normal bowel movement for a few days.  DIET: Your first meal following the procedure should be a light meal and then it is ok to progress to your normal diet.  A half-sandwich or bowl of soup is an example of a good first meal.  Heavy or fried foods are harder to digest and may make you feel nauseous or bloated.  Likewise meals heavy in dairy and vegetables can cause extra gas to form and this can also increase the bloating.  Drink plenty of fluids but you should avoid alcoholic beverages for 24 hours.  ACTIVITY: Your care partner should take you home directly after the procedure.  You should plan to take it easy, moving slowly for the rest of the day.  You can resume normal activity the day after the procedure however you should NOT DRIVE or use heavy machinery for 24 hours (because of the sedation medicines used during the test).    SYMPTOMS TO REPORT IMMEDIATELY: A gastroenterologist can be reached at any hour.  During normal business hours, 8:30 AM to 5:00 PM Monday through Friday,  call [phone].  After hours and on weekends, please call the GI answering service at [phone] who will take a message and have the physician on call contact you.  Following upper endoscopy (EGD)  Vomiting of blood or coffee ground material  New chest pain or pain under the shoulder blades  Painful or persistently difficult swallowing  New shortness of breath  Fever of 100F or higher  Black, tarry-looking stools  FOLLOW UP:.  Our staff will call the home number listed on your records the next business day following your procedure to check on you and address any questions or concerns that you may have at that time regarding the information given to you following your procedure. This is a courtesy call and so if there is no answer at the home number and we have not heard from you through the emergency physician on call, we will assume that you have returned to your regular daily activities without incident.  OK TO RESUME PRADAXA  SIGNATURES/CONFIDENTIALITY: You and/or your care partner have signed paperwork which will be entered into your electronic medical record.  These signatures attest to the fact that that the information above on your After Visit Summary has been reviewed and is understood.  Full responsibility of the confidentiality of this discharge information lies with you and/or your care-partner.   Dr. Ardell Isaacs office will call you to set up HIDA SCAN  Ok to resume  your Pradaxa

## 2012-04-15 NOTE — Op Note (Signed)
[facility] [address] [place], [place]  [address]  ENDOSCOPY PROCEDURE REPORT  PATIENT:  [name], Shawn  MR#:  [id number] BIRTHDATE:  06/13/1928, 84 yrs. old  GENDER:  male ENDOSCOPIST:  Judie Petit T. Russella Dar, MD, [facility]  PROCEDURE DATE:  04/15/2012 PROCEDURE:  EGD, diagnostic 43235 ASA CLASS:  Class III INDICATIONS:  epigastric pain, nausea, anorexia MEDICATIONS:   MAC sedation, administered by CRNA, propofol (Diprivan) 50 mg IV, glycopyrrolate (Robinul) 0.2 mg IV TOPICAL ANESTHETIC:  none DESCRIPTION OF PROCEDURE:   After the risks benefits and alternatives of the procedure were thoroughly explained, informed consent was obtained.  The LB GIF-H180 [id number] endoscope was introduced through the mouth and advanced to the second portion of the duodenum, without limitations.  The instrument was slowly withdrawn as the mucosa was fully examined. <<PROCEDUREIMAGES>> The esophagus and gastroesophageal junction were completely normal in appearance.  The stomach was entered and closely examined. The pylorus, antrum, angularis, and lesser curvature were well visualized, including a retroflexed view of the cardia and fundus. The stomach wall was normally distensable. The scope passed easily through the pylorus into the duodenum.  The duodenal bulb was normal in appearance, as was the postbulbar duodenum. Retroflexed views revealed a small hiatal hernia.  The scope was then withdrawn from the patient and the procedure completed.  COMPLICATIONS:  None  ENDOSCOPIC IMPRESSION: 1) Small hiatal hernia  RECOMMENDATIONS: 1) Anti-reflux regimen long term 2) PPI qam long term 3) Schedule HIDA scan  Daizee Firmin T. Russella Dar, MD, [facility]  CC:  Geoffry Paradise, MD  n. Rosalie DoctorVenita Lick. Makenzy Krist at 04/15/2012 02:17 PM  Rae Halsted, [phone]

## 2012-04-15 NOTE — Telephone Encounter (Signed)
Pt scheduled for HIDA scan at [facility] 04/24/12 arrival time 9:45am for a 10am appt. Pt to be NPO after midnight. Pt aware of appt date and time.

## 2012-04-15 NOTE — Progress Notes (Signed)
Blood sugar rechecked.  CBG now 101.  D5 left hanging for procedure.

## 2012-04-16 ENCOUNTER — Telehealth: Payer: Self-pay | Admitting: *Deleted

## 2012-04-16 ENCOUNTER — Other Ambulatory Visit (HOSPITAL_COMMUNITY): Payer: Self-pay | Admitting: Internal Medicine

## 2012-04-16 DIAGNOSIS — R911 Solitary pulmonary nodule: Secondary | ICD-10-CM

## 2012-04-16 NOTE — Telephone Encounter (Signed)
  Follow up Call-  Call back number 04/15/2012  Post procedure Call Back phone  # [phone]  Permission to leave phone message Yes     Patient questions:  Do you have a fever, pain , or abdominal swelling? no Pain Score  0 *  Have you tolerated food without any problems? yes  Have you been able to return to your normal activities? yes  Do you have any questions about your discharge instructions: Diet   no Medications  no Follow up visit  no  Do you have questions or concerns about your Care? no  Actions: * If pain score is 4 or above: No action needed, pain <4.  Pt. States that he has had ongoing nausea, loss of appetite and some epigastric pain.   Doesn't feel great this am, but not a new problem.   States he feels bad many days. Discussed HIDA scan.  States that this has already been scheduled.  Encouraged to call office if any new problems develop.

## 2012-04-17 ENCOUNTER — Other Ambulatory Visit: Payer: Self-pay | Admitting: Radiology

## 2012-04-17 ENCOUNTER — Telehealth: Payer: Self-pay | Admitting: Gastroenterology

## 2012-04-17 ENCOUNTER — Encounter (HOSPITAL_COMMUNITY)
Admission: RE | Admit: 2012-04-17 | Discharge: 2012-04-17 | Disposition: A | Discharge status: Home / Self Care | Payer: Medicare Other | Source: Ambulatory Visit | Attending: Gastroenterology | Admitting: Gastroenterology

## 2012-04-17 DIAGNOSIS — R109 Unspecified abdominal pain: Secondary | ICD-10-CM | POA: Insufficient documentation

## 2012-04-17 MED ORDER — SINCALIDE 5 MCG IJ SOLR
0.0200 ug/kg | Freq: Once | INTRAMUSCULAR | Status: AC
  Administered 2012-04-17: 5 ug via INTRAVENOUS

## 2012-04-17 MED ORDER — SINCALIDE 5 MCG IJ SOLR
INTRAMUSCULAR | Status: AC
  Filled 2012-04-17: qty 5

## 2012-04-17 MED ORDER — TECHNETIUM TC 99M MEBROFENIN IV KIT
5.0000 | PACK | Freq: Once | INTRAVENOUS | Status: AC | PRN
  Administered 2012-04-17: 5 via INTRAVENOUS

## 2012-04-17 NOTE — Telephone Encounter (Signed)
Dr Juanda Chance, you are doc of the day. Can you please review the Hida Scan so pt does not have to wait all weekend for the results? Thanks.

## 2012-04-17 NOTE — Telephone Encounter (Signed)
Please call pt with results of the HIDA scan with [facility] which shows poorly functioning gall bladder, only 9% EF.This is an abnormal scan. Considering all other factors, Dr Russella Dar will make a decision as to possible cholecystectomy.

## 2012-04-17 NOTE — Telephone Encounter (Signed)
Informed pt's son I will do the best I can. Dr Russella Dar is off and I will ask the Sup[facility] md look at the results; the report is pending now.

## 2012-04-20 ENCOUNTER — Telehealth: Payer: Self-pay | Admitting: *Deleted

## 2012-04-20 ENCOUNTER — Encounter (HOSPITAL_COMMUNITY): Payer: Self-pay | Admitting: Respiratory Therapy

## 2012-04-20 DIAGNOSIS — K828 Other specified diseases of gallbladder: Secondary | ICD-10-CM

## 2012-04-20 MED ORDER — TRAMADOL HCL 50 MG PO TABS: 50.0000 mg | ORAL_TABLET | Freq: Four times a day (QID) | ORAL | Status: DC | PRN

## 2012-04-20 NOTE — Telephone Encounter (Signed)
Ultram 50 mg po qid prn pain, #30 Low fat, light diet

## 2012-04-20 NOTE — Telephone Encounter (Signed)
See other phone notes from 04/20/12

## 2012-04-20 NOTE — Telephone Encounter (Signed)
Patient is scheduled to see Dr. Johna Sheriff at [facility] 10:20 arrival for 10:45 appt.  Son is advised of the appt date and time.

## 2012-04-20 NOTE — Telephone Encounter (Signed)
Son advised that I have sent in rx and will call back with surgical referral when available from [facility].

## 2012-04-20 NOTE — Telephone Encounter (Signed)
Patient and son advised of the results of the HIDA scan.  They are advised that I am waiting to hear back from [facility] on an appt date and time for a surgical referral.  Patient's son reports that the patient is having a great deal of pain since yesterday.  Dr. Russella Dar can we send him in anything for pain? He is advised to eat a very low fat bland diet.

## 2012-04-21 ENCOUNTER — Encounter (INDEPENDENT_AMBULATORY_CARE_PROVIDER_SITE_OTHER): Payer: Self-pay | Admitting: General Surgery

## 2012-04-21 NOTE — Progress Notes (Signed)
Patient son Council Mechanic came into [facility] clinic asking if his father could be put on wait list to be seen sooner due to pain (which Clarisse Gouge did at front desk). Les advised that his father was given pain medication by Dr. Russella Dar that has been working and they told him a refill can be issued if needed. Council Mechanic believes the medication issued was Tramadol. I advised that his father needs to stay away from fried, oily and spicy foods. I also advised that if the pain gets that bad he needs to take his father to the emergency room. Les was under the impression the doctor schedules the surgery, I advised that they are assessed by the surgeon, but the schedulers make the appointment based on what is available. I advised the surgeons do not have access or control over the surgery schedule at the hospitals.

## 2012-04-22 ENCOUNTER — Encounter (HOSPITAL_COMMUNITY): Payer: Self-pay

## 2012-04-22 ENCOUNTER — Other Ambulatory Visit (HOSPITAL_COMMUNITY): Payer: Self-pay | Admitting: Internal Medicine

## 2012-04-22 ENCOUNTER — Ambulatory Visit (HOSPITAL_COMMUNITY)
Admission: RE | Admit: 2012-04-22 | Discharge: 2012-04-22 | Disposition: A | Discharge status: Home / Self Care | Payer: Medicare Other | Source: Ambulatory Visit | Attending: Internal Medicine | Admitting: Internal Medicine

## 2012-04-22 DIAGNOSIS — R911 Solitary pulmonary nodule: Secondary | ICD-10-CM

## 2012-04-22 DIAGNOSIS — R9389 Abnormal findings on diagnostic imaging of other specified body structures: Secondary | ICD-10-CM

## 2012-04-22 DIAGNOSIS — Z538 Procedure and treatment not carried out for other reasons: Secondary | ICD-10-CM | POA: Insufficient documentation

## 2012-04-22 LAB — GLUCOSE, CAPILLARY: Glucose-Capillary: 81 mg/dL (ref 70–99)

## 2012-04-22 LAB — CBC
HCT: 41 % (ref 39.0–52.0)
MCV: 84 fL (ref 78.0–100.0)
RDW: 13.8 % (ref 11.5–15.5)
WBC: 8.8 10*3/uL (ref 4.0–10.5)

## 2012-04-22 MED ORDER — ONDANSETRON HCL 4 MG/2ML IJ SOLN
4.0000 mg | Freq: Once | INTRAMUSCULAR | Status: AC
  Administered 2012-04-22: 4 mg via INTRAVENOUS
  Filled 2012-04-22: qty 2

## 2012-04-22 MED ORDER — SODIUM CHLORIDE 0.9 % IV SOLN
Freq: Once | INTRAVENOUS | Status: AC
  Administered 2012-04-22: 1000 mL via INTRAVENOUS

## 2012-04-22 NOTE — Progress Notes (Signed)
Patient ID: Shawn [name], male   DOB: 1928/07/09, 78 y.o.   MRN: [id number]  I have reviewed the imaging with Dr. Jacky Kindle, Mr. Chaddock, and his two sons. My recommendation is to avoid biopsy at this time, in preference for either a PET/CT or 3 month FU CT. They wish to proceed with a PET/CT. This will be scheduled ASAP.

## 2012-04-22 NOTE — H&P (Signed)
Shawn [name] is an 78 y.o. male.   Chief Complaint: incidental finding of right lower lobe lung lesion. Patient presents today for needle biopsy to establish diagnosis.  Hx of prostate cancer. HPI: Symptomatic gallstones - for operative procedure. Incidental lung lesion finding on CT scan - patient has been off pradaxa for this biopsy x 5 days.   Past Medical History  Diagnosis Date  . CAD (coronary artery disease)     a. 1990 CABGx3: LIMA->LAD, VG->OM, VG->dRCA;  b. 05/2007 VF Arrest/Cath/PCI: 100 VG->OM tx w/ BMS, 95 VG->RCA tx w/ bms;  c. 06/2007 90 Native PDA tx w DES ;  d. 05/2009 Echo EF 60-65%, nl WM, mild MR; e. normal lexiscan myoview 03/30/12  . HTN (hypertension)   . Hypercholesteremia   . Diabetes mellitus   . Atrial fibrillation     a. had been on tikosyn -> d/c 09/2010;  b. Was on Amio ->d/c 09/2011;  c.  anticoagulated w/ Pradaxa  . Junctional ectopic tachycardia     a. noted 09/2010  . Sinus bradycardia   . CKD (chronic kidney disease), stage III   . Back pain     a. Followed by [facility] - on prednisone  . Prostate cancer   . Unsteady gait     a. in rehab for core strengthening  . Colitis   . Tubular adenoma of colon 02/2007  . Myocardial infarction     Past Surgical History  Procedure Date  . Coronary artery bypass graft 1990  . Prostate surgery 01/1992    retropubic prostatectomy  . Cardiac valve surgery   . Coronary angioplasty with stent placement 2008    x 3   . Rt finger trigger a-1 pulley     Family History  Problem Relation Age of Onset  . Other Father     died of unknown cause @ young age  . Coronary artery disease Mother     died in late 29's w/ "hardening of the arteries"   Social History:  reports that he quit smoking about 23 years ago. He has never used smokeless tobacco. He reports that he does not drink alcohol or use illicit drugs.  Allergies: No Known Allergies   Results for orders placed during the hospital encounter of 04/22/12 (from the past  48 hour(s))  GLUCOSE, CAPILLARY     Status: Normal   Collection Time   04/22/12  7:21 AM      Component Value Range Comment   Glucose-Capillary 81  70 - 99 mg/dL   APTT     Status: Normal   Collection Time   04/22/12  7:27 AM      Component Value Range Comment   aPTT 30  24 - 37 seconds   CBC     Status: Normal   Collection Time   04/22/12  7:27 AM      Component Value Range Comment   WBC 8.8  4.0 - 10.5 K/uL    RBC 4.88  4.22 - 5.81 MIL/uL    Hemoglobin 13.7  13.0 - 17.0 g/dL    HCT 40.9  81.1 - 91.4 %    MCV 84.0  78.0 - 100.0 fL    MCH 28.1  26.0 - 34.0 pg    MCHC 33.4  30.0 - 36.0 g/dL    RDW 78.2  95.6 - 21.3 %    Platelets 250  150 - 400 K/uL   PROTIME-INR     Status: Normal   Collection  Time   04/22/12  7:27 AM      Component Value Range Comment   Prothrombin Time 14.6  11.6 - 15.2 seconds    INR 1.12  0.00 - 1.49     Review of Systems  Constitutional: Positive for weight loss and malaise/fatigue. Negative for fever and chills.  Respiratory: Negative for cough, hemoptysis, sputum production and shortness of breath.   Cardiovascular: Positive for leg swelling. Negative for chest pain and palpitations.  Gastrointestinal: Positive for heartburn, nausea and abdominal pain.  Musculoskeletal: Positive for back pain.  Neurological: Positive for dizziness and tingling.  Endo/Heme/Allergies: Bruises/bleeds easily.  Psychiatric/Behavioral: Positive for memory loss.    Blood pressure 153/68, pulse 60, temperature 97.3 F (36.3 C), temperature source Oral, resp. rate 18, height 5\' 11"  (1.803 m), weight 220 lb (99.791 kg), SpO2 95.00%. Physical Exam  Constitutional: He appears well-developed. No distress.  HENT:  Head: Normocephalic.  Cardiovascular: Exam reveals no gallop and no friction rub.   No murmur heard.      Ir ir - distant heart sounds    Respiratory: Effort normal and breath sounds normal. No respiratory distress. He has no wheezes. He has no rales. He exhibits  no tenderness.  GI: Soft. He exhibits no distension. There is tenderness.  Neurological: He is alert.  Skin: Skin is warm and dry.  Psychiatric: He has a normal mood and affect. His behavior is normal.     Assessment/Plan Procedure details discussed with patient and his two sons who are present with him today - both have POA.  Potential complications including but not limited to infection, bleeding, hemo/pneumothorax requiring chest tube placement, inadequate specimen and complications with moderate sedation reviewed with their apparent understanding. Labs WNL to proceed. Will resume pradaxa once biopsy complete and if no bleeding complications arise. Written consent obtained.   CAMPBELL,PAMELA D 04/22/2012, 9:03 AM

## 2012-04-24 ENCOUNTER — Other Ambulatory Visit (HOSPITAL_COMMUNITY): Payer: Medicare Other

## 2012-04-25 NOTE — Discharge Summary (Signed)
Patient seen and situation reviewed.  Patient would like to go home, and I am in agreement.  We will seen him in follow up.

## 2012-04-27 ENCOUNTER — Encounter (HOSPITAL_COMMUNITY): Payer: Self-pay | Admitting: Emergency Medicine

## 2012-04-27 ENCOUNTER — Emergency Department (HOSPITAL_COMMUNITY)
Admission: EM | Admit: 2012-04-27 | Discharge: 2012-04-27 | Disposition: A | Discharge status: Home / Self Care | Payer: Medicare Other | Attending: Emergency Medicine | Admitting: Emergency Medicine

## 2012-04-27 ENCOUNTER — Telehealth: Payer: Self-pay | Admitting: Cardiology

## 2012-04-27 ENCOUNTER — Telehealth (INDEPENDENT_AMBULATORY_CARE_PROVIDER_SITE_OTHER): Payer: Self-pay

## 2012-04-27 ENCOUNTER — Other Ambulatory Visit (HOSPITAL_COMMUNITY): Payer: Medicare Other

## 2012-04-27 DIAGNOSIS — Z954 Presence of other heart-valve replacement: Secondary | ICD-10-CM | POA: Insufficient documentation

## 2012-04-27 DIAGNOSIS — K838 Other specified diseases of biliary tract: Secondary | ICD-10-CM | POA: Insufficient documentation

## 2012-04-27 DIAGNOSIS — Z7982 Long term (current) use of aspirin: Secondary | ICD-10-CM | POA: Insufficient documentation

## 2012-04-27 DIAGNOSIS — N183 Chronic kidney disease, stage 3 unspecified: Secondary | ICD-10-CM | POA: Insufficient documentation

## 2012-04-27 DIAGNOSIS — E7800 Pure hypercholesterolemia, unspecified: Secondary | ICD-10-CM | POA: Insufficient documentation

## 2012-04-27 DIAGNOSIS — I4891 Unspecified atrial fibrillation: Secondary | ICD-10-CM | POA: Insufficient documentation

## 2012-04-27 DIAGNOSIS — I129 Hypertensive chronic kidney disease with stage 1 through stage 4 chronic kidney disease, or unspecified chronic kidney disease: Secondary | ICD-10-CM | POA: Insufficient documentation

## 2012-04-27 DIAGNOSIS — Z951 Presence of aortocoronary bypass graft: Secondary | ICD-10-CM | POA: Insufficient documentation

## 2012-04-27 DIAGNOSIS — Z87891 Personal history of nicotine dependence: Secondary | ICD-10-CM | POA: Insufficient documentation

## 2012-04-27 DIAGNOSIS — K829 Disease of gallbladder, unspecified: Secondary | ICD-10-CM

## 2012-04-27 DIAGNOSIS — I2510 Atherosclerotic heart disease of native coronary artery without angina pectoris: Secondary | ICD-10-CM | POA: Insufficient documentation

## 2012-04-27 DIAGNOSIS — Z8546 Personal history of malignant neoplasm of prostate: Secondary | ICD-10-CM | POA: Insufficient documentation

## 2012-04-27 DIAGNOSIS — Z79899 Other long term (current) drug therapy: Secondary | ICD-10-CM | POA: Insufficient documentation

## 2012-04-27 DIAGNOSIS — E119 Type 2 diabetes mellitus without complications: Secondary | ICD-10-CM | POA: Insufficient documentation

## 2012-04-27 DIAGNOSIS — I252 Old myocardial infarction: Secondary | ICD-10-CM | POA: Insufficient documentation

## 2012-04-27 LAB — CBC WITH DIFFERENTIAL/PLATELET
Basophils Absolute: 0 10*3/uL (ref 0.0–0.1)
Basophils Relative: 0 % (ref 0–1)
Eosinophils Absolute: 0.3 10*3/uL (ref 0.0–0.7)
Eosinophils Relative: 3 % (ref 0–5)
MCH: 28.6 pg (ref 26.0–34.0)
MCHC: 34.3 g/dL (ref 30.0–36.0)
Neutrophils Relative %: 63 % (ref 43–77)
Platelets: 246 10*3/uL (ref 150–400)
RBC: 4.79 MIL/uL (ref 4.22–5.81)
RDW: 13.7 % (ref 11.5–15.5)

## 2012-04-27 LAB — LIPASE, BLOOD: Lipase: 28 U/L (ref 11–59)

## 2012-04-27 LAB — COMPREHENSIVE METABOLIC PANEL
ALT: 13 U/L (ref 0–53)
AST: 19 U/L (ref 0–37)
Alkaline Phosphatase: 74 U/L (ref 39–117)
CO2: 23 mEq/L (ref 19–32)
Chloride: 103 mEq/L (ref 96–112)
Creatinine, Ser: 1.43 mg/dL — ABNORMAL HIGH (ref 0.50–1.35)
GFR calc non Af Amer: 44 mL/min — ABNORMAL LOW (ref >90)
Potassium: 4.1 mEq/L (ref 3.5–5.1)
Total Bilirubin: 0.4 mg/dL (ref 0.3–1.2)

## 2012-04-27 MED ORDER — PROMETHAZINE HCL 12.5 MG PO TABS: 12.5000 mg | ORAL_TABLET | Freq: Four times a day (QID) | ORAL | Status: DC | PRN

## 2012-04-27 MED ORDER — ONDANSETRON HCL 4 MG PO TABS: 4.0000 mg | ORAL_TABLET | Freq: Three times a day (TID) | ORAL | Status: DC | PRN

## 2012-04-27 MED ORDER — HYDROCODONE-ACETAMINOPHEN 5-500 MG PO TABS: 1.0000 | ORAL_TABLET | Freq: Four times a day (QID) | ORAL | Status: DC | PRN

## 2012-04-27 NOTE — Telephone Encounter (Signed)
Dr. Riley Kill called Saturday and asked him to come in at 1:30 but pt can't come today because he is having a gallbladder attack and is on his way to ED so he will not make appt but you can call the son if needed

## 2012-04-27 NOTE — Telephone Encounter (Signed)
Patient son called Perlie Gold) family taking Shawn [name] to [facility] ED due to increase of symptoms.  Patient informed that our [facility] PA will be notified after ED work up and will discuss treatment plan if needed.  Patient has follow up appointment with Dr. Johna Sheriff on 04/29/12.

## 2012-04-27 NOTE — ED Notes (Signed)
Pt c/o epigastric and abd pain x 3 weeks; pt sts had hydascan and needs gall bladder removed; pt with increased pain and nausea; pt had appt this Wednesday with Dr Adriana Mccallum for surgical eval but not feeling well so brought here

## 2012-04-27 NOTE — Progress Notes (Signed)
Patient ID: Shawn [name], male   DOB: [birth year], 78 y.o.   MRN: [id number] This patient was referred to the [facility] for worsening pain from his biliary dyskinesia that was diagnosed via HIDA scan recently by Dr. Karolee Ohs.  He is set up to see Dr. Johna Sheriff in our office this Wednesday.  He had worse abdominal pain yesterday, which is better today, but his son wanted to bring him in case a cholecystectomy could be done by any surgeon sooner.  I was asked to see him in the ED.    He states he is a little nauseous currently, but his pain is only mild.  The patient is known to have multiple comorbidities, but is also on Pradaxa.  Given his CKD, he would have to hold his Pradaxa for 3-4 days prior to surgery.  Given, normal labs and biliary dyskinesia not being an emergent need for admission, the patient was more than willing to return home with symptomatic treatment and follow up with Dr. Johna Sheriff.    He also has an appointment today with his cardiologist, Dr. Riley Kill.  I informed him to let him know of possible pending surgery to go ahead and work on cardiac clearance.  This all was d/w the patient and his 2 sons who understand and are in complete agreement.  OSBORNE,KELLY E 2:14 PM 04/27/2012  Tresa Endo reviewed all this with me and I agree.  Ovidio Kin, MD, [facility] Surgery Pager: [phone] Office phone:  [phone]

## 2012-04-27 NOTE — ED Provider Notes (Signed)
I reviewed the resident's note and I agree with the findings and plan.     Nelia Shi, MD 04/27/12 [phone]

## 2012-04-27 NOTE — ED Provider Notes (Signed)
History     CSN: [id number]  Arrival date & time 04/27/12  0940   First MD Initiated Contact with Patient 04/27/12 [phone]      Chief Complaint  Patient presents with  . Abdominal Pain    HPI 78 yo male who presents with epigastric abdominal pain x3 weeks. Had HIDA scan on 7/24 that showed low gallbladder EF. Was told that he needed gallbladder removed. Scheduled for appt with Dr Adriana Mccallum for surgical eval but not feeling well so he was brought here. Pain is intermittent, 10/10 at its worst, lasts a few hours. not related to food. Takes tramadol for pain, which helps. Reports nausea which is controled with zofran. No vomiting. No diarrhea or constipation. No chills or fever.   Past Medical History  Diagnosis Date  . CAD (coronary artery disease)     a. 1990 CABGx3: LIMA->LAD, VG->OM, VG->dRCA;  b. 05/2007 VF Arrest/Cath/PCI: 100 VG->OM tx w/ BMS, 95 VG->RCA tx w/ bms;  c. 06/2007 90 Native PDA tx w DES ;  d. 05/2009 Echo EF 60-65%, nl WM, mild MR; e. normal lexiscan myoview 03/30/12  . HTN (hypertension)   . Hypercholesteremia   . Diabetes mellitus   . Atrial fibrillation     a. had been on tikosyn -> d/c 09/2010;  b. Was on Amio ->d/c 09/2011;  c.  anticoagulated w/ Pradaxa  . Junctional ectopic tachycardia     a. noted 09/2010  . Sinus bradycardia   . CKD (chronic kidney disease), stage III   . Back pain     a. Followed by [facility] - on prednisone  . Prostate cancer   . Unsteady gait     a. in rehab for core strengthening  . Colitis   . Tubular adenoma of colon 02/2007  . Myocardial infarction     Past Surgical History  Procedure Date  . Coronary artery bypass graft 1990  . Prostate surgery 01/1992    retropubic prostatectomy  . Cardiac valve surgery   . Coronary angioplasty with stent placement 2008    x 3   . Rt finger trigger a-1 pulley     Family History  Problem Relation Age of Onset  . Other Father     died of unknown cause @ young age  . Coronary artery disease Mother       died in late 34's w/ "hardening of the arteries"    History  Substance Use Topics  . Smoking status: Former Smoker    Quit date: 09/23/1988  . Smokeless tobacco: Never Used   Comment: quit 20+ years ago  . Alcohol Use: No      Review of Systems  All other systems reviewed and are negative.    Allergies  Review of patient's allergies indicates no known allergies.  Home Medications   Current Outpatient Rx  Name Route Sig Dispense Refill  . ASPIRIN 81 MG PO TABS Oral Take 81 mg by mouth every other day.     Marland Kitchen DABIGATRAN ETEXILATE MESYLATE 150 MG PO CAPS Oral Take 150 mg by mouth daily. Once to twice daily    . DONEPEZIL HCL 10 MG PO TABS Oral Take 10 mg by mouth at bedtime as needed.    Marland Kitchen FINASTERIDE 5 MG PO TABS Oral Take 5 mg by mouth Daily.     . FUROSEMIDE 20 MG PO TABS Oral Take 20 mg by mouth daily.    Marland Kitchen GLIPIZIDE 10 MG PO TABS Oral Take 10 mg by mouth  daily.      Marland Kitchen LANSOPRAZOLE 30 MG PO CPDR Oral Take 1 capsule (30 mg total) by mouth daily. 30 capsule 11  . LEVOTHYROXINE SODIUM 75 MCG PO TABS Oral Take 75 mcg by mouth daily.    Marland Kitchen LISINOPRIL 10 MG PO TABS Oral Take 10 mg by mouth daily.    Marland Kitchen ONE-DAILY MULTI VITAMINS PO TABS Oral Take 1 tablet by mouth daily.      Marland Kitchen NITROGLYCERIN 0.4 MG SL SUBL Sublingual Place 0.4 mg under the tongue every 5 (five) minutes as needed. For chest pain    . POTASSIUM CHLORIDE CRYS ER 10 MEQ PO TBCR Oral Take 10 mEq by mouth daily.    Marland Kitchen ROSUVASTATIN CALCIUM 10 MG PO TABS Oral Take 10 mg by mouth daily.    . SERTRALINE HCL 100 MG PO TABS Oral Take 100 mg by mouth daily.    . TRAMADOL HCL 50 MG PO TABS Oral Take 1 tablet (50 mg total) by mouth every 6 (six) hours as needed for pain. 30 tablet 0    BP 141/71  Pulse 59  Resp 22  SpO2 100%  Physical Exam  Constitutional: He is oriented to person, place, and time. He appears well-developed. No distress.  HENT:  Mouth/Throat: Oropharynx is clear and moist.  Cardiovascular: Normal rate  and regular rhythm.        Distant heart sounds  Pulmonary/Chest: Effort normal and breath sounds normal. No respiratory distress.  Abdominal: Soft. Bowel sounds are normal. He exhibits no distension. There is no tenderness. There is no rebound and no guarding.  Neurological: He is alert and oriented to person, place, and time.  Skin: Skin is warm and dry.    ED Course  Procedures  CBC    Component Value Date/Time   WBC 8.9 04/27/2012 1039   RBC 4.79 04/27/2012 1039   HGB 13.7 04/27/2012 1039   HCT 40.0 04/27/2012 1039   PLT 246 04/27/2012 1039   MCV 83.5 04/27/2012 1039   MCH 28.6 04/27/2012 1039   MCHC 34.3 04/27/2012 1039   RDW 13.7 04/27/2012 1039   LYMPHSABS 2.2 04/27/2012 1039   MONOABS 0.8 04/27/2012 1039   EOSABS 0.3 04/27/2012 1039   BASOSABS 0.0 04/27/2012 1039   CMP     Component Value Date/Time   NA 136 04/27/2012 1039   K 4.1 04/27/2012 1039   CL 103 04/27/2012 1039   CO2 23 04/27/2012 1039   GLUCOSE 75 04/27/2012 1039   BUN 20 04/27/2012 1039   CREATININE 1.43* 04/27/2012 1039   CALCIUM 9.0 04/27/2012 1039   PROT 6.5 04/27/2012 1039   ALBUMIN 3.3* 04/27/2012 1039   AST 19 04/27/2012 1039   ALT 13 04/27/2012 1039   ALKPHOS 74 04/27/2012 1039   BILITOT 0.4 04/27/2012 1039   GFRNONAA 44* 04/27/2012 1039   GFRAA 51* 04/27/2012 1039   Lipase     Component Value Date/Time   LIPASE 28 04/27/2012 1039   EKG: Normal sinus Rate = 61 Axis: normal QRS=108 ms No acute T wave inversion or ST elevation  Labs Reviewed - No data to display No results found.  Diagnosis: Biliary dyskinesia   MDM  10:45am: surgery office contacted. Surgery to come evaluate patient.  Spoke with Unknown Jim with surgery who recommends discharge home with phenergan 12.5mg  and vicodin for pain in addition to tramadol.  Patient discharged home with the above medications with instructions to follow up with Dr. Johna Sheriff on Wednesday  Marena Chancy, PGY-1  [facility] Resident      Lonia Skinner,  MD 04/27/12 [phone]

## 2012-04-29 ENCOUNTER — Ambulatory Visit (INDEPENDENT_AMBULATORY_CARE_PROVIDER_SITE_OTHER): Payer: Medicare Other | Admitting: General Surgery

## 2012-04-29 ENCOUNTER — Encounter (INDEPENDENT_AMBULATORY_CARE_PROVIDER_SITE_OTHER): Payer: Self-pay

## 2012-04-29 ENCOUNTER — Encounter (INDEPENDENT_AMBULATORY_CARE_PROVIDER_SITE_OTHER): Payer: Self-pay | Admitting: General Surgery

## 2012-04-29 VITALS — BP 160/64 | HR 76 | Temp 97.2°F | Resp 20 | Ht 71.0 in | Wt 214.0 lb

## 2012-04-29 DIAGNOSIS — K8010 Calculus of gallbladder with chronic cholecystitis without obstruction: Secondary | ICD-10-CM

## 2012-04-29 NOTE — Progress Notes (Signed)
Subjective:   Abdominal pain and nausea  Patient ID: Shawn [name], male   DOB: 10/05/1927, 78 y.o.   MRN: [id number]  HPI Patient is a pleasant 78-year-old male referred by Dr. Stark for apparent symptomatic gallbladder disease. He was in his usual state of health until approximately one month ago. At that time he had an initial severe episode of epigastric abdominal pain associated with nausea and vomiting. He was evaluated in the emergency room. CT scan was obtained which are reviewed. As far as his abdomen this was significant only for small gallstones in the neck of the gallbladder. There was no evidence of acute cholecystitis. Also noted was a pulmonary nodule 1.5 cm which is in process of evaluation. Since that time he has had essentially daily symptoms. This will often wake him early in the morning and consisted of epigastric and generalized abdominal pain as well as nausea without vomiting. There is been some radiation to the back. Bowel movements are normal. No urinary symptoms.  He has seen Dr. Stark for evaluation and EGD was performed which was negative .  A small hiatal hernia was noted. Subsequently a HIDA scan was obtained which shows a reduced ejection fraction of 9%. He is also recently had a cardiac evaluation by Dr. Stucky as his initial symptoms were similar to his previous heart attack and verbal report per patient and his family is this was all negative and he has been cleared for potential surgery.  Past Medical History  Diagnosis Date  . CAD (coronary artery disease)     a. 1990 CABGx3: LIMA->LAD, VG->OM, VG->dRCA;  b. 05/2007 VF Arrest/Cath/PCI: 100 VG->OM tx w/ BMS, 95 VG->RCA tx w/ bms;  c. 06/2007 90 Native PDA tx w DES ;  d. 05/2009 Echo EF 60-65%, nl WM, mild MR; e. normal lexiscan myoview 03/30/12  . HTN (hypertension)   . Hypercholesteremia   . Diabetes mellitus   . Atrial fibrillation     a. had been on tikosyn -> d/c 09/2010;  b. Was on Amio ->d/c 09/2011;  c.   anticoagulated w/ Pradaxa  . Junctional ectopic tachycardia     a. noted 09/2010  . Sinus bradycardia   . CKD (chronic kidney disease), stage III   . Back pain     a. Followed by [facility] - on prednisone  . Prostate cancer   . Unsteady gait     a. in rehab for core strengthening  . Colitis   . Tubular adenoma of colon 02/2007  . Myocardial infarction   . Arthritis   . CHF (congestive heart failure)    Past Surgical History  Procedure Date  . Coronary artery bypass graft 1990  . Prostate surgery 01/1992    retropubic prostatectomy  . Cardiac valve surgery   . Coronary angioplasty with stent placement 2008    x 3   . Rt finger trigger a-1 pulley    Current Outpatient Prescriptions  Medication Sig Dispense Refill  . aspirin 81 MG tablet Take 81 mg by mouth every other day.       . dabigatran (PRADAXA) 150 MG CAPS Take 150 mg by mouth daily. Once to twice daily      . donepezil (ARICEPT) 10 MG tablet Take 10 mg by mouth at bedtime as needed.      . finasteride (PROSCAR) 5 MG tablet Take 5 mg by mouth Daily.       . furosemide (LASIX) 20 MG tablet Take 20 mg by mouth daily.      .   glipiZIDE (GLUCOTROL) 10 MG tablet Take 10 mg by mouth daily.        . HYDROcodone-acetaminophen (VICODIN) 5-500 MG per tablet Take 1 tablet by mouth every 6 (six) hours as needed for pain.  30 tablet  0  . lansoprazole (PREVACID) 30 MG capsule Take 1 capsule (30 mg total) by mouth daily.  30 capsule  11  . levothyroxine (SYNTHROID, LEVOTHROID) 75 MCG tablet Take 75 mcg by mouth daily.      . lisinopril (PRINIVIL,ZESTRIL) 10 MG tablet Take 10 mg by mouth daily.      . Multiple Vitamin (MULTIVITAMIN) tablet Take 1 tablet by mouth daily.        . nitroGLYCERIN (NITROSTAT) 0.4 MG SL tablet Place 0.4 mg under the tongue every 5 (five) minutes as needed. For chest pain      . potassium chloride (K-DUR,KLOR-CON) 10 MEQ tablet Take 10 mEq by mouth daily.      . promethazine (PHENERGAN) 12.5 MG tablet Take 1 tablet  (12.5 mg total) by mouth every 6 (six) hours as needed for nausea.  30 tablet  0  . rosuvastatin (CRESTOR) 10 MG tablet Take 10 mg by mouth daily.      . sertraline (ZOLOFT) 100 MG tablet Take 100 mg by mouth daily.      . traMADol (ULTRAM) 50 MG tablet Take 1 tablet (50 mg total) by mouth every 6 (six) hours as needed for pain.  30 tablet  0  . DISCONTD: rivastigmine (EXELON) 4.6 mg/24hr Place 1 patch onto the skin daily.       No Known Allergies   Review of Systems  Respiratory: Negative.   Cardiovascular: Negative.   Gastrointestinal: Positive for nausea, abdominal pain and constipation. Negative for diarrhea, blood in stool and anal bleeding.  Genitourinary: Negative.        Objective:   Physical Exam General: Alert, well-developed elderly Caucasian male, in no distress Skin: Warm and dry without rash or infection. HEENT: No palpable masses or thyromegaly. Sclera nonicteric. Pupils equal round and reactive. Oropharynx clear. Lymph nodes: No cervical, supraclavicular, or inguinal nodes palpable. Lungs: Breath sounds clear and equal without increased work of breathing Cardiovascular: Regular rate and rhythm without murmur. No JVD or edema. Well healed sternotomy Abdomen: Nondistended. Moderate epigastric and RUQ tenderness. No masses palpable. No organomegaly. No palpable hernias. Extremities: No edema or joint swelling or deformity. No chronic venous stasis changes. Neurologic: Alert and fully oriented. Gait normal.     Assessment:     Very likely symptomatic gallbladder disease. He has gallstones seen on CT scan and an abnormal HIDA scan. No other cause for abdominal pain identified on endoscopy or CT.  Pulmonary nodule. Workup is in progress.  Coronary artery disease. On chronic anticoagulation. We have verbal clearance and we'll go ahead with scheduling and obtain written clearance.    Plan:     I've recommended proceeding with laparoscopic cholecystectomy and  cholangiogram in an effort to relieve his symptoms and prevent complications from his gallstones.I discussed the procedure in detail.  The patient was given educational material.  We discussed the risks and benefits of a laparoscopic cholecystectomy and possible cholangiogram including, but not limited to bleeding, infection, injury to surrounding structures such as the intestine or liver, bile leak, retained gallstones, need to convert to an open procedure, prolonged diarrhea, blood clots such as  DVT, common bile duct injury, anesthesia risks, and possible need for additional procedures.  The likelihood of improvement in symptoms and   return to the patient's normal status is good. We discussed the typical post-operative recovery course.       

## 2012-04-29 NOTE — Patient Instructions (Signed)
Gallbladder Disease  Gallbladder disease (cholecystitis) is an inflammation of your gallbladder. It is usually caused by a build-up of stones (gallstones) or sludge (cholelithiasis) in your gallbladder. The gallbladder is not an essential organ. It is located slightly to the right of center in the belly (abdomen), behind the liver. It stores bile made in the liver. Bile aids in digestion of fats. Gallbladder disease may result in nausea (feeling sick to your stomach), abdominal pain, and jaundice. In severe cases, emergency surgery may be required.  The most common type of gallbladder disease is gallstones. They begin as small crystals and slowly grow into stones. Gallstone pain occurs when the bile duct has spasms. The spasms are caused by the stone passing out of the duct. The stone is trying to pass at the same time bile is passing into the small bowel for digestion. The pain usually begins suddenly. It may persist from several minutes to several hours. Infection can occur. Infection can add to discomfort and severity of an acute attack. The pain may be made worse by breathing deeply or by being jarred. There may be fever and tenderness to the touch. In some cases, when gallstones do not move into the bile duct, people have no pain or symptoms. These are called "silent" gallstones.  Women are three times more likely to develop gallstones than men. Women who have had several pregnancies are more likely to have gallbladder disease. Physicians sometimes advise removing diseased gallbladders before future pregnancies. Other factors that increase the risk of gallbladder disease are obesity, diets heavy in fried foods and dairy products, increasing age, prolonged use of medications containing male hormones, and heredity.  HOME CARE INSTRUCTIONS    If your physician prescribed an antibiotic, take as directed.   Only take over-the-counter or prescription medicines for pain, discomfort, or fever as directed by your  caregiver.   Follow a low fat diet until seen again. (Fat causes the gallbladder to contract.)   Follow-up as instructed. Attacks are almost always recurrent and surgery is usually required for permanent treatment.  SEEK IMMEDIATE MEDICAL CARE IF:    Pain is increasing and not controlled by medications.   The pain moves to another part of your abdomen or to your back. (Right sided pain can be appendicitis and left sided pain in adults can be diverticulitis).   You have a fever.   You develop nausea and vomiting.  Document Released: 09/09/2005 Document Revised: 08/29/2011 Document Reviewed: 07/26/2011  [employer] Patient Information 2012 [employer].

## 2012-04-30 ENCOUNTER — Encounter (HOSPITAL_COMMUNITY): Payer: Self-pay | Admitting: Pharmacy Technician

## 2012-04-30 ENCOUNTER — Telehealth (INDEPENDENT_AMBULATORY_CARE_PROVIDER_SITE_OTHER): Payer: Self-pay | Admitting: General Surgery

## 2012-04-30 NOTE — Telephone Encounter (Signed)
[facility] CALLED TO REQUEST ORDERS FOR PT Shawn [name]/ SURGERY 05-05-12/ [facility]- [phone]

## 2012-05-01 ENCOUNTER — Encounter (HOSPITAL_COMMUNITY): Payer: Self-pay

## 2012-05-01 ENCOUNTER — Encounter (HOSPITAL_COMMUNITY)
Admission: RE | Admit: 2012-05-01 | Discharge: 2012-05-01 | Disposition: A | Discharge status: Home / Self Care | Payer: Medicare Other | Source: Ambulatory Visit | Attending: General Surgery | Admitting: General Surgery

## 2012-05-01 HISTORY — DX: Depression, unspecified: F32.A

## 2012-05-01 HISTORY — DX: Major depressive disorder, single episode, unspecified: F32.9

## 2012-05-01 LAB — SURGICAL PCR SCREEN
MRSA, PCR: NEGATIVE
Staphylococcus aureus: NEGATIVE

## 2012-05-01 NOTE — Pre-Procedure Instructions (Signed)
20 COLEN ELTZROTH  05/01/2012   Your procedure is scheduled on:  Tuesday May 05, 2012  Report to [facility] at 12:45PM.  Call this number if you have problems the morning of surgery: [phone]   Remember:   Do not eat food or drink:After Midnight.      Take these medicines the morning of surgery with A SIP OF WATER: vicodin, prevacid, synthroid, nitrostat, zofran, zoloft, tramadol, proscar   Do not wear jewelry, make-up or nail polish.  Do not wear lotions, powders, or perfumes. You may wear deodorant.  Do not shave 48 hours prior to surgery. Men may shave face and neck.  Do not bring valuables to the hospital.  Contacts, dentures or bridgework may not be worn into surgery.  Leave suitcase in the car. After surgery it may be brought to your room.  For patients admitted to the hospital, checkout time is 11:00 AM the day of discharge.   Patients discharged the day of surgery will not be allowed to drive home.  Name and phone number of your driver:   Special Instructions: CHG Shower Use Special Wash: 1/2 bottle night before surgery and 1/2 bottle morning of surgery.   Please read over the following fact sheets that you were given: Pain Booklet, Coughing and Deep Breathing, MRSA Information and Surgical Site Infection Prevention

## 2012-05-01 NOTE — Telephone Encounter (Signed)
I met with the patient and reviewed things with him.  It is possible that symptoms could be from pradaxa.  He is moderately high risk for surgery, but further evaluation is not likely to change that high risk.  I sat and reviewed all of this with the patient and his son.  They clearly understand the current issues.  Regarding his antithrombin, it would be ok for him to stop his Pradaxa, and post op I would favor, when safe, the use of Xarelto, to eliminate any possible GI side effects.

## 2012-05-04 ENCOUNTER — Other Ambulatory Visit (INDEPENDENT_AMBULATORY_CARE_PROVIDER_SITE_OTHER): Payer: Self-pay | Admitting: General Surgery

## 2012-05-04 MED ORDER — CEFAZOLIN SODIUM-DEXTROSE 2-3 GM-% IV SOLR
2.0000 g | INTRAVENOUS | Status: AC
  Administered 2012-05-05: 2 g via INTRAVENOUS
  Filled 2012-05-04: qty 50

## 2012-05-04 NOTE — Consult Note (Signed)
Anesthesia chart review: Patient is an 78 year old male posted for laparoscopic cholecystectomy on 05/05/2012 by Dr. Johna Sheriff. History includes former smoker, coronary artery disease/MI s/p CABG '90 (LIMA to LAD, SVG to distal RCA, SVG to OM), vfib arrest 05/2007 s/p stenting of his OM and RCA grafts, CHF, hypertension, diabetes mellitus type II, arrythmias including atrial fibrillation and junctional rhythm, hypercholesterolemia, chronic kidney disease stage III, prostate cancer s/p surgery, depression, adenoma of the colon, back pain.  (History states he had "cardiac valve surgery", but I do not see this reflected in his Cardiology notes or 2010 echo report.)  Primary cardiologist is Dr. Riley Kill, EP cardiologist is Dr. Graciela Husbands.  Lexiscan myoview on 03/30/12 revealed no ischemia, infarction, or segmental wall motion abnormalities, EF 53%.  Dr. Riley Kill is aware of planned procedure and feels patient is "moderately high risk for surgery."  (See telephone encounter from 05/01/12.)  EKG on 04/27/12 showed SR, non-specific intraventricular conduction delay, non-specific ST abnormality.  Echo on 05/31/12 showed: 1. Left ventricle: There was moderate concentric hypertrophy. Systolic function was normal. The estimated ejection fraction was in the range of 60% to 65%. Wall motion was normal; there were no regional wall motion abnormalities. 2. Mitral valve: Mild regurgitation. 3. Left atrium: The atrium was moderately dilated. Impressions: - Technically difficult study due to poor sound wave transmission.  Cardiac cath on 06/03/07 showed: 1. Total occlusion of the saphenous vein graft to the obtuse marginal with successful reperfusion therapy using a 3.0 x 23 non drug- eluting stent, and distal protection.  2. Continued patency of the internal mammary to the LAD with a diffusely diseased LAD.  3. Continued patency of the saphenous vein graft to the distal right with high-grade saphenous vein graft disease. (s/p Guidant  Ultra stent on 06/10/07.) 4. Known total occlusion of the circumflex, right coronary artery and left anterior descending arteries.   Chest x-ray from 04/10/2012 showed evidence of previous CABG and no active cardiopulmonary disease.   Labs showed BUN/Cr 20/1.43.  His Cr appears stable since at least January 2013.  If no acute CV symptoms, then anticipate he can proceed as planned.  Shonna Chock, PA-C

## 2012-05-05 ENCOUNTER — Telehealth: Payer: Self-pay | Admitting: Cardiology

## 2012-05-05 ENCOUNTER — Encounter (HOSPITAL_COMMUNITY): Payer: Self-pay | Admitting: Vascular Surgery

## 2012-05-05 ENCOUNTER — Ambulatory Visit (HOSPITAL_COMMUNITY): Payer: Medicare Other

## 2012-05-05 ENCOUNTER — Encounter (HOSPITAL_COMMUNITY): Payer: Self-pay

## 2012-05-05 ENCOUNTER — Ambulatory Visit (HOSPITAL_COMMUNITY)
Admission: RE | Admit: 2012-05-05 | Discharge: 2012-05-06 | Disposition: A | Discharge status: Home / Self Care | Payer: Medicare Other | Source: Ambulatory Visit | Attending: General Surgery | Admitting: General Surgery

## 2012-05-05 ENCOUNTER — Encounter (HOSPITAL_COMMUNITY)
Admission: RE | Disposition: A | Discharge status: Home / Self Care | Payer: Self-pay | Source: Ambulatory Visit | Attending: General Surgery

## 2012-05-05 ENCOUNTER — Ambulatory Visit (HOSPITAL_COMMUNITY): Payer: Medicare Other | Admitting: Vascular Surgery

## 2012-05-05 DIAGNOSIS — I129 Hypertensive chronic kidney disease with stage 1 through stage 4 chronic kidney disease, or unspecified chronic kidney disease: Secondary | ICD-10-CM | POA: Insufficient documentation

## 2012-05-05 DIAGNOSIS — Z01812 Encounter for preprocedural laboratory examination: Secondary | ICD-10-CM | POA: Insufficient documentation

## 2012-05-05 DIAGNOSIS — N183 Chronic kidney disease, stage 3 unspecified: Secondary | ICD-10-CM | POA: Insufficient documentation

## 2012-05-05 DIAGNOSIS — K8010 Calculus of gallbladder with chronic cholecystitis without obstruction: Secondary | ICD-10-CM

## 2012-05-05 DIAGNOSIS — K811 Chronic cholecystitis: Secondary | ICD-10-CM

## 2012-05-05 DIAGNOSIS — R269 Unspecified abnormalities of gait and mobility: Secondary | ICD-10-CM | POA: Insufficient documentation

## 2012-05-05 DIAGNOSIS — M549 Dorsalgia, unspecified: Secondary | ICD-10-CM | POA: Insufficient documentation

## 2012-05-05 DIAGNOSIS — I4891 Unspecified atrial fibrillation: Secondary | ICD-10-CM | POA: Insufficient documentation

## 2012-05-05 DIAGNOSIS — IMO0002 Reserved for concepts with insufficient information to code with codable children: Secondary | ICD-10-CM | POA: Insufficient documentation

## 2012-05-05 DIAGNOSIS — Z9861 Coronary angioplasty status: Secondary | ICD-10-CM | POA: Insufficient documentation

## 2012-05-05 DIAGNOSIS — I2510 Atherosclerotic heart disease of native coronary artery without angina pectoris: Secondary | ICD-10-CM | POA: Insufficient documentation

## 2012-05-05 DIAGNOSIS — E119 Type 2 diabetes mellitus without complications: Secondary | ICD-10-CM | POA: Insufficient documentation

## 2012-05-05 DIAGNOSIS — Z01811 Encounter for preprocedural respiratory examination: Secondary | ICD-10-CM | POA: Insufficient documentation

## 2012-05-05 HISTORY — PX: CHOLECYSTECTOMY: SHX55

## 2012-05-05 LAB — GLUCOSE, CAPILLARY
Glucose-Capillary: 89 mg/dL (ref 70–99)
Glucose-Capillary: 109 mg/dL — ABNORMAL HIGH (ref 70–99)
Glucose-Capillary: 139 mg/dL — ABNORMAL HIGH (ref 70–99)

## 2012-05-05 SURGERY — LAPAROSCOPIC CHOLECYSTECTOMY WITH INTRAOPERATIVE CHOLANGIOGRAM
Anesthesia: General | Site: Abdomen | Wound class: Clean Contaminated

## 2012-05-05 MED ORDER — 0.9 % SODIUM CHLORIDE (POUR BTL) OPTIME
TOPICAL | Status: DC | PRN
  Administered 2012-05-05: 1000 mL

## 2012-05-05 MED ORDER — POTASSIUM CHLORIDE IN NACL 20-0.9 MEQ/L-% IV SOLN
INTRAVENOUS | Status: DC
  Administered 2012-05-05 – 2012-05-06 (×2): via INTRAVENOUS
  Filled 2012-05-05 (×3): qty 1000

## 2012-05-05 MED ORDER — BUPIVACAINE-EPINEPHRINE 0.5% -1:200000 IJ SOLN
INTRAMUSCULAR | Status: DC | PRN
  Administered 2012-05-05: 14 mL

## 2012-05-05 MED ORDER — LACTATED RINGERS IV SOLN
INTRAVENOUS | Status: DC | PRN
  Administered 2012-05-05 (×2): via INTRAVENOUS

## 2012-05-05 MED ORDER — ONDANSETRON HCL 4 MG/2ML IJ SOLN
4.0000 mg | Freq: Four times a day (QID) | INTRAMUSCULAR | Status: DC | PRN
  Administered 2012-05-05: 4 mg via INTRAVENOUS
  Filled 2012-05-05: qty 2

## 2012-05-05 MED ORDER — HYDROMORPHONE HCL PF 1 MG/ML IJ SOLN: 0.2500 mg | INTRAMUSCULAR | Status: DC | PRN

## 2012-05-05 MED ORDER — PROPOFOL 10 MG/ML IV EMUL
INTRAVENOUS | Status: DC | PRN
  Administered 2012-05-05: 100 mg via INTRAVENOUS

## 2012-05-05 MED ORDER — NEOSTIGMINE METHYLSULFATE 1 MG/ML IJ SOLN
INTRAMUSCULAR | Status: DC | PRN
  Administered 2012-05-05: 5 mg via INTRAVENOUS

## 2012-05-05 MED ORDER — SODIUM CHLORIDE 0.9 % IV SOLN
INTRAVENOUS | Status: DC | PRN
  Administered 2012-05-05: 15:00:00

## 2012-05-05 MED ORDER — INSULIN ASPART 100 UNIT/ML ~~LOC~~ SOLN
0.0000 [IU] | Freq: Three times a day (TID) | SUBCUTANEOUS | Status: DC
  Administered 2012-05-06: 2 [IU] via SUBCUTANEOUS

## 2012-05-05 MED ORDER — LABETALOL HCL 5 MG/ML IV SOLN
INTRAVENOUS | Status: DC | PRN
  Administered 2012-05-05: 10 mg via INTRAVENOUS

## 2012-05-05 MED ORDER — BUPIVACAINE-EPINEPHRINE (PF) 0.5% -1:200000 IJ SOLN
INTRAMUSCULAR | Status: AC
  Filled 2012-05-05: qty 10

## 2012-05-05 MED ORDER — FUROSEMIDE 20 MG PO TABS
20.0000 mg | ORAL_TABLET | Freq: Every day | ORAL | Status: DC
  Administered 2012-05-05 – 2012-05-06 (×2): 20 mg via ORAL
  Filled 2012-05-05 (×2): qty 1

## 2012-05-05 MED ORDER — SODIUM CHLORIDE 0.9 % IR SOLN
Status: DC | PRN
  Administered 2012-05-05: 1000 mL

## 2012-05-05 MED ORDER — MORPHINE SULFATE 2 MG/ML IJ SOLN
2.0000 mg | INTRAMUSCULAR | Status: DC | PRN
Start: 2012-05-05 — End: 2012-05-06
  Administered 2012-05-05 (×3): 2 mg via INTRAVENOUS
  Filled 2012-05-05 (×2): qty 1

## 2012-05-05 MED ORDER — LACTATED RINGERS IV SOLN
INTRAVENOUS | Status: DC
  Administered 2012-05-05: 14:00:00 via INTRAVENOUS

## 2012-05-05 MED ORDER — ROCURONIUM BROMIDE 100 MG/10ML IV SOLN
INTRAVENOUS | Status: DC | PRN
  Administered 2012-05-05: 40 mg via INTRAVENOUS

## 2012-05-05 MED ORDER — LISINOPRIL 10 MG PO TABS
10.0000 mg | ORAL_TABLET | Freq: Every day | ORAL | Status: DC
  Administered 2012-05-05 – 2012-05-06 (×2): 10 mg via ORAL
  Filled 2012-05-05 (×2): qty 1

## 2012-05-05 MED ORDER — LEVOTHYROXINE SODIUM 75 MCG PO TABS
75.0000 ug | ORAL_TABLET | Freq: Every day | ORAL | Status: DC
  Administered 2012-05-05: 75 ug via ORAL
  Filled 2012-05-05 (×2): qty 1

## 2012-05-05 MED ORDER — ONDANSETRON HCL 4 MG/2ML IJ SOLN
INTRAMUSCULAR | Status: DC | PRN
  Administered 2012-05-05: 4 mg via INTRAVENOUS

## 2012-05-05 MED ORDER — FENTANYL CITRATE 0.05 MG/ML IJ SOLN
INTRAMUSCULAR | Status: DC | PRN
  Administered 2012-05-05 (×3): 50 ug via INTRAVENOUS
  Administered 2012-05-05: 100 ug via INTRAVENOUS

## 2012-05-05 MED ORDER — FINASTERIDE 5 MG PO TABS
5.0000 mg | ORAL_TABLET | Freq: Every day | ORAL | Status: DC
  Administered 2012-05-05 – 2012-05-06 (×2): 5 mg via ORAL
  Filled 2012-05-05 (×2): qty 1

## 2012-05-05 MED ORDER — LIDOCAINE HCL 1 % IJ SOLN
INTRAMUSCULAR | Status: DC | PRN
  Administered 2012-05-05: 40 mg via INTRADERMAL

## 2012-05-05 MED ORDER — SERTRALINE HCL 100 MG PO TABS
100.0000 mg | ORAL_TABLET | Freq: Every day | ORAL | Status: DC
  Administered 2012-05-05 – 2012-05-06 (×2): 100 mg via ORAL
  Filled 2012-05-05 (×2): qty 1

## 2012-05-05 MED ORDER — EPHEDRINE SULFATE 50 MG/ML IJ SOLN
INTRAMUSCULAR | Status: DC | PRN
  Administered 2012-05-05: 10 mg via INTRAVENOUS

## 2012-05-05 MED ORDER — POTASSIUM CHLORIDE CRYS ER 10 MEQ PO TBCR
10.0000 meq | EXTENDED_RELEASE_TABLET | Freq: Every day | ORAL | Status: DC
  Administered 2012-05-05 – 2012-05-06 (×2): 10 meq via ORAL
  Filled 2012-05-05 (×2): qty 1

## 2012-05-05 MED ORDER — HEPARIN SODIUM (PORCINE) 5000 UNIT/ML IJ SOLN
5000.0000 [IU] | Freq: Three times a day (TID) | INTRAMUSCULAR | Status: DC
  Administered 2012-05-06: 5000 [IU] via SUBCUTANEOUS
  Filled 2012-05-05 (×4): qty 1

## 2012-05-05 MED ORDER — GLYCOPYRROLATE 0.2 MG/ML IJ SOLN
INTRAMUSCULAR | Status: DC | PRN
  Administered 2012-05-05: .8 mg via INTRAVENOUS

## 2012-05-05 MED ORDER — NITROGLYCERIN 0.4 MG SL SUBL: 0.4000 mg | SUBLINGUAL_TABLET | SUBLINGUAL | Status: DC | PRN

## 2012-05-05 MED ORDER — OXYCODONE-ACETAMINOPHEN 5-325 MG PO TABS
1.0000 | ORAL_TABLET | ORAL | Status: DC | PRN
  Administered 2012-05-06: 1 via ORAL
  Filled 2012-05-05: qty 1

## 2012-05-05 MED ORDER — BUPIVACAINE-EPINEPHRINE PF 0.25-1:200000 % IJ SOLN
INTRAMUSCULAR | Status: AC
  Filled 2012-05-05: qty 30

## 2012-05-05 MED ORDER — MORPHINE SULFATE 2 MG/ML IJ SOLN
INTRAMUSCULAR | Status: AC
  Administered 2012-05-05: 2 mg via INTRAVENOUS
  Filled 2012-05-05: qty 1

## 2012-05-05 MED ORDER — ONDANSETRON HCL 4 MG PO TABS: 4.0000 mg | ORAL_TABLET | Freq: Four times a day (QID) | ORAL | Status: DC | PRN

## 2012-05-05 SURGICAL SUPPLY — 47 items
APPLIER CLIP ROT 10 11.4 M/L (STAPLE) ×4
APR CLP MED LRG 11.4X10 (STAPLE) ×2
BLADE SURG ROTATE 9660 (MISCELLANEOUS) IMPLANT
CANISTER SUCTION 2500CC (MISCELLANEOUS) ×2 IMPLANT
CHLORAPREP W/TINT 26ML (MISCELLANEOUS) ×2 IMPLANT
CLIP APPLIE ROT 10 11.4 M/L (STAPLE) ×2 IMPLANT
CLOTH BEACON ORANGE TIMEOUT ST (SAFETY) ×2 IMPLANT
COVER MAYO STAND STRL (DRAPES) ×2 IMPLANT
COVER SURGICAL LIGHT HANDLE (MISCELLANEOUS) ×2 IMPLANT
DECANTER SPIKE VIAL GLASS SM (MISCELLANEOUS) ×4 IMPLANT
DERMABOND ADVANCED (GAUZE/BANDAGES/DRESSINGS) ×1
DERMABOND ADVANCED .7 DNX12 (GAUZE/BANDAGES/DRESSINGS) ×1 IMPLANT
DRAPE C-ARM 42X72 X-RAY (DRAPES) ×2 IMPLANT
DRAPE UTILITY 15X26 W/TAPE STR (DRAPE) ×4 IMPLANT
ELECT REM PT RETURN 9FT ADLT (ELECTROSURGICAL) ×2
ELECTRODE REM PT RTRN 9FT ADLT (ELECTROSURGICAL) ×1 IMPLANT
GLOVE BIO SURGEON STRL SZ 6.5 (GLOVE) ×2 IMPLANT
GLOVE BIO SURGEON STRL SZ7 (GLOVE) ×4 IMPLANT
GLOVE BIOGEL PI IND STRL 7.0 (GLOVE) ×1 IMPLANT
GLOVE BIOGEL PI IND STRL 7.5 (GLOVE) ×1 IMPLANT
GLOVE BIOGEL PI INDICATOR 7.0 (GLOVE) ×1
GLOVE BIOGEL PI INDICATOR 7.5 (GLOVE) ×1
GLOVE SS BIOGEL STRL SZ 6.5 (GLOVE) ×1 IMPLANT
GLOVE SS BIOGEL STRL SZ 7.5 (GLOVE) ×1 IMPLANT
GLOVE SUPERSENSE BIOGEL SZ 6.5 (GLOVE) ×1
GLOVE SUPERSENSE BIOGEL SZ 7.5 (GLOVE) ×1
GLOVE SURG SS PI 7.0 STRL IVOR (GLOVE) ×2 IMPLANT
GOWN STRL NON-REIN LRG LVL3 (GOWN DISPOSABLE) ×8 IMPLANT
GOWN STRL REIN XL XLG (GOWN DISPOSABLE) ×2 IMPLANT
KIT BASIN OR (CUSTOM PROCEDURE TRAY) ×2 IMPLANT
KIT ROOM TURNOVER OR (KITS) ×2 IMPLANT
NS IRRIG 1000ML POUR BTL (IV SOLUTION) ×2 IMPLANT
PAD ARMBOARD 7.5X6 YLW CONV (MISCELLANEOUS) ×2 IMPLANT
POUCH SPECIMEN RETRIEVAL 10MM (ENDOMECHANICALS) IMPLANT
SCISSORS LAP 5X35 DISP (ENDOMECHANICALS) IMPLANT
SET CHOLANGIOGRAPH 5 50 .035 (SET/KITS/TRAYS/PACK) ×2 IMPLANT
SET IRRIG TUBING LAPAROSCOPIC (IRRIGATION / IRRIGATOR) ×2 IMPLANT
SLEEVE Z-THREAD 5X100MM (TROCAR) ×2 IMPLANT
SPECIMEN JAR SMALL (MISCELLANEOUS) ×2 IMPLANT
SUT MON AB 5-0 PS2 18 (SUTURE) ×2 IMPLANT
TOWEL OR 17X24 6PK STRL BLUE (TOWEL DISPOSABLE) ×2 IMPLANT
TOWEL OR 17X26 10 PK STRL BLUE (TOWEL DISPOSABLE) ×2 IMPLANT
TRAY LAPAROSCOPIC (CUSTOM PROCEDURE TRAY) ×2 IMPLANT
TROCAR XCEL BLUNT TIP 100MML (ENDOMECHANICALS) ×2 IMPLANT
TROCAR Z-THREAD FIOS 11X100 BL (TROCAR) ×2 IMPLANT
TROCAR Z-THREAD FIOS 5X100MM (TROCAR) ×2 IMPLANT
WATER STERILE IRR 1000ML POUR (IV SOLUTION) IMPLANT

## 2012-05-05 NOTE — Anesthesia Procedure Notes (Signed)
Procedure Name: Intubation Date/Time: 05/05/2012 4:47 PM Performed by: Leona Singleton A Pre-anesthesia Checklist: Patient identified Patient Re-evaluated:Patient Re-evaluated prior to inductionOxygen Delivery Method: Circle system utilized Preoxygenation: Pre-oxygenation with 100% oxygen Intubation Type: IV induction Ventilation: Mask ventilation without difficulty Laryngoscope Size: Miller and 2 Grade View: Grade I Tube type: Oral Tube size: 7.5 mm Number of attempts: 1 Airway Equipment and Method: Stylet Placement Confirmation: ETT inserted through vocal cords under direct vision,  positive ETCO2 and breath sounds checked- equal and bilateral Secured at: 23 cm Tube secured with: Tape Dental Injury: Teeth and Oropharynx as per pre-operative assessment

## 2012-05-05 NOTE — Telephone Encounter (Signed)
Please return call to patient son [phone] regarding medication questions

## 2012-05-05 NOTE — Interval H&P Note (Signed)
History and Physical Interval Note:  05/05/2012 2:17 PM  Shawn [name]  has presented today for surgery, with the diagnosis of gallstones  The various methods of treatment have been discussed with the patient and family. After consideration of risks, benefits and other options for treatment, the patient has consented to  Procedure(s) (LRB): LAPAROSCOPIC CHOLECYSTECTOMY WITH INTRAOPERATIVE CHOLANGIOGRAM (N/A) as a surgical intervention .  The patient's history has been reviewed, patient examined, no change in status, stable for surgery.  I have reviewed the patient's chart and labs.  Questions were answered to the patient's satisfaction.     Karime Scheuermann T

## 2012-05-05 NOTE — Op Note (Signed)
Preoperative diagnosis: Cholelithiasis and cholecystitis  Postoperative diagnosis: Cholelithiasis and cholecystitis  Surgical procedure: Laparoscopic cholecystectomy with intraoperative cholangiogram  Surgeon: Sharlet Salina T. Peggy Monk M.D.  Assistant: Manus Rudd M.D.  Anesthesia: General Endotracheal  Complications: None  Estimated blood loss: Minimal  Description of procedure: The patient brought to the operating room, placed in the supine position on the operating table, and general endotracheal anesthesia induced. The abdomen was widely sterilely prepped and draped. The patient had received preoperative IV antibiotics and PAS were in place. Patient timeout was performed the correct procedure verified. Standard 4 port technique was used with an open Hassan cannula at the umbilicus and the remainder of the ports placed under direct vision. The gallbladder was visualized. It appeared enlarged, chronically thickened and with chronic omental adhesions.. The fundus was grasped and elevated up over the liver and the infundibulum retracted inferiolaterally.Omental adhesions were taken down with blunt dissection and cautery. Peritoneum anterior and posterior to close triangle was incised and fibrofatty tissue stripped off the neck of the gallbladder toward the porta hepatis. The distal gallbladder was thoroughly dissected. The cystic artery was identified in close triangle and the cystic duct gallbladder junction dissected 360.  A good critical view was obtained. When the anatomy was clear the cystic duct was clipped at the gallbladder junction and an operative cholangiogram obtained through the cystic duct. This showed good filling of a normal common bile duct and intrahepatic ducts with free flow into the duodenum and no filling defects. Following this the Cholangiocath was removed and the cystic duct was doubly clipped proximally and divided. The cystic artery was doubly clipped proximally and distally  and divided. The gallbladder was dissected free from its bed using hook cautery and removed through the umbilical port site. Complete hemostasis was obtained in the gallbladder bed. The right upper quadrant was thoroughly irrigated and hemostasis assured. Trochars were removed and all CO2 evacuated and the [facility] trocar site fascial defect closed. Skin incisions were closed with subcuticular Monocryl and Dermabond. Sponge needle and instrument counts were correct. The patient was taken to PACU in good condition.  Xzaiver Vayda T  05/05/2012

## 2012-05-05 NOTE — H&P (View-Only) (Signed)
Subjective:   Abdominal pain and nausea  Patient ID: Shawn [name], male   DOB: 1928-04-27, 78 y.o.   MRN: [id number]  HPI Patient is a pleasant 78 year old male referred by Dr. Russella Dar for apparent symptomatic gallbladder disease. He was in his usual state of health until approximately one month ago. At that time he had an initial severe episode of epigastric abdominal pain associated with nausea and vomiting. He was evaluated in the emergency room. CT scan was obtained which are reviewed. As far as his abdomen this was significant only for small gallstones in the neck of the gallbladder. There was no evidence of acute cholecystitis. Also noted was a pulmonary nodule 1.5 cm which is in process of evaluation. Since that time he has had essentially daily symptoms. This will often wake him early in the morning and consisted of epigastric and generalized abdominal pain as well as nausea without vomiting. There is been some radiation to the back. Bowel movements are normal. No urinary symptoms.  He has seen Dr. Russella Dar for evaluation and EGD was performed which was negative .  A small hiatal hernia was noted. Subsequently a HIDA scan was obtained which shows a reduced ejection fraction of 9%. He is also recently had a cardiac evaluation by Dr. Tedra Senegal as his initial symptoms were similar to his previous heart attack and verbal report per patient and his family is this was all negative and he has been cleared for potential surgery.  Past Medical History  Diagnosis Date  . CAD (coronary artery disease)     a. 1990 CABGx3: LIMA->LAD, VG->OM, VG->dRCA;  b. 05/2007 VF Arrest/Cath/PCI: 100 VG->OM tx w/ BMS, 95 VG->RCA tx w/ bms;  c. 06/2007 90 Native PDA tx w DES ;  d. 05/2009 Echo EF 60-65%, nl WM, mild MR; e. normal lexiscan myoview 03/30/12  . HTN (hypertension)   . Hypercholesteremia   . Diabetes mellitus   . Atrial fibrillation     a. had been on tikosyn -> d/c 09/2010;  b. Was on Amio ->d/c 09/2011;  c.   anticoagulated w/ Pradaxa  . Junctional ectopic tachycardia     a. noted 09/2010  . Sinus bradycardia   . CKD (chronic kidney disease), stage III   . Back pain     a. Followed by [facility] - on prednisone  . Prostate cancer   . Unsteady gait     a. in rehab for core strengthening  . Colitis   . Tubular adenoma of colon 02/2007  . Myocardial infarction   . Arthritis   . CHF (congestive heart failure)    Past Surgical History  Procedure Date  . Coronary artery bypass graft 1990  . Prostate surgery 01/1992    retropubic prostatectomy  . Cardiac valve surgery   . Coronary angioplasty with stent placement 2008    x 3   . Rt finger trigger a-1 pulley    Current Outpatient Prescriptions  Medication Sig Dispense Refill  . aspirin 81 MG tablet Take 81 mg by mouth every other day.       . dabigatran (PRADAXA) 150 MG CAPS Take 150 mg by mouth daily. Once to twice daily      . donepezil (ARICEPT) 10 MG tablet Take 10 mg by mouth at bedtime as needed.      . finasteride (PROSCAR) 5 MG tablet Take 5 mg by mouth Daily.       . furosemide (LASIX) 20 MG tablet Take 20 mg by mouth daily.      Marland Kitchen  glipiZIDE (GLUCOTROL) 10 MG tablet Take 10 mg by mouth daily.        Marland Kitchen HYDROcodone-acetaminophen (VICODIN) 5-500 MG per tablet Take 1 tablet by mouth every 6 (six) hours as needed for pain.  30 tablet  0  . lansoprazole (PREVACID) 30 MG capsule Take 1 capsule (30 mg total) by mouth daily.  30 capsule  11  . levothyroxine (SYNTHROID, LEVOTHROID) 75 MCG tablet Take 75 mcg by mouth daily.      Marland Kitchen lisinopril (PRINIVIL,ZESTRIL) 10 MG tablet Take 10 mg by mouth daily.      . Multiple Vitamin (MULTIVITAMIN) tablet Take 1 tablet by mouth daily.        . nitroGLYCERIN (NITROSTAT) 0.4 MG SL tablet Place 0.4 mg under the tongue every 5 (five) minutes as needed. For chest pain      . potassium chloride (K-DUR,KLOR-CON) 10 MEQ tablet Take 10 mEq by mouth daily.      . promethazine (PHENERGAN) 12.5 MG tablet Take 1 tablet  (12.5 mg total) by mouth every 6 (six) hours as needed for nausea.  30 tablet  0  . rosuvastatin (CRESTOR) 10 MG tablet Take 10 mg by mouth daily.      . sertraline (ZOLOFT) 100 MG tablet Take 100 mg by mouth daily.      . traMADol (ULTRAM) 50 MG tablet Take 1 tablet (50 mg total) by mouth every 6 (six) hours as needed for pain.  30 tablet  0  . DISCONTD: rivastigmine (EXELON) 4.6 mg/24hr Place 1 patch onto the skin daily.       No Known Allergies   Review of Systems  Respiratory: Negative.   Cardiovascular: Negative.   Gastrointestinal: Positive for nausea, abdominal pain and constipation. Negative for diarrhea, blood in stool and anal bleeding.  Genitourinary: Negative.        Objective:   Physical Exam General: Alert, well-developed elderly Caucasian male, in no distress Skin: Warm and dry without rash or infection. HEENT: No palpable masses or thyromegaly. Sclera nonicteric. Pupils equal round and reactive. Oropharynx clear. Lymph nodes: No cervical, supraclavicular, or inguinal nodes palpable. Lungs: Breath sounds clear and equal without increased work of breathing Cardiovascular: Regular rate and rhythm without murmur. No JVD or edema. Well healed sternotomy Abdomen: Nondistended. Moderate epigastric and RUQ tenderness. No masses palpable. No organomegaly. No palpable hernias. Extremities: No edema or joint swelling or deformity. No chronic venous stasis changes. Neurologic: Alert and fully oriented. Gait normal.     Assessment:     Very likely symptomatic gallbladder disease. He has gallstones seen on CT scan and an abnormal HIDA scan. No other cause for abdominal pain identified on endoscopy or CT.  Pulmonary nodule. Workup is in progress.  Coronary artery disease. On chronic anticoagulation. We have verbal clearance and we'll go ahead with scheduling and obtain written clearance.    Plan:     I've recommended proceeding with laparoscopic cholecystectomy and  cholangiogram in an effort to relieve his symptoms and prevent complications from his gallstones.I discussed the procedure in detail.  The patient was given Agricultural engineer.  We discussed the risks and benefits of a laparoscopic cholecystectomy and possible cholangiogram including, but not limited to bleeding, infection, injury to surrounding structures such as the intestine or liver, bile leak, retained gallstones, need to convert to an open procedure, prolonged diarrhea, blood clots such as  DVT, common bile duct injury, anesthesia risks, and possible need for additional procedures.  The likelihood of improvement in symptoms and  return to the patient's normal status is good. We discussed the typical post-operative recovery course.

## 2012-05-05 NOTE — Preoperative (Signed)
Beta Blockers   Reason not to administer Beta Blockers:Not Applicable,  

## 2012-05-05 NOTE — Progress Notes (Signed)
Patient admitted to 6N 18. Dx: s/p lap chole. Abdominal sites x4 unremarkable. Level of care ordered telemetry. Patient placed on telemetry. Admission strip reviewed. Sinus rhythm with occcassional PAC's and PVC's. Oreinted to room. Call bell in reach. Family at bedside.

## 2012-05-05 NOTE — Anesthesia Preprocedure Evaluation (Addendum)
Anesthesia Evaluation  Patient identified by MRN, date of birth, ID band Patient awake    Reviewed: Allergy & Precautions, H&P , NPO status , Patient's Chart, lab work & pertinent test results  History of Anesthesia Complications Negative for: history of anesthetic complications  Airway Mallampati: II TM Distance: >3 FB Neck ROM: Full    Dental  (+) Edentulous Upper and Edentulous Lower   Pulmonary former smoker,  breath sounds clear to auscultation        Cardiovascular hypertension, Pt. on medications + CAD, + Past MI, + Cardiac Stents, + CABG and +CHF + dysrhythmias (Vfib arrest after card cath with stent; pt has been noted to have junctional and brady arrhythmias) Atrial Fibrillation and Ventricular Fibrillation Rhythm:Irregular Rate:Normal  Lexiscan myoview on 03/30/12 revealed no ischemia, infarction, or segmental wall motion abnormalities, EF 53%.  Dr. Riley Kill is aware of planned procedure and feels patient is "moderately high risk for surgery."  (See telephone encounter from 05/01/12.)  EKG on 04/27/12 showed SR, non-specific intraventricular conduction delay, non-specific ST abnormality.  Echo on 05/31/12 showed: 1. Left ventricle: There was moderate concentric hypertrophy. Systolic function was normal. The estimated ejection fraction was in the range of 60% to 65%. Wall motion was normal; there were no regional wall motion abnormalities. 2. Mitral valve: Mild regurgitation. 3. Left atrium: The atrium was moderately dilated. Impressions: - Technically difficult study due to poor sound wave transmission.  Cardiac cath on 06/03/07 showed: 1. Total occlusion of the saphenous vein graft to the obtuse marginal with successful reperfusion therapy using a 3.0 x 23 non drug- eluting stent, and distal protection.   2. Continued patency of the internal mammary to the LAD with a diffusely diseased LAD.   3. Continued patency of the saphenous  vein graft to the distal right with high-grade saphenous vein graft disease. (s/p Guidant Ultra stent on 06/10/07.) 4. Known total occlusion of the circumflex, right coronary artery and left anterior descending arteries.      Neuro/Psych PSYCHIATRIC DISORDERS Depression alzheimer's  negative neurological ROS     GI/Hepatic Neg liver ROS, GERD-  Medicated,  Endo/Other  Type 2, Oral Hypoglycemic AgentsHypothyroidism   Renal/GU Renal InsufficiencyRenal disease   Prostate CA s/p sx    Musculoskeletal   Abdominal (+) + obese,   Peds  Hematology  (+) Blood dyscrasia (LD Pradaxa 05/02/12), ,   Anesthesia Other Findings   Reproductive/Obstetrics                       Anesthesia Physical Anesthesia Plan  ASA: III  Anesthesia Plan: General   Post-op Pain Management:    Induction: Intravenous  Airway Management Planned: Oral ETT  Additional Equipment:   Intra-op Plan:   Post-operative Plan: Extubation in OR  Informed Consent: I have reviewed the patients History and Physical, chart, labs and discussed the procedure including the risks, benefits and alternatives for the proposed anesthesia with the patient or authorized representative who has indicated his/her understanding and acceptance.   Dental advisory given  Plan Discussed with: CRNA  Anesthesia Plan Comments:         Anesthesia Quick Evaluation

## 2012-05-05 NOTE — Transfer of Care (Signed)
Immediate Anesthesia Transfer of Care Note  Patient: Shawn [name]  Procedure(s) Performed: Procedure(s) (LRB): LAPAROSCOPIC CHOLECYSTECTOMY WITH INTRAOPERATIVE CHOLANGIOGRAM (N/A)  Patient Location: PACU  Anesthesia Type: General  Level of Consciousness: awake, alert  and patient cooperative  Airway & Oxygen Therapy: Patient Spontanous Breathing and Patient connected to face mask oxygen  Post-op Assessment: Report given to PACU RN and Post -op Vital signs reviewed and stable  Post vital signs: Reviewed and stable  Complications: No apparent anesthesia complications

## 2012-05-05 NOTE — Telephone Encounter (Signed)
I spoke with the pt's son and he said the pt had his surgery today and everything went well. The pt's son said that Dr Riley Kill had mentioned he may switch the pt's pradaxa to a different medication but the pt's son was unsure if these needed to be done now or if this would be something done in the future.  I will forward this message to Dr Riley Kill for further review.

## 2012-05-05 NOTE — Anesthesia Postprocedure Evaluation (Signed)
  Anesthesia Post-op Note  Patient: Shawn [name]  Procedure(s) Performed: Procedure(s) (LRB): LAPAROSCOPIC CHOLECYSTECTOMY WITH INTRAOPERATIVE CHOLANGIOGRAM (N/A)  Patient Location: PACU  Anesthesia Type: General  Level of Consciousness: awake  Airway and Oxygen Therapy: Patient Spontanous Breathing and Patient connected to face mask oxygen  Post-op Pain: mild  Post-op Assessment: Post-op Vital signs reviewed, Patient's Cardiovascular Status Stable, Respiratory Function Stable, Patent Airway and No signs of Nausea or vomiting  Post-op Vital Signs: Reviewed and stable  Complications: No apparent anesthesia complications

## 2012-05-06 ENCOUNTER — Encounter (HOSPITAL_COMMUNITY): Payer: Self-pay | Admitting: General Practice

## 2012-05-06 LAB — GLUCOSE, CAPILLARY: Glucose-Capillary: 137 mg/dL — ABNORMAL HIGH (ref 70–99)

## 2012-05-06 LAB — BASIC METABOLIC PANEL
CO2: 26 mEq/L (ref 19–32)
Chloride: 103 mEq/L (ref 96–112)
Glucose, Bld: 163 mg/dL — ABNORMAL HIGH (ref 70–99)
Potassium: 4.8 mEq/L (ref 3.5–5.1)
Sodium: 138 mEq/L (ref 135–145)

## 2012-05-06 LAB — CBC
HCT: 40.4 % (ref 39.0–52.0)
Hemoglobin: 13.1 g/dL (ref 13.0–17.0)
MCH: 27.8 pg (ref 26.0–34.0)
MCV: 85.6 fL (ref 78.0–100.0)
Platelets: 192 10*3/uL (ref 150–400)
RBC: 4.72 MIL/uL (ref 4.22–5.81)
WBC: 13.4 10*3/uL — ABNORMAL HIGH (ref 4.0–10.5)

## 2012-05-06 MED ORDER — OXYCODONE-ACETAMINOPHEN 5-325 MG PO TABS: 1.0000 | ORAL_TABLET | ORAL | Status: DC | PRN

## 2012-05-06 NOTE — Progress Notes (Signed)
Monitor tech called to report multi-focal PVC's. Went to patient's room to check on patient. Patient asleep. Awoke to check for chest pain or SOB, patient denies SOB and chest pain. Dr. Janee [name] made aware of cardiac rhythm change with patient in no acute distress at 0540. No new orders given. Will continue to monitor.

## 2012-05-06 NOTE — Progress Notes (Signed)
Patient was discharged this morning at 10:45. Home care instructions gone over with patient. Home medications gone over and patient verbalized a review of taking nitroglycerin. Incisional care gone over. Follow up appointments gone over, also signs and symptoms of infection and or when to call the doctor. Patient verbalized understanding of instructions.

## 2012-05-06 NOTE — Discharge Summary (Signed)
   Patient ID: SAHAS SLUKA [id number] 78 y.o. 1927-12-02  05/05/2012  Discharge date and time: 05/06/2012   Admitting Physician: Glenna Fellows T  Discharge Physician: Glenna Fellows T  Admission Diagnoses: gallstones  Discharge Diagnoses: Same  Operations: Procedure(s): LAPAROSCOPIC CHOLECYSTECTOMY WITH INTRAOPERATIVE CHOLANGIOGRAM  Admission Condition: fair  Discharged Condition: good  Indication for Admission: Pt with recurrent episodic RUQ abd pain.  Gallstones on W/U  Hospital Course: Uneventful lap chole.  POD 1 minimal discomfort, VSS.  Abd soft and non tender.  Hbg nl  Disposition: Home  Patient Instructions:   Jameer, Storie  Home Medication Instructions [id number]   Printed on:05/06/12 0820  Medication Information                    aspirin 81 MG tablet Take 81 mg by mouth every other day.            glipiZIDE (GLUCOTROL) 10 MG tablet Take 10 mg by mouth daily.             finasteride (PROSCAR) 5 MG tablet Take 5 mg by mouth Daily.            Multiple Vitamin (MULTIVITAMIN) tablet Take 1 tablet by mouth daily.             levothyroxine (SYNTHROID, LEVOTHROID) 75 MCG tablet Take 75 mcg by mouth daily.           rosuvastatin (CRESTOR) 10 MG tablet Take 10 mg by mouth daily.           lansoprazole (PREVACID) 30 MG capsule Take 1 capsule (30 mg total) by mouth daily.           dabigatran (PRADAXA) 150 MG CAPS Take 150 mg by mouth daily.            nitroGLYCERIN (NITROSTAT) 0.4 MG SL tablet Place 0.4 mg under the tongue every 5 (five) minutes as needed. For chest pain           furosemide (LASIX) 20 MG tablet Take 20 mg by mouth daily.           potassium chloride (K-DUR,KLOR-CON) 10 MEQ tablet Take 10 mEq by mouth daily.           lisinopril (PRINIVIL,ZESTRIL) 10 MG tablet Take 10 mg by mouth daily.           sertraline (ZOLOFT) 100 MG tablet Take 100 mg by mouth daily.           ondansetron (ZOFRAN) 4 MG tablet Take 4 mg by  mouth every 8 (eight) hours as needed. For nausea and vomiting           HYDROcodone-acetaminophen (VICODIN) 5-500 MG per tablet Take 1 tablet by mouth every 6 (six) hours as needed. For pain           promethazine (PHENERGAN) 12.5 MG tablet Take 12.5 mg by mouth every 6 (six) hours as needed. For nausea and vomiting           oxyCODONE-acetaminophen (PERCOCET/ROXICET) 5-325 MG per tablet Take 1-2 tablets by mouth every 4 (four) hours as needed.             Activity: activity as tolerated Diet: regular diet Wound Care: none needed  Follow-up:  With Dr Johna Sheriff in 2 weeks.  Signed: Mariella Saa MD, FACS  05/06/2012, 8:20 AM

## 2012-05-06 NOTE — Telephone Encounter (Signed)
Patient should hold on new med until we see him back in the next few days, next week preferably.  He would be a candidate for Xarelto, but as I explained to his son, we would like several days post op  (one week) and surgical clearance to start.

## 2012-05-07 ENCOUNTER — Encounter (HOSPITAL_COMMUNITY): Payer: Self-pay | Admitting: General Surgery

## 2012-05-11 ENCOUNTER — Ambulatory Visit (INDEPENDENT_AMBULATORY_CARE_PROVIDER_SITE_OTHER): Payer: Medicare Other | Admitting: General Surgery

## 2012-05-11 ENCOUNTER — Telehealth (INDEPENDENT_AMBULATORY_CARE_PROVIDER_SITE_OTHER): Payer: Self-pay

## 2012-05-11 ENCOUNTER — Telehealth (INDEPENDENT_AMBULATORY_CARE_PROVIDER_SITE_OTHER): Payer: Self-pay | Admitting: General Surgery

## 2012-05-11 ENCOUNTER — Other Ambulatory Visit (INDEPENDENT_AMBULATORY_CARE_PROVIDER_SITE_OTHER): Payer: Self-pay

## 2012-05-11 ENCOUNTER — Ambulatory Visit
Admission: RE | Admit: 2012-05-11 | Discharge: 2012-05-11 | Disposition: A | Discharge status: Home / Self Care | Payer: Medicare Other | Source: Ambulatory Visit | Attending: General Surgery | Admitting: General Surgery

## 2012-05-11 DIAGNOSIS — R109 Unspecified abdominal pain: Secondary | ICD-10-CM

## 2012-05-11 DIAGNOSIS — R110 Nausea: Secondary | ICD-10-CM

## 2012-05-11 DIAGNOSIS — Z9049 Acquired absence of other specified parts of digestive tract: Secondary | ICD-10-CM

## 2012-05-11 DIAGNOSIS — Z09 Encounter for follow-up examination after completed treatment for conditions other than malignant neoplasm: Secondary | ICD-10-CM

## 2012-05-11 LAB — COMPREHENSIVE METABOLIC PANEL
Albumin: 3.6 g/dL (ref 3.5–5.2)
BUN: 18 mg/dL (ref 6–23)
CO2: 25 mEq/L (ref 19–32)
Calcium: 9 mg/dL (ref 8.4–10.5)
Chloride: 104 mEq/L (ref 96–112)
Creat: 1.48 mg/dL — ABNORMAL HIGH (ref 0.50–1.35)
Glucose, Bld: 96 mg/dL (ref 70–99)
Potassium: 4.4 mEq/L (ref 3.5–5.3)

## 2012-05-11 LAB — CBC WITH DIFFERENTIAL/PLATELET
Basophils Absolute: 0 10*3/uL (ref 0.0–0.1)
Eosinophils Relative: 6 % — ABNORMAL HIGH (ref 0–5)
HCT: 41.1 % (ref 39.0–52.0)
Hemoglobin: 13.8 g/dL (ref 13.0–17.0)
Lymphocytes Relative: 25 % (ref 12–46)
MCV: 83.5 fL (ref 78.0–100.0)
Monocytes Absolute: 0.7 10*3/uL (ref 0.1–1.0)
Monocytes Relative: 7 % (ref 3–12)
Neutro Abs: 5.4 10*3/uL (ref 1.7–7.7)
RDW: 14.4 % (ref 11.5–15.5)
WBC: 8.8 10*3/uL (ref 4.0–10.5)

## 2012-05-11 LAB — LIPASE: Lipase: 18 U/L (ref 0–75)

## 2012-05-11 NOTE — Telephone Encounter (Signed)
Call the patient and discussed lab work results

## 2012-05-11 NOTE — Progress Notes (Signed)
History: Patient returns to the office 5 days following an apparently uncomplicated laparoscopic cholecystectomy. He and his family called this morning stating he was not feeling well and I asked him to come in and have some lab work and x-rays done and be checked. His main problem is persistent low-grade nausea. He has not had vomiting. He however has very poor appetite and is belching. He has been drinking fluids but everything tastes "bitter". He is having pain which seems to be mainly around his supraumbilical port site. He denies fever or chills. He has been passing gas and has had a couple of small bowel movements.  Exam: Temp 97.4 respirations 18   HR 66   BP 138/60  Gen.: Appears a little fatigued but not acutely ill. HEENT: Sclera nonicteric Lungs: Clear equal breath sounds without increased work of breathing Abdomen: Not noticeably distended. There is some bruising around the trocar sites. There is a little tenderness and thickening around the bruise at his supraumbilical port site consistent with a small hematoma. No evidence of hernia  Flat and upright abdominal x-ray reviewed:  Slight increase in small and large bowel gas C/W mild ileus  Lab: Currently pending  Assessment and plan: Nausea and discomfort post laparoscopic cholecystectomy but seems most consistent with ileus. His abdomen is quite benign. I'm going to followup on his lab work and call. I think he is okay to go home and observe closely. He is tolerating fluids well he will continue liquids only until his nausea resolved. I suggested trying to back off the narcotics and using Phenergan as needed for nausea. They will call if he is feeling any worse.

## 2012-05-11 NOTE — Patient Instructions (Addendum)
Call if feeling any worse. I will call her with lab results. Sip liquids and avoid solid foods until feeling better. Try to avoid narcotic pain medicine if possible.

## 2012-05-11 NOTE — Telephone Encounter (Signed)
The patient's son called to report the patient is having more pain and nausea.  They wonder if this is normal.   He says it feels like before surgery.  He has Percocet, Promethazine and Zofran.  He has a lot of nausea, pain, no appetite, and belching.  He has no fever and he can't vomit.  The pain seems worse.  I paged Dr Johna Sheriff. He thinks he should be seen today in the urgent clinic.  He ordered a CBC, CMET, Lipase, and flat and upright xray of the abdomen.  I made an appointment for 4:45pm and I will notify the son of the orders for labs and xray.  Dr Johna Sheriff states he may be able to see the pt if he is available.

## 2012-05-12 NOTE — Telephone Encounter (Signed)
I spoke with the pt's son and scheduled the pt to see Dr Riley Kill on 05/14/12 at 2:00.

## 2012-05-13 ENCOUNTER — Ambulatory Visit (INDEPENDENT_AMBULATORY_CARE_PROVIDER_SITE_OTHER): Payer: Medicare Other | Admitting: Surgery

## 2012-05-14 ENCOUNTER — Encounter: Payer: Self-pay | Admitting: Cardiology

## 2012-05-14 ENCOUNTER — Ambulatory Visit (INDEPENDENT_AMBULATORY_CARE_PROVIDER_SITE_OTHER): Payer: Medicare Other | Admitting: Cardiology

## 2012-05-14 VITALS — BP 150/74 | HR 78 | Ht 71.0 in | Wt 210.0 lb

## 2012-05-14 DIAGNOSIS — E7800 Pure hypercholesterolemia, unspecified: Secondary | ICD-10-CM

## 2012-05-14 DIAGNOSIS — I4891 Unspecified atrial fibrillation: Secondary | ICD-10-CM

## 2012-05-14 DIAGNOSIS — I25810 Atherosclerosis of coronary artery bypass graft(s) without angina pectoris: Secondary | ICD-10-CM

## 2012-05-14 DIAGNOSIS — I2510 Atherosclerotic heart disease of native coronary artery without angina pectoris: Secondary | ICD-10-CM | POA: Insufficient documentation

## 2012-05-14 LAB — CBC WITH DIFFERENTIAL/PLATELET
Basophils Relative: 0.4 % (ref 0.0–3.0)
Eosinophils Absolute: 0.8 10*3/uL — ABNORMAL HIGH (ref 0.0–0.7)
Eosinophils Relative: 7.2 % — ABNORMAL HIGH (ref 0.0–5.0)
Hemoglobin: 14.5 g/dL (ref 13.0–17.0)
Lymphocytes Relative: 27.4 % (ref 12.0–46.0)
MCHC: 32.8 g/dL (ref 30.0–36.0)
Monocytes Relative: 7.7 % (ref 3.0–12.0)
Neutro Abs: 6.2 10*3/uL (ref 1.4–7.7)
Neutrophils Relative %: 57.3 % (ref 43.0–77.0)
RBC: 5.09 Mil/uL (ref 4.22–5.81)
WBC: 10.9 10*3/uL — ABNORMAL HIGH (ref 4.5–10.5)

## 2012-05-14 LAB — BASIC METABOLIC PANEL
BUN: 15 mg/dL (ref 6–23)
CO2: 25 mEq/L (ref 19–32)
Chloride: 103 mEq/L (ref 96–112)
Potassium: 3.9 mEq/L (ref 3.5–5.1)

## 2012-05-14 MED ORDER — RIVAROXABAN 15 MG PO TABS: 15.0000 mg | ORAL_TABLET | Freq: Every day | ORAL | Status: DC

## 2012-05-14 NOTE — Progress Notes (Signed)
HPI:  Patient is in for followup. He underwent cholecystectomy. He's done well but did develop nausea at the end of the week, and follow up x-rays were suggestive of residual gas and air. He was seen by Dr. Johna Sheriff. He's been much better the past couple of days. He denies any ongoing chest pain. He did last dose of Pradaxa yesterday.  He know feels better.      Current Outpatient Prescriptions  Medication Sig Dispense Refill  . aspirin 81 MG tablet Take 81 mg by mouth every other day.       . finasteride (PROSCAR) 5 MG tablet Take 5 mg by mouth Daily.       . furosemide (LASIX) 20 MG tablet Take 20 mg by mouth daily.      Marland Kitchen glipiZIDE (GLUCOTROL) 10 MG tablet Take 10 mg by mouth daily.        . lansoprazole (PREVACID) 30 MG capsule Take 1 capsule (30 mg total) by mouth daily.  30 capsule  11  . levothyroxine (SYNTHROID, LEVOTHROID) 75 MCG tablet Take 75 mcg by mouth daily.      Marland Kitchen lisinopril (PRINIVIL,ZESTRIL) 10 MG tablet Take 10 mg by mouth daily.      . Multiple Vitamin (MULTIVITAMIN) tablet Take 1 tablet by mouth daily.        . nitroGLYCERIN (NITROSTAT) 0.4 MG SL tablet Place 0.4 mg under the tongue every 5 (five) minutes as needed. For chest pain      . ondansetron (ZOFRAN) 4 MG tablet Take 4 mg by mouth every 8 (eight) hours as needed. For nausea and vomiting      . potassium chloride (K-DUR,KLOR-CON) 10 MEQ tablet Take 10 mEq by mouth daily.      . rosuvastatin (CRESTOR) 10 MG tablet Take 10 mg by mouth daily.      . sertraline (ZOLOFT) 100 MG tablet Take 100 mg by mouth daily.      . promethazine (PHENERGAN) 12.5 MG tablet Take 12.5 mg by mouth every 6 (six) hours as needed. For nausea and vomiting      . Rivaroxaban (XARELTO) 15 MG TABS tablet Take 1 tablet (15 mg total) by mouth daily.  30 tablet  6  . DISCONTD: rivastigmine (EXELON) 4.6 mg/24hr Place 1 patch onto the skin daily.        No Known Allergies  Past Medical History  Diagnosis Date  . CAD (coronary artery disease)       a. 1990 CABGx3: LIMA->LAD, VG->OM, VG->dRCA;  b. 05/2007 VF Arrest/Cath/PCI: 100 VG->OM tx w/ BMS, 95 VG->RCA tx w/ bms;  c. 06/2007 90 Native PDA tx w DES ;  d. 05/2009 Echo EF 60-65%, nl WM, mild MR; e. normal lexiscan myoview 03/30/12  . HTN (hypertension)   . Hypercholesteremia   . Diabetes mellitus   . Atrial fibrillation     a. had been on tikosyn -> d/c 09/2010;  b. Was on Amio ->d/c 09/2011;  c.  anticoagulated w/ Pradaxa  . Junctional ectopic tachycardia     a. noted 09/2010  . Sinus bradycardia   . CKD (chronic kidney disease), stage III   . Back pain     a. Followed by [facility] - on prednisone  . Prostate cancer   . Unsteady gait     a. in rehab for core strengthening  . Colitis   . Tubular adenoma of colon 02/2007  . Myocardial infarction   . Arthritis   . CHF (congestive heart failure)   .  Depression     Past Surgical History  Procedure Date  . Coronary artery bypass graft 1990  . Prostate surgery 01/1992    retropubic prostatectomy  . Cardiac valve surgery   . Coronary angioplasty with stent placement 2008    x 3   . Rt finger trigger a-1 pulley   . Cholecystectomy 05/05/12  . Cholecystectomy 05/05/2012    Procedure: LAPAROSCOPIC CHOLECYSTECTOMY WITH INTRAOPERATIVE CHOLANGIOGRAM;  Surgeon: Mariella Saa, MD;  Location: [facility] OR;  Service: General;  Laterality: N/A;    Family History  Problem Relation Age of Onset  . Other Father     died of unknown cause @ young age  . Coronary artery disease Mother     died in late 61's w/ "hardening of the arteries"  . Heart disease Mother   . Heart disease Son   . Diabetes Son     History   Social History  . Marital Status: Widowed    Spouse Name: N/A    Number of Children: 4  . Years of Education: N/A   Occupational History  . 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 Smoking status: Former Smoker -- 40 years    Quit date: 09/23/1988  . Smokeless tobacco: Never Used   Comment: quit 20+ years ago  .  Alcohol Use: No  . Drug Use: No  . Sexually Active: No   Other Topics Concern  . Not on file   Social History Narrative   The patient resides in [place] alone.  He is widowed.  He has 4 children, 9 grandchildren.  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  He denies any alcohol, drugs, or herbal medication.  He tries  to maintain a low-fat diet.  He states that he does exercise somewhat with walking, and he uses a stationary bike for a few minutes every other day.       ROS: Please see the HPI.  All other systems reviewed and negative.  PHYSICAL EXAM:  BP 150/74  Pulse 78  Ht 5\' 11"  (1.803 m)  Wt 210 lb (95.255 kg)  BMI 29.29 kg/m2  General: Well developed, well nourished, in no acute distress.  Slightly pale.  Head:  Normocephalic and atraumatic. Neck: no JVD Lungs: Clear to auscultation and percussion. Heart: Normal S1 and S2.  No murmur, rubs or gallops.  Abdomen:  Normal bowel sounds; soft; non tender; no organomegaly.  Recent surgical sites.   Pulses: Pulses normal in all 4 extremities. Extremities: No clubbing or cyanosis. No edema. Neurologic: Alert and oriented x 3.  EKG:  NSR.  WNL.   ASSESSMENT AND PLAN:

## 2012-05-14 NOTE — Patient Instructions (Addendum)
Your physician has recommended you make the following change in your medication: STOP Pradaxa, START Xarelto 15mg  take one by mouth daily  Your physician recommends that you have lab work today: BMP and CBC  Your physician recommends that you schedule a follow-up appointment in: 3 WEEKS

## 2012-05-14 NOTE — Assessment & Plan Note (Signed)
Remains on lipid lowering therapy.

## 2012-05-14 NOTE — Assessment & Plan Note (Signed)
The patient has had no recurrent symptoms. Overall he is doing well. We'll continue to follow medically.

## 2012-05-14 NOTE — Assessment & Plan Note (Signed)
The patient is currently in normal sinus rhythm. We're in the process of switching his pradaxa to xarelto.  He will take 15mg  per day starting with the evening meal, with his most recent CR clearance of 38.  I have discussed possible side effects, and he will follow with me in two weeks.

## 2012-05-15 ENCOUNTER — Telehealth: Payer: Self-pay | Admitting: *Deleted

## 2012-05-15 NOTE — Telephone Encounter (Signed)
Message copied by Tarri Fuller on Fri May 15, 2012  8:16 AM ------      Message from: Shawnie Pons D      Created: Fri May 15, 2012  6:37 AM       Let patient know these look good.  Thanks.  TS

## 2012-05-15 NOTE — Telephone Encounter (Signed)
pt notified of lab results and gave me verbal understanding

## 2012-05-26 ENCOUNTER — Ambulatory Visit: Payer: Medicare Other | Admitting: Neurology

## 2012-05-28 ENCOUNTER — Encounter (INDEPENDENT_AMBULATORY_CARE_PROVIDER_SITE_OTHER): Payer: Medicare Other | Admitting: General Surgery

## 2012-05-28 ENCOUNTER — Encounter: Payer: Self-pay | Admitting: Neurology

## 2012-05-28 ENCOUNTER — Ambulatory Visit (INDEPENDENT_AMBULATORY_CARE_PROVIDER_SITE_OTHER): Payer: Medicare Other | Admitting: Neurology

## 2012-05-28 VITALS — BP 132/64 | HR 70 | Wt 213.0 lb

## 2012-05-28 DIAGNOSIS — F068 Other specified mental disorders due to known physiological condition: Secondary | ICD-10-CM

## 2012-05-28 DIAGNOSIS — G309 Alzheimer's disease, unspecified: Secondary | ICD-10-CM

## 2012-05-28 DIAGNOSIS — F0280 Dementia in other diseases classified elsewhere without behavioral disturbance: Secondary | ICD-10-CM

## 2012-05-28 MED ORDER — DONEPEZIL HCL 10 MG PO TABS: 10.0000 mg | ORAL_TABLET | Freq: Every day | ORAL | Status: DC

## 2012-05-28 NOTE — Progress Notes (Signed)
Dear Dr. Jacky Kindle,   I saw Shawn [name] back in [facility] for his problem with dementia likely Alzheimer's type complicated by depression. As you may recall, he is a 78 y.o. 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  Since I last saw him he has switched from Aricept to [facility] because of issues around abdominal pain with the Aricept.  However, it turns out he actually has gall bladder problems and had a resection of his gall bladder which made him feel much better.  He has just started the [place], but importantly it is costing him 200$ per month.    His depression is about the same.  He was started on sertraline by Dr. Awilda Metro -- but has decided to not go back to him because he did not think it was helping.  Medical history, social history, and family history were reviewed and have not changed since the last clinic visit.  Current Outpatient Prescriptions on File Prior to Visit  Medication Sig Dispense Refill  . aspirin 81 MG tablet Take 81 mg by mouth every other day.       . finasteride (PROSCAR) 5 MG tablet Take 5 mg by mouth Daily.       . furosemide (LASIX) 20 MG tablet Take 20 mg by mouth daily.      Marland Kitchen glipiZIDE (GLUCOTROL) 10 MG tablet Take 10 mg by mouth daily.        . lansoprazole (PREVACID) 30 MG capsule Take 1 capsule (30 mg total) by mouth daily.  30 capsule  11  . levothyroxine (SYNTHROID, LEVOTHROID) 75 MCG tablet Take 75 mcg by mouth daily.      Marland Kitchen lisinopril (PRINIVIL,ZESTRIL) 10 MG tablet Take 10 mg by mouth daily.      . Multiple Vitamin (MULTIVITAMIN) tablet Take 1 tablet by mouth daily.        . nitroGLYCERIN (NITROSTAT) 0.4 MG SL tablet Place 0.4 mg under the tongue every 5 (five) minutes as needed. For chest pain      . potassium chloride (K-DUR,KLOR-CON) 10 MEQ tablet Take 10 mEq by mouth daily.      . Rivaroxaban (XARELTO) 15 MG TABS tablet Take 1 tablet (15 mg total) by mouth daily.  30  tablet  6  . rosuvastatin (CRESTOR) 10 MG tablet Take 10 mg by mouth daily.      . ondansetron (ZOFRAN) 4 MG tablet Take 4 mg by mouth every 8 (eight) hours as needed. For nausea and vomiting      . promethazine (PHENERGAN) 12.5 MG tablet Take 12.5 mg by mouth every 6 (six) hours as needed. For nausea and vomiting      . sertraline (ZOLOFT) 100 MG tablet Take 100 mg by mouth daily.      Marland Kitchen DISCONTD: rivastigmine (EXELON) 4.6 mg/24hr Place 1 patch onto the skin daily.        No Known Allergies  ROS:  13 systems were reviewed and are notable for resolved abdominal pain.  All other review of systems are unremarkable.  Exam: . Filed Vitals:   05/28/12 1130  BP: 132/64  Pulse: 70  Weight: 213 lb (96.616 kg)    In general, well appearing man.  Mental status:   MMSE 4/5 time; 5/5 place; 2/3 3 word recall.  Gait:  Mildly wide based gait, but turns well.  Impression/Recommendations:  1.  Possible Alzheimer's dementia - because of cost he is going to finish the  Namenda 5mg  bid and then switch back to the Aricept 10mg  as it was likely not causing his abdominal pain. 2.  Depression - I think this will be an ongoing struggle with him.  He may benefit from increase sertraline but I will leave this up to you.  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  Lupita Raider Modesto Charon, MD [facility], [place]

## 2012-05-29 ENCOUNTER — Other Ambulatory Visit (HOSPITAL_COMMUNITY): Payer: Self-pay | Admitting: Internal Medicine

## 2012-05-29 DIAGNOSIS — C61 Malignant neoplasm of prostate: Secondary | ICD-10-CM

## 2012-06-02 ENCOUNTER — Ambulatory Visit: Payer: Medicare Other | Admitting: Cardiology

## 2012-06-03 ENCOUNTER — Ambulatory Visit: Payer: Medicare Other | Admitting: Cardiology

## 2012-06-10 ENCOUNTER — Ambulatory Visit (INDEPENDENT_AMBULATORY_CARE_PROVIDER_SITE_OTHER): Payer: Medicare Other | Admitting: Cardiology

## 2012-06-10 ENCOUNTER — Encounter: Payer: Self-pay | Admitting: Cardiology

## 2012-06-10 VITALS — BP 132/72 | HR 78 | Ht 71.0 in | Wt 212.8 lb

## 2012-06-10 DIAGNOSIS — I4891 Unspecified atrial fibrillation: Secondary | ICD-10-CM

## 2012-06-10 DIAGNOSIS — I2510 Atherosclerotic heart disease of native coronary artery without angina pectoris: Secondary | ICD-10-CM

## 2012-06-10 DIAGNOSIS — M549 Dorsalgia, unspecified: Secondary | ICD-10-CM

## 2012-06-10 DIAGNOSIS — I25810 Atherosclerosis of coronary artery bypass graft(s) without angina pectoris: Secondary | ICD-10-CM

## 2012-06-10 NOTE — Patient Instructions (Addendum)
Your physician recommends that you schedule a follow-up appointment in: 2 MONTHS  Your physician recommends that you continue on your current medications as directed. Please refer to the Current Medication list given to you today.   

## 2012-06-11 ENCOUNTER — Other Ambulatory Visit (HOSPITAL_COMMUNITY): Payer: Medicare Other

## 2012-06-13 NOTE — Progress Notes (Signed)
HPI:   The patient returns in followup. He was perhaps a little bit better. He underwent cholecystectomy that was successful. He is now starting to improve to some extent. He has resumed his anticoagulation medication. We now have him taking once a day rivaroxaban.   He is in the slightly reduced dose.  He still has some mild dizziness.    Current Outpatient Prescriptions  Medication Sig Dispense Refill  . aspirin 81 MG tablet Take 81 mg by mouth every other day.       . furosemide (LASIX) 20 MG tablet Take 20 mg by mouth daily.      Marland Kitchen glipiZIDE (GLUCOTROL) 10 MG tablet Take 10 mg by mouth daily.        Marland Kitchen levothyroxine (SYNTHROID, LEVOTHROID) 75 MCG tablet Take 75 mcg by mouth daily.      Marland Kitchen lisinopril (PRINIVIL,ZESTRIL) 10 MG tablet Take 10 mg by mouth daily.      . memantine (NAMENDA) 5 MG tablet Take 5 mg by mouth 2 (two) times daily.      . Multiple Vitamin (MULTIVITAMIN) tablet Take 1 tablet by mouth daily.        . nitroGLYCERIN (NITROSTAT) 0.4 MG SL tablet Place 0.4 mg under the tongue every 5 (five) minutes as needed. For chest pain      . potassium chloride (K-DUR,KLOR-CON) 10 MEQ tablet Take 10 mEq by mouth daily.      . Rivaroxaban (XARELTO) 15 MG TABS tablet Take 1 tablet (15 mg total) by mouth daily.  30 tablet  6  . rosuvastatin (CRESTOR) 10 MG tablet Take 10 mg by mouth daily.      . sertraline (ZOLOFT) 100 MG tablet Take 100 mg by mouth daily.      Marland Kitchen DISCONTD: rivastigmine (EXELON) 4.6 mg/24hr Place 1 patch onto the skin daily.        No Known Allergies  Past Medical History  Diagnosis Date  . CAD (coronary artery disease)     a. 1990 CABGx3: LIMA->LAD, VG->OM, VG->dRCA;  b. 05/2007 VF Arrest/Cath/PCI: 100 VG->OM tx w/ BMS, 95 VG->RCA tx w/ bms;  c. 06/2007 90 Native PDA tx w DES ;  d. 05/2009 Echo EF 60-65%, nl WM, mild MR; e. normal lexiscan myoview 03/30/12  . HTN (hypertension)   . Hypercholesteremia   . Diabetes mellitus   . Atrial fibrillation     a. had been on  tikosyn -> d/c 09/2010;  b. Was on Amio ->d/c 09/2011;  c.  anticoagulated w/ Pradaxa  . Junctional ectopic tachycardia     a. noted 09/2010  . Sinus bradycardia   . CKD (chronic kidney disease), stage III   . Back pain     a. Followed by [facility] - on prednisone  . Prostate cancer   . Unsteady gait     a. in rehab for core strengthening  . Colitis   . Tubular adenoma of colon 02/2007  . Myocardial infarction   . Arthritis   . CHF (congestive heart failure)   . Depression     Past Surgical History  Procedure Date  . Coronary artery bypass graft 1990  . Prostate surgery 01/1992    retropubic prostatectomy  . Cardiac valve surgery   . Coronary angioplasty with stent placement 2008    x 3   . Rt finger trigger a-1 pulley   . Cholecystectomy 05/05/12  . Cholecystectomy 05/05/2012    Procedure: LAPAROSCOPIC CHOLECYSTECTOMY WITH INTRAOPERATIVE CHOLANGIOGRAM;  Surgeon: Mariella Saa,  MD;  Location: [facility] OR;  Service: General;  Laterality: N/A;    Family History  Problem Relation Age of Onset  . Other Father     died of unknown cause @ young age  . Coronary artery disease Mother     died in late 54's w/ "hardening of the arteries"  . Heart disease Mother   . Heart disease Son   . Diabetes Son     History   Social History  . Marital Status: Widowed    Spouse Name: N/A    Number of Children: 4  . Years of Education: N/A   Occupational History  . 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 Smoking status: Former Smoker -- 40 years    Quit date: 09/23/1988  . Smokeless tobacco: Never Used   Comment: quit 20+ years ago  . Alcohol Use: No  . Drug Use: No  . Sexually Active: No   Other Topics Concern  . Not on file   Social History Narrative   The patient resides in [place] alone.  He is widowed.  He has 4 children, 9 grandchildren.  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  He denies any alcohol, drugs, or herbal  medication.  He tries  to maintain a low-fat diet.  He states that he does exercise somewhat with walking, and he uses a stationary bike for a few minutes every other day.       ROS: Please see the HPI.  All other systems reviewed and negative.  PHYSICAL EXAM:  BP 132/72  Pulse 78  Ht 5\' 11"  (1.803 m)  Wt 212 lb 12.8 oz (96.525 kg)  BMI 29.68 kg/m2  General: Well developed, well nourished, in no acute distress. Head:  Normocephalic and atraumatic. Neck: no JVD Lungs: Clear to auscultation and percussion. Heart: Normal S1 and S2.  No murmur, rubs or gallops.  Abdomen:  Normal bowel sounds; soft; non tender; no organomegaly Pulses: Pulses normal in all 4 extremities. Extremities: No clubbing or cyanosis. No edema. Neurologic: Alert and oriented x 3.  EKG:  NSR.  Nonspecific T flattening.  QTc slightly prolonged.   ASSESSMENT AND PLAN:

## 2012-06-14 NOTE — Assessment & Plan Note (Signed)
Bothers him more than anything else.

## 2012-06-14 NOTE — Assessment & Plan Note (Signed)
No current chest pain.  Holding his own.

## 2012-06-14 NOTE — Assessment & Plan Note (Signed)
Currently in NSR and on antithrombin treatment.  Continue medical treatment at present time.

## 2012-06-17 ENCOUNTER — Ambulatory Visit (HOSPITAL_COMMUNITY): Payer: Medicare Other

## 2012-06-29 ENCOUNTER — Encounter (HOSPITAL_COMMUNITY)
Admission: RE | Admit: 2012-06-29 | Discharge: 2012-06-29 | Disposition: A | Discharge status: Home / Self Care | Payer: Medicare Other | Source: Ambulatory Visit | Attending: Internal Medicine | Admitting: Internal Medicine

## 2012-06-29 DIAGNOSIS — C61 Malignant neoplasm of prostate: Secondary | ICD-10-CM

## 2012-06-29 DIAGNOSIS — C3490 Malignant neoplasm of unspecified part of unspecified bronchus or lung: Secondary | ICD-10-CM | POA: Insufficient documentation

## 2012-06-29 DIAGNOSIS — R222 Localized swelling, mass and lump, trunk: Secondary | ICD-10-CM | POA: Insufficient documentation

## 2012-06-29 DIAGNOSIS — R918 Other nonspecific abnormal finding of lung field: Secondary | ICD-10-CM

## 2012-06-29 LAB — GLUCOSE, CAPILLARY: Glucose-Capillary: 89 mg/dL (ref 70–99)

## 2012-06-29 MED ORDER — FLUDEOXYGLUCOSE F - 18 (FDG) INJECTION
17.2000 | Freq: Once | INTRAVENOUS | Status: AC | PRN
  Administered 2012-06-29: 17.2 via INTRAVENOUS

## 2012-07-16 ENCOUNTER — Encounter: Payer: Self-pay | Admitting: *Deleted

## 2012-07-16 ENCOUNTER — Telehealth: Payer: Self-pay | Admitting: Gastroenterology

## 2012-07-16 NOTE — Telephone Encounter (Signed)
Patient and his sons report abdominal pain and nausea.  The patient reports that he has no appetite and when he does eat he just gets nauseated.  The pain he has is worse when he lies down at night.  Patient has recently had a PET scan that shows hypermetabolic lesions in his lung, adrenal gland, and parotid gland.  The family and the patient were questioned about the plans for the PET scan results and he is scheduled for an appt with Dr. Rafael Bihari on Monday.  Dr. Russella Dar has reviewed the PET scan and has requested that I contact Dr. Lanell Matar office and inquire about plans for work up.  The family nor the patient seems to know the results of the scan to the full extent.  I have spoke with Dr. Lanell Matar nurse Graciella Belton. She will have Dr. Jacky Kindle contact the patient and the family again today.  The patient is tentatively scheduled to see .Willette Cluster RNP on Monday at 2:30, Graciella Belton will call me back if the patient will be keeping this appt or cancel.

## 2012-07-17 ENCOUNTER — Emergency Department (HOSPITAL_COMMUNITY)
Admission: EM | Admit: 2012-07-17 | Discharge: 2012-07-17 | Disposition: A | Discharge status: Home / Self Care | Payer: Medicare Other | Attending: Emergency Medicine | Admitting: Emergency Medicine

## 2012-07-17 ENCOUNTER — Emergency Department (HOSPITAL_COMMUNITY): Payer: Medicare Other

## 2012-07-17 DIAGNOSIS — Z8739 Personal history of other diseases of the musculoskeletal system and connective tissue: Secondary | ICD-10-CM | POA: Insufficient documentation

## 2012-07-17 DIAGNOSIS — Z85038 Personal history of other malignant neoplasm of large intestine: Secondary | ICD-10-CM | POA: Insufficient documentation

## 2012-07-17 DIAGNOSIS — E7800 Pure hypercholesterolemia, unspecified: Secondary | ICD-10-CM | POA: Insufficient documentation

## 2012-07-17 DIAGNOSIS — Z7982 Long term (current) use of aspirin: Secondary | ICD-10-CM | POA: Insufficient documentation

## 2012-07-17 DIAGNOSIS — I129 Hypertensive chronic kidney disease with stage 1 through stage 4 chronic kidney disease, or unspecified chronic kidney disease: Secondary | ICD-10-CM | POA: Insufficient documentation

## 2012-07-17 DIAGNOSIS — N183 Chronic kidney disease, stage 3 unspecified: Secondary | ICD-10-CM | POA: Insufficient documentation

## 2012-07-17 DIAGNOSIS — Z79899 Other long term (current) drug therapy: Secondary | ICD-10-CM | POA: Insufficient documentation

## 2012-07-17 DIAGNOSIS — Z87891 Personal history of nicotine dependence: Secondary | ICD-10-CM | POA: Insufficient documentation

## 2012-07-17 DIAGNOSIS — Z8546 Personal history of malignant neoplasm of prostate: Secondary | ICD-10-CM | POA: Insufficient documentation

## 2012-07-17 DIAGNOSIS — Z8659 Personal history of other mental and behavioral disorders: Secondary | ICD-10-CM | POA: Insufficient documentation

## 2012-07-17 DIAGNOSIS — M549 Dorsalgia, unspecified: Secondary | ICD-10-CM | POA: Insufficient documentation

## 2012-07-17 DIAGNOSIS — Z8719 Personal history of other diseases of the digestive system: Secondary | ICD-10-CM | POA: Insufficient documentation

## 2012-07-17 DIAGNOSIS — R109 Unspecified abdominal pain: Secondary | ICD-10-CM

## 2012-07-17 DIAGNOSIS — I252 Old myocardial infarction: Secondary | ICD-10-CM | POA: Insufficient documentation

## 2012-07-17 DIAGNOSIS — I2510 Atherosclerotic heart disease of native coronary artery without angina pectoris: Secondary | ICD-10-CM | POA: Insufficient documentation

## 2012-07-17 DIAGNOSIS — E119 Type 2 diabetes mellitus without complications: Secondary | ICD-10-CM | POA: Insufficient documentation

## 2012-07-17 LAB — CBC WITH DIFFERENTIAL/PLATELET
Basophils Absolute: 0 10*3/uL (ref 0.0–0.1)
Basophils Relative: 0 % (ref 0–1)
Eosinophils Absolute: 0.5 10*3/uL (ref 0.0–0.7)
MCH: 28 pg (ref 26.0–34.0)
MCHC: 33.9 g/dL (ref 30.0–36.0)
Neutro Abs: 5.7 10*3/uL (ref 1.7–7.7)
Neutrophils Relative %: 61 % (ref 43–77)
Platelets: 213 10*3/uL (ref 150–400)

## 2012-07-17 LAB — COMPREHENSIVE METABOLIC PANEL
ALT: 12 U/L (ref 0–53)
AST: 15 U/L (ref 0–37)
CO2: 22 mEq/L (ref 19–32)
Chloride: 103 mEq/L (ref 96–112)
Creatinine, Ser: 1.31 mg/dL (ref 0.50–1.35)
GFR calc Af Amer: 57 mL/min — ABNORMAL LOW (ref >90)
GFR calc non Af Amer: 49 mL/min — ABNORMAL LOW (ref >90)
Glucose, Bld: 97 mg/dL (ref 70–99)
Sodium: 137 mEq/L (ref 135–145)
Total Bilirubin: 0.6 mg/dL (ref 0.3–1.2)

## 2012-07-17 LAB — LIPASE, BLOOD: Lipase: 37 U/L (ref 11–59)

## 2012-07-17 LAB — URINALYSIS, ROUTINE W REFLEX MICROSCOPIC

## 2012-07-17 MED ORDER — IOHEXOL 300 MG/ML  SOLN
20.0000 mL | INTRAMUSCULAR | Status: AC
  Administered 2012-07-17 (×2): 20 mL via ORAL

## 2012-07-17 MED ORDER — PANTOPRAZOLE SODIUM 40 MG IV SOLR: 40.0000 mg | Freq: Once | INTRAVENOUS | Status: DC

## 2012-07-17 MED ORDER — OMEPRAZOLE 20 MG PO CPDR: 20.0000 mg | DELAYED_RELEASE_CAPSULE | Freq: Every day | ORAL | Status: DC

## 2012-07-17 MED ORDER — METOCLOPRAMIDE HCL 5 MG/ML IJ SOLN
10.0000 mg | Freq: Once | INTRAMUSCULAR | Status: AC
  Administered 2012-07-17: 10 mg via INTRAVENOUS
  Filled 2012-07-17: qty 2

## 2012-07-17 MED ORDER — MORPHINE SULFATE 4 MG/ML IJ SOLN
4.0000 mg | Freq: Once | INTRAMUSCULAR | Status: AC
  Administered 2012-07-17: 4 mg via INTRAVENOUS
  Filled 2012-07-17: qty 1

## 2012-07-17 MED ORDER — ONDANSETRON HCL 4 MG/2ML IJ SOLN
4.0000 mg | Freq: Once | INTRAMUSCULAR | Status: AC
  Administered 2012-07-17: 4 mg via INTRAVENOUS
  Filled 2012-07-17: qty 2

## 2012-07-17 MED ORDER — ALUM & MAG HYDROXIDE-SIMETH 200-200-20 MG/5ML PO SUSP: 30.0000 mL | Freq: Four times a day (QID) | ORAL | Status: DC | PRN

## 2012-07-17 MED ORDER — IOHEXOL 300 MG/ML  SOLN
100.0000 mL | Freq: Once | INTRAMUSCULAR | Status: AC | PRN
  Administered 2012-07-17: 100 mL via INTRAVENOUS

## 2012-07-17 MED ORDER — PANTOPRAZOLE SODIUM 40 MG IV SOLR
40.0000 mg | Freq: Once | INTRAVENOUS | Status: AC
  Administered 2012-07-17: 40 mg via INTRAVENOUS
  Filled 2012-07-17: qty 40

## 2012-07-17 MED ORDER — SODIUM CHLORIDE 0.9 % IV SOLN
Freq: Once | INTRAVENOUS | Status: AC
  Administered 2012-07-17: 11:00:00 via INTRAVENOUS

## 2012-07-17 MED ORDER — ALUM & MAG HYDROXIDE-SIMETH 200-200-20 MG/5ML PO SUSP
30.0000 mL | Freq: Once | ORAL | Status: AC
  Administered 2012-07-17: 30 mL via ORAL
  Filled 2012-07-17: qty 30

## 2012-07-17 NOTE — ED Notes (Signed)
Pt finished contrast. CT notified 

## 2012-07-17 NOTE — ED Notes (Signed)
Dr Shela Commons in to see patient

## 2012-07-17 NOTE — ED Notes (Signed)
Pt had his Gall bladder removed in Aug has had problems with ongoing abd pain. Pt has appt with GI on Monday

## 2012-07-17 NOTE — ED Provider Notes (Signed)
Medical screening examination/treatment/procedure(s) were conducted as a shared visit with non-physician practitioner(s) and myself.  I personally evaluated the patient during the encounter  [name], MD 07/17/12 2043 

## 2012-07-17 NOTE — ED Notes (Signed)
Family at bedside. 

## 2012-07-17 NOTE — ED Provider Notes (Signed)
11:45:  Patient moved to CDU to await CT abdomen for evaluation of increasing abdominal pain for 3 months. He has had a cholecystectomy and some degree of pain since. No fever. If CT scan negative, patient will possibly discharged home and has appointment with GI Russella Dar next week.  1:30:  CT scan is negative for acute findings. Patient's epigastric pain continues causing nausea and vomiting, difficult to control. Protonix, Mylanta and Reglan ordered. Will observe.  3:10:  Feeling much better after medication given. Ready to be discharged home.   Rodena Medin, PA-C 07/17/12 1511

## 2012-07-17 NOTE — ED Provider Notes (Signed)
History     CSN: [id number]  Arrival date & time 07/17/12  1191   First MD Initiated Contact with Patient 07/17/12 1034      Chief Complaint  Patient presents with  . Abdominal Pain  . Back Pain    (Consider location/radiation/quality/duration/timing/severity/associated sxs/prior treatment) HPI Complains of diffuse abdominal pain and right flank pain onset 3 months ago since having had his gallbladder out. Pain is worse with movement improved with remaining still admission diminished appetite for several months. 78-year-old chicken and a fried apple last period . No associated fever other associated symptoms include extensive gas parietum and the rock patient and. His he has been treated with antacids without relief. Patient has a with Dr. Russella Dar scheduled for 07/21/2012 for this same complaint however he had a bad night last night. Last bowel movement yesterday. Past Medical History  Diagnosis Date  . CAD (coronary artery disease)     a. 1990 CABGx3: LIMA->LAD, VG->OM, VG->dRCA;  b. 05/2007 VF Arrest/Cath/PCI: 100 VG->OM tx w/ BMS, 95 VG->RCA tx w/ bms;  c. 06/2007 90 Native PDA tx w DES ;  d. 05/2009 Echo EF 60-65%, nl WM, mild MR; e. normal lexiscan myoview 03/30/12  . HTN (hypertension)   . Hypercholesteremia   . Diabetes mellitus   . Atrial fibrillation     a. had been on tikosyn -> d/c 09/2010;  b. Was on Amio ->d/c 09/2011;  c.  anticoagulated w/ Pradaxa  . Junctional ectopic tachycardia     a. noted 09/2010  . Sinus bradycardia   . CKD (chronic kidney disease), stage III   . Back pain     a. Followed by [facility] - on prednisone  . Prostate cancer   . Unsteady gait     a. in rehab for core strengthening  . Colitis   . Tubular adenoma of colon 02/2007  . Myocardial infarction   . Arthritis   . CHF (congestive heart failure)   . Depression   . Internal hemorrhoids without mention of complication   . Diverticulosis of colon (without mention of hemorrhage)     Past Surgical History   Procedure Date  . Coronary artery bypass graft 1990  . Prostate surgery 01/1992    retropubic prostatectomy  . Cardiac valve surgery   . Coronary angioplasty with stent placement 2008    x 3   . Rt finger trigger a-1 pulley   . Cholecystectomy 05/05/12  . Cholecystectomy 05/05/2012    Procedure: LAPAROSCOPIC CHOLECYSTECTOMY WITH INTRAOPERATIVE CHOLANGIOGRAM;  Surgeon: Mariella Saa, MD;  Location: [facility] OR;  Service: General;  Laterality: N/A;    Family History  Problem Relation Age of Onset  . Other Father     died of unknown cause @ young age  . Coronary artery disease Mother     died in late 29's w/ "hardening of the arteries"  . Heart disease Mother   . Heart disease Son   . Diabetes Son     History  Substance Use Topics  . Smoking status: Former Smoker -- 40 years    Quit date: 09/23/1988  . Smokeless tobacco: Never Used   Comment: quit 20+ years ago  . Alcohol Use: No      Review of Systems  Constitutional: Positive for appetite change.  Gastrointestinal: Positive for nausea and abdominal pain.  Genitourinary: Positive for flank pain.  All other systems reviewed and are negative.    Allergies  Review of patient's allergies indicates no known allergies.  Home Medications   Current Outpatient Rx  Name Route Sig Dispense Refill  . ASPIRIN 81 MG PO TABS Oral Take 81 mg by mouth every other day.     . FUROSEMIDE 20 MG PO TABS Oral Take 20 mg by mouth daily.    Marland Kitchen GLIPIZIDE 10 MG PO TABS Oral Take 10 mg by mouth daily.      Marland Kitchen PREVACID PO Oral Take 1 tablet by mouth daily.    Marland Kitchen LEVOTHYROXINE SODIUM 75 MCG PO TABS Oral Take 75 mcg by mouth daily.    Marland Kitchen LISINOPRIL 10 MG PO TABS Oral Take 10 mg by mouth daily.    Marland Kitchen ONE-DAILY MULTI VITAMINS PO TABS Oral Take 1 tablet by mouth daily.      Marland Kitchen POTASSIUM CHLORIDE CRYS ER 10 MEQ PO TBCR Oral Take 10 mEq by mouth daily.    Marland Kitchen RIVAROXABAN 15 MG PO TABS Oral Take 15 mg by mouth daily.    Marland Kitchen ROSUVASTATIN CALCIUM 10 MG PO  TABS Oral Take 10 mg by mouth daily.    Marland Kitchen NITROGLYCERIN 0.4 MG SL SUBL Sublingual Place 0.4 mg under the tongue every 5 (five) minutes as needed. For chest pain      BP 158/72  Pulse 68  Temp 98 F (36.7 C) (Oral)  Resp 16  SpO2 97%  Physical Exam  Nursing note and vitals reviewed. Constitutional: He appears well-developed and well-nourished.  HENT:  Head: Normocephalic and atraumatic.  Eyes: Conjunctivae normal are normal. Pupils are equal, round, and reactive to light.  Neck: Neck supple. No tracheal deviation present. No thyromegaly present.  Cardiovascular: Normal rate and regular rhythm.   No murmur heard. Pulmonary/Chest: Effort normal and breath sounds normal.  Abdominal: Soft. Bowel sounds are normal. He exhibits no distension. There is tenderness.       Minimal tenderness diffusely  Musculoskeletal: Normal range of motion. He exhibits no edema and no tenderness.       Tenderness left paralumbar area pain is exacerbated a left paralumbar area and patient sits up from a supine position  Neurological: He is alert. Coordination normal.  Skin: Skin is warm and dry. No rash noted.  Psychiatric: He has a normal mood and affect.    ED Course  Procedures (including critical care time)  Labs Reviewed - No data to display No results found.   No diagnosis found.    MDM  Patient with recent PET scan. Has not had CTabd/pelvis scan since July 2013. Will repeat CT scan abdomen/pelvis. Symptomatic control and follow up with Dr. Russella Dar as outpatient his ED workup nonacute        Doug Sou, MD 07/17/12 1119

## 2012-07-17 NOTE — ED Notes (Signed)
Patient is alert and orientedx4.  Patient was explained discharge instructions and had no questions.  Patient is being transported home by his son, Alfonse Garringer.

## 2012-07-17 NOTE — ED Notes (Signed)
Urinal given to pt 

## 2012-07-17 NOTE — ED Notes (Signed)
Has a spot on his lung and has appt with pulmonary on Monday for follow up

## 2012-07-20 ENCOUNTER — Ambulatory Visit (INDEPENDENT_AMBULATORY_CARE_PROVIDER_SITE_OTHER): Payer: Medicare Other | Admitting: Nurse Practitioner

## 2012-07-20 ENCOUNTER — Encounter: Payer: Self-pay | Admitting: Internal Medicine

## 2012-07-20 ENCOUNTER — Encounter: Payer: Self-pay | Admitting: Nurse Practitioner

## 2012-07-20 ENCOUNTER — Ambulatory Visit (INDEPENDENT_AMBULATORY_CARE_PROVIDER_SITE_OTHER): Payer: Medicare Other | Admitting: Internal Medicine

## 2012-07-20 VITALS — BP 150/80 | HR 60 | Ht 71.0 in | Wt 212.8 lb

## 2012-07-20 VITALS — BP 160/82 | HR 61 | Temp 97.8°F | Ht 71.0 in | Wt 213.0 lb

## 2012-07-20 DIAGNOSIS — R05 Cough: Secondary | ICD-10-CM

## 2012-07-20 DIAGNOSIS — R1013 Epigastric pain: Secondary | ICD-10-CM

## 2012-07-20 DIAGNOSIS — R220 Localized swelling, mass and lump, head: Secondary | ICD-10-CM

## 2012-07-20 DIAGNOSIS — R222 Localized swelling, mass and lump, trunk: Secondary | ICD-10-CM

## 2012-07-20 DIAGNOSIS — K118 Other diseases of salivary glands: Secondary | ICD-10-CM

## 2012-07-20 DIAGNOSIS — R918 Other nonspecific abnormal finding of lung field: Secondary | ICD-10-CM

## 2012-07-20 NOTE — Progress Notes (Signed)
07/20/2012 Shawn [name] [id number] 02-12-1928   History of Present Illness:  Patient is an 78 year old male with multiple medical problems, on multiple medications including Xarelto. Patient known to Dr. Russella Dar for history of adenomatous polyps, intestinal AVMs, diverticulosis and hemorrhoids. He was seen by Dr. Russella Dar in late July of this year for evaluation of anorexia, epigastric pain and nausea.  Subsequent to that  the patient underwent upper endoscopy with findings of only a small hiatal hernia. He subsequently had a HIDA scan which showed a low gallbladder ejection fraction of 9.6%. Patient was evaluated by surgery and underwent a cholecystectomy with IOC (normal) in mid August.  Gallbladder was chronically thickened.   Patient is back with recurrent upper abdominal pain radiating through to the back, gas, and nausea. Describes pain as gas-like. He wakes up at night with the pain. This is same pain as what patient had prior to cholecystectomy. Pain got worse Friday, he went to ED where he got Protonix and Reglan. CBC, LFTs and lipase were normal. CT scan negative for acute findings. He has been taking bid Maalox since ED visit and feels better. He slept through the night. He has been taking Prevacid at night for several months.  No drastic weight loss  Current Medications, Allergies, Past Medical History, Past Surgical History, Family History and Social History were reviewed in [employer] record.   Physical Exam: General: Well developed , white male in no acute distress Head: Normocephalic and atraumatic Eyes:  sclerae anicteric, conjunctiva pink  Ears: Normal auditory acuity Lungs: Clear throughout to auscultation Heart: Regular rate and rhythm Abdomen: Soft, non tender and non distended. No masses, no hepatomegaly. Normal bowel sounds Musculoskeletal: Symmetrical with no gross deformities  Extremities: No edema  Neurological: Alert oriented x 4, grossly  nonfocal Psychological:  Alert and cooperative. Normal mood and affect  Assessment and Recommendations:  1. Recurrent epigastric pain and nausea despite cholecystectomy in mid August. Negative EGD prior to cholecystectomy. Labs and CTscan non-diagnostic. Cause of ongoing ("gas like" ) pain not clear but it responding to Maalox. Patient believes Reglan given to him in ED Friday was very helpful. We discussed Reglan / indications / side effects. For now patient will continue PPI and Maalox. Should pain / nausea recur then we can try short course of Reglan. Patient or his sons will let me know how he does over next few days.    2. Constipation, he takes Linzess as needed and it works well.     3.  Hilar mass, patient is for pulmonary evaluation today.   4. 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

## 2012-07-20 NOTE — Patient Instructions (Addendum)
#  parotid gland uptake  On PET scan left side  - this likely needs separate biopsy, I will discuss this in tumor conference  #Left lung, adrenal and left chest gland uptake on PET scan  - you need endobronchial ultrasound bronchoscopy biopsy  -Risks of procedure include pneumothorax, hemothorax, sedation/anesthesia complications such as cardiac or respiratory arrest or hypotension, stroke and bleeding and non-diagnosis. Marland Kitchen Benefit is to make a diagnosis   - I will again discuss this at tumor board - I will get Dr Riley Kill to given opinion on suitability for the procedure - I will try to get a suitable date for biopsy asap  #Followup  - please wait to hear from [place] next 48-72h  - If you do not hear from [place] 11/09/24/11 please call us

## 2012-07-20 NOTE — Progress Notes (Signed)
Subjective:    Patient ID: Shawn [name], male    DOB: 06/28/1928, 78 y.o.   MRN: [id number]  HPI PCP is ARONSON,RICHARD A, MD   Body mass index is 29.71 kg/(m^2).  reports that he quit smoking about 23 years ago. He has never used smokeless tobacco.    IOV 07/20/12 78 year old pleasant male. Been having gastric symptioms on and offf since summer 2013. CT abdomen lung cut in July 2013 showed RLLL subpleural 1.5cm nodule. Had CT guided TTNA arrnaged but due to gastric issues this was placed on hold. This was follwed up with PET scan 06/29/12 that showed  1)  hypermetabolic nodule within the left parotid gland which  measures 8 mm (image 31) with SUV max = 5.7.   2) Chest: There is a left suprahilar mass surrounding the left upper  lobe bronchus measuring approximately 22 x 16 mm with SUV max = the  19.3. Adjacent hypermetabolic AP window node is difficult to see  on the CT portion and measures approximately 30 mm with SUV max =  16.2.    3) There is a focus of peripheral ground-glass opacity and pleural thickening in the superior left upper lobe mearuring12 mm x 12 mm  (image 74) with S UV max 5.4.   4) No PET lightting of the RLL nodule seen in July 2013 and still seen on PET CT 06/29/12  5) No additional hypermetabolic mediastinal lymph nodes.   6) There is hypermetabolic activity associated with  the left adrenal gland with SUV max = 4.5. Adrenal gland on the CT  portion is mildly full but not abnormally enlarged.    Currently main respiratory symptom is chronic cough for several months with some sputum. Denies acid reflux of sinus drainge. Noted he is on ACE inhibitors.     Past Medical History  Diagnosis Date  . CAD (coronary artery disease)     a. 1990 CABGx3: LIMA->LAD, VG->OM, VG->dRCA;  b. 05/2007 VF Arrest/Cath/PCI: 100 VG->OM tx w/ BMS, 95 VG->RCA tx w/ bms;  c. 06/2007 90 Native PDA tx w DES ;  d. 05/2009 Echo EF 60-65%, nl WM, mild MR; e. normal lexiscan myoview  03/30/12  . HTN (hypertension)   . Hypercholesteremia   . Diabetes mellitus   . Atrial fibrillation     a. had been on tikosyn -> d/c 09/2010;  b. Was on Amio ->d/c 09/2011;  c.  anticoagulated w/ Pradaxa  . Junctional ectopic tachycardia     a. noted 09/2010  . Sinus bradycardia   . CKD (chronic kidney disease), stage III   . Back pain     a. Followed by [facility] - on prednisone  . Prostate cancer   . Unsteady gait     a. in rehab for core strengthening  . Colitis   . Tubular adenoma of colon 02/2007  . Myocardial infarction   . Arthritis   . CHF (congestive heart failure)   . Depression   . Internal hemorrhoids without mention of complication   . Diverticulosis of colon (without mention of hemorrhage)      Family History  Problem Relation Age of Onset  . Other Father     died of unknown cause @ young age  . Coronary artery disease Mother     died in late 60's w/ "hardening of the arteries"  . Heart disease Mother   . Heart disease Son   . Diabetes Son   . Colon cancer Neg Hx        History   Social History  . Marital Status: Widowed    Spouse Name: N/A    Number of Children: 4  . Years of Education: N/A   Occupational History  . 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 Smoking status: Former Smoker -- 40 years    Quit date: 09/23/1988  . Smokeless tobacco: Never Used   Comment: quit 20+ years ago  . Alcohol Use: No  . Drug Use: No  . Sexually Active: No   Other Topics Concern  . Not on file   Social History Narrative   The patient resides in [place] alone.  He is widowed.  He has 4 children, 9 grandchildren.  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  He denies any alcohol, drugs, or herbal medication.  He tries  to maintain a low-fat diet.  He states that he does exercise somewhat with walking, and he uses a stationary bike for a few minutes every other day.        No Known Allergies   Outpatient  Prescriptions Prior to Visit  Medication Sig Dispense Refill  . alum & mag hydroxide-simeth (MAALOX/MYLANTA) 200-200-20 MG/5ML suspension Take 30 mLs by mouth every 6 (six) hours as needed for indigestion.  355 mL  0  . aspirin 81 MG tablet Take 81 mg by mouth every other day.       . furosemide (LASIX) 20 MG tablet Take 20 mg by mouth daily.      . glipiZIDE (GLUCOTROL) 10 MG tablet Take 10 mg by mouth daily.        . Lansoprazole (PREVACID PO) Take 1 tablet by mouth daily.      . levothyroxine (SYNTHROID, LEVOTHROID) 75 MCG tablet Take 75 mcg by mouth daily.      . lisinopril (PRINIVIL,ZESTRIL) 10 MG tablet Take 10 mg by mouth daily.      . Multiple Vitamin (MULTIVITAMIN) tablet Take 1 tablet by mouth daily.        . nitroGLYCERIN (NITROSTAT) 0.4 MG SL tablet Place 0.4 mg under the tongue every 5 (five) minutes as needed. For chest pain      . potassium chloride (K-DUR,KLOR-CON) 10 MEQ tablet Take 10 mEq by mouth daily.      . Rivaroxaban (XARELTO) 15 MG TABS tablet Take 15 mg by mouth daily.      . rosuvastatin (CRESTOR) 10 MG tablet Take 10 mg by mouth daily.           Review of Systems  Constitutional: Negative for fever and unexpected weight change.  HENT: Positive for congestion. Negative for ear pain, nosebleeds, sore throat, rhinorrhea, sneezing, trouble swallowing, dental problem, postnasal drip and sinus pressure.   Eyes: Negative for redness and itching.  Respiratory: Positive for cough and shortness of breath. Negative for chest tightness and wheezing.   Cardiovascular: Negative for palpitations and leg swelling.  Gastrointestinal: Positive for abdominal pain. Negative for nausea and vomiting.  Genitourinary: Negative for dysuria.  Musculoskeletal: Negative for joint swelling.  Skin: Negative for rash.  Neurological: Negative for headaches.  Hematological: Does not bruise/bleed easily.  Psychiatric/Behavioral: Positive for dysphoric mood. The patient is not  nervous/anxious.        Objective:   Physical Exam  Nursing note and vitals reviewed. Constitutional: He is oriented to person, place, and time. He appears well-developed and well-nourished. No distress.  HENT:  Head: Normocephalic and atraumatic.    Right Ear: External ear normal.  Left Ear: External ear normal.  Mouth/Throat: Oropharynx is clear and moist. No oropharyngeal exudate.  Eyes: Conjunctivae normal and EOM are normal. Pupils are equal, round, and reactive to light. Right eye exhibits no discharge. Left eye exhibits no discharge. No scleral icterus.  Neck: Normal range of motion. Neck supple. No JVD present. No tracheal deviation present. No thyromegaly present.  Cardiovascular: Normal rate, regular rhythm and intact distal pulses.  Exam reveals no gallop and no friction rub.   No murmur heard. Pulmonary/Chest: Effort normal and breath sounds normal. No respiratory distress. He has no wheezes. He has no rales. He exhibits no tenderness.  Abdominal: Soft. Bowel sounds are normal. He exhibits no distension and no mass. There is no tenderness. There is no rebound and no guarding.  Musculoskeletal: Normal range of motion. He exhibits no edema and no tenderness.  Lymphadenopathy:    He has no cervical adenopathy.  Neurological: He is alert and oriented to person, place, and time. He has normal reflexes. No cranial nerve deficit. Coordination normal.  Skin: Skin is warm and dry. No rash noted. He is not diaphoretic. No erythema. No pallor.  Psychiatric: He has a normal mood and affect. His behavior is normal. Judgment and thought content normal.          Assessment & Plan:   

## 2012-07-21 ENCOUNTER — Telehealth: Payer: Self-pay | Admitting: Internal Medicine

## 2012-07-21 ENCOUNTER — Encounter: Payer: Self-pay | Admitting: Nurse Practitioner

## 2012-07-21 DIAGNOSIS — R918 Other nonspecific abnormal finding of lung field: Secondary | ICD-10-CM | POA: Insufficient documentation

## 2012-07-21 DIAGNOSIS — R1013 Epigastric pain: Secondary | ICD-10-CM | POA: Insufficient documentation

## 2012-07-21 DIAGNOSIS — I4891 Unspecified atrial fibrillation: Secondary | ICD-10-CM

## 2012-07-21 DIAGNOSIS — K118 Other diseases of salivary glands: Secondary | ICD-10-CM | POA: Insufficient documentation

## 2012-07-21 NOTE — Telephone Encounter (Signed)
ATC family to make them aware. NA, no voicemail. Carron Curie, CMA

## 2012-07-21 NOTE — Assessment & Plan Note (Signed)
New Left Incidental Partoid Mass 8mm on PET scan that is PET HOT - 06/29/12. Suspect this is incidental tumor and separate from other lesions  Plan Likely needs needle bxl will discuss at tumor board

## 2012-07-21 NOTE — Progress Notes (Signed)
Reviewed and agree with management plans.  Dejanique Ruehl T. Rodel Glaspy MD FACG  

## 2012-07-21 NOTE — Assessment & Plan Note (Signed)
Has Left Hilar Mass - PET  HOT Has Left supleural GGO like nodule - PETHOT Has small left adrenal gland pet hot lesion  All 3 can be explained on basis of stage 4nsclc or extensive stage small cell  Plan  - probably best site is EBUS guided bronch bx of node / left hilum which will give higher stage compared to lung lesion (adrenal bx probabbly not possible because is small)  - risks benefts d/w him and his sons; they agree to proceed but want to get clearance from Dr Riley Kill  - will need to come off xarelto for procedure  - will discuss at tumor board

## 2012-07-21 NOTE — Assessment & Plan Note (Addendum)
Priobabl ace inhibitor mediated. Will address it after lung mass issues are sorted out.

## 2012-07-21 NOTE — Telephone Encounter (Signed)
I spoke to Dr Riley Kill . He is away for  A meeting. He said to coordinate appt with one of his PAs or ASAP to get cardiac clearance for EBUS. He knows patient and family well   Please inform family and help get appt

## 2012-07-21 NOTE — Telephone Encounter (Signed)
Hi Shawn [name]  I plan to do EBUS/Bronmch  Biopsy of a new Hilar Mass on left side that is very suspicious for lung cancer. His sons want me to check with you if ok for short GA. Of course, he will need to come off xaretlo and anticoagulants for procedure.  Also, he has cough and is on ace inhibitors  If you feel like you need to see him or have the PA see him, please let me know and I can facilitate that  Thanks  [facility]

## 2012-07-21 NOTE — Telephone Encounter (Signed)
Pt is aware. Jennifer Castillo, CMA  

## 2012-07-23 ENCOUNTER — Telehealth: Payer: Self-pay | Admitting: Internal Medicine

## 2012-07-23 ENCOUNTER — Telehealth: Payer: Self-pay | Admitting: Nurse Practitioner

## 2012-07-23 DIAGNOSIS — D490 Neoplasm of unspecified behavior of digestive system: Secondary | ICD-10-CM

## 2012-07-23 MED ORDER — METOCLOPRAMIDE HCL 5 MG/5ML PO SOLN: 5.0000 mg | Freq: Three times a day (TID) | ORAL | Status: DC

## 2012-07-23 NOTE — Telephone Encounter (Signed)
Sent prescription to [facility] per Willette Cluster ACNP for Reglan 5 mg. He is to take 3 times daily before meals as needed.

## 2012-07-23 NOTE — Telephone Encounter (Signed)
[id number].Jennifer Castillo, CMA  

## 2012-07-23 NOTE — Telephone Encounter (Signed)
Shawn [name]  I discussed at [facility] today   - for parotid pet hot lesion:best he sees ENT. I spoke to Dr Lazarus Salines of ENT and he has agreed to see him for evaluation. Please faciliate this. Order placed (for my use: [facility] -- Dr Fredia Sorrow thinks too small for IR to bx. Others think this might be a false positive benign lesion)  - for lung mass: everyone agrees on EBUS  Thanks  MR

## 2012-07-28 ENCOUNTER — Encounter (HOSPITAL_COMMUNITY): Payer: Self-pay | Admitting: Pharmacy Technician

## 2012-07-28 ENCOUNTER — Encounter: Payer: Self-pay | Admitting: Cardiology

## 2012-07-28 ENCOUNTER — Ambulatory Visit (INDEPENDENT_AMBULATORY_CARE_PROVIDER_SITE_OTHER): Payer: Medicare Other | Admitting: Cardiology

## 2012-07-28 VITALS — BP 122/68 | HR 63 | Ht 71.0 in | Wt 214.0 lb

## 2012-07-28 DIAGNOSIS — I2510 Atherosclerotic heart disease of native coronary artery without angina pectoris: Secondary | ICD-10-CM

## 2012-07-28 DIAGNOSIS — R1013 Epigastric pain: Secondary | ICD-10-CM

## 2012-07-28 DIAGNOSIS — I4891 Unspecified atrial fibrillation: Secondary | ICD-10-CM

## 2012-07-28 NOTE — Progress Notes (Signed)
HPI:  The patient presents today for pre-bronch evaluation.  He has known CAD, atrial fib on Xarelto, and prior CABG.  He has had prior stenting.  He now has pos CT Pet suggesting a left lung tumor, and biopsy is scheduled.  He has not had increasing pain of unstable angina, and has not had other cardiac symptoms.  His nausea has improved ever since he was placed on Reglan.  They are now prescribing this.  His back is still bothering him.    Current Outpatient Prescriptions  Medication Sig Dispense Refill  . alum & mag hydroxide-simeth (MAALOX/MYLANTA) 200-200-20 MG/5ML suspension Take 30 mLs by mouth every 6 (six) hours as needed. For indigestion      . aspirin 81 MG tablet Take 81 mg by mouth every other day.       . furosemide (LASIX) 20 MG tablet Take 20 mg by mouth daily.      Marland Kitchen glipiZIDE (GLUCOTROL) 10 MG tablet Take 10 mg by mouth daily.        Marland Kitchen levothyroxine (SYNTHROID, LEVOTHROID) 75 MCG tablet Take 75 mcg by mouth daily.      . Linaclotide (LINZESS) 145 MCG CAPS Take 145 mcg by mouth every other day.      . lisinopril (PRINIVIL,ZESTRIL) 10 MG tablet Take 10 mg by mouth daily.      . metoCLOPramide (REGLAN) 5 MG/5ML solution Take 5 mLs (5 mg total) by mouth 4 (four) times daily -  before meals and at bedtime.  90 mL  0  . Multiple Vitamin (MULTIVITAMIN) tablet Take 1 tablet by mouth daily.        . nitroGLYCERIN (NITROSTAT) 0.4 MG SL tablet Place 0.4 mg under the tongue every 5 (five) minutes as needed. For chest pain      . potassium chloride (K-DUR,KLOR-CON) 10 MEQ tablet Take 10 mEq by mouth daily.      . Rivaroxaban (XARELTO) 15 MG TABS tablet Take 15 mg by mouth daily.      . rosuvastatin (CRESTOR) 10 MG tablet Take 10 mg by mouth daily.      . [DISCONTINUED] rivastigmine (EXELON) 4.6 mg/24hr Place 1 patch onto the skin daily.        No Known Allergies  Past Medical History  Diagnosis Date  . CAD (coronary artery disease)     a. 1990 CABGx3: LIMA->LAD, VG->OM, VG->dRCA;   b. 05/2007 VF Arrest/Cath/PCI: 100 VG->OM tx w/ BMS, 95 VG->RCA tx w/ bms;  c. 06/2007 90 Native PDA tx w DES ;  d. 05/2009 Echo EF 60-65%, nl WM, mild MR; e. normal lexiscan myoview 03/30/12  . HTN (hypertension)   . Hypercholesteremia   . Diabetes mellitus   . Atrial fibrillation     a. had been on tikosyn -> d/c 09/2010;  b. Was on Amio ->d/c 09/2011;  c.  anticoagulated w/ Pradaxa  . Junctional ectopic tachycardia     a. noted 09/2010  . Sinus bradycardia   . CKD (chronic kidney disease), stage III   . Back pain     a. Followed by [facility] - on prednisone  . Prostate cancer   . Unsteady gait     a. in rehab for core strengthening  . Colitis   . Tubular adenoma of colon 02/2007  . Myocardial infarction   . Arthritis   . CHF (congestive heart failure)   . Depression   . Internal hemorrhoids without mention of complication   . Diverticulosis of colon (without mention  of hemorrhage)     Past Surgical History  Procedure Date  . Coronary artery bypass graft 1990  . Prostate surgery 01/1992    retropubic prostatectomy  . Cardiac valve surgery   . Coronary angioplasty with stent placement 2008    x 3   . Rt finger trigger a-1 pulley   . Cholecystectomy 05/05/12  . Cholecystectomy 05/05/2012    Procedure: LAPAROSCOPIC CHOLECYSTECTOMY WITH INTRAOPERATIVE CHOLANGIOGRAM;  Surgeon: Mariella Saa, MD;  Location: [facility] OR;  Service: General;  Laterality: N/A;    Family History  Problem Relation Age of Onset  . Other Father     died of unknown cause @ young age  . Coronary artery disease Mother     died in late 76's w/ "hardening of the arteries"  . Heart disease Mother   . Heart disease Son   . Diabetes Son   . Colon cancer Neg Hx     History   Social History  . Marital Status: Widowed    Spouse Name: N/A    Number of Children: 4  . Years of Education: N/A   Occupational History  . 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 Smoking status: Former Smoker -- 40 years     Quit date: 09/23/1988  . Smokeless tobacco: Never Used     Comment: quit 20+ years ago  . Alcohol Use: No  . Drug Use: No  . Sexually Active: No   Other Topics Concern  . Not on file   Social History Narrative   The patient resides in [place] alone.  He is widowed.  He has 4 children, 9 grandchildren.  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  He denies any alcohol, drugs, or herbal medication.  He tries  to maintain a low-fat diet.  He states that he does exercise somewhat with walking, and he uses a stationary bike for a few minutes every other day.       ROS: Please see the HPI.  All other systems reviewed and negative.  PHYSICAL EXAM:  BP 122/68  Pulse 63  Ht 5\' 11"  (1.803 m)  Wt 214 lb (97.07 kg)  BMI 29.85 kg/m2  SpO2 94%  General: Well developed, well nourished, in no acute distress. Head:  Normocephalic and atraumatic. Neck: no JVD Lungs: Clear to auscultation and percussion. Heart: Normal S1 and S2.  Positive S4.  Minimal apical murmur.    Pulses: Pulses normal in all 4 extremities. Extremities: No clubbing or cyanosis. No edema. Neurologic: Alert and oriented x 3.  EKG:  NSR.  Nonspecific T abnormality.   ASSESSMENT AND PLAN:

## 2012-07-28 NOTE — Assessment & Plan Note (Signed)
Seems to be improved 

## 2012-07-28 NOTE — Assessment & Plan Note (Signed)
No recurrent angina.  He is stable from this standpoint.

## 2012-07-28 NOTE — Patient Instructions (Signed)
Your physician recommends that you schedule a follow-up appointment in: 4-6 WEEKS  Your physician recommends that you continue on your current medications as directed. Please refer to the Current Medication list given to you today.   

## 2012-07-28 NOTE — Assessment & Plan Note (Signed)
Current stable rhythm.  He should come off Xarelto 48 hours in advance.  Last dose will be Saturday pm for Tuesday event.  Afterward, restart should be when he is stable to have full anticoagulation as this will occur quickly.

## 2012-07-29 ENCOUNTER — Encounter (HOSPITAL_COMMUNITY)
Admission: RE | Admit: 2012-07-29 | Discharge: 2012-07-29 | Disposition: A | Discharge status: Home / Self Care | Payer: Medicare Other | Source: Ambulatory Visit | Attending: Internal Medicine | Admitting: Internal Medicine

## 2012-07-29 ENCOUNTER — Encounter (HOSPITAL_COMMUNITY): Payer: Self-pay

## 2012-07-29 HISTORY — DX: Gastro-esophageal reflux disease without esophagitis: K21.9

## 2012-07-29 LAB — COMPREHENSIVE METABOLIC PANEL
ALT: 29 U/L (ref 0–53)
Alkaline Phosphatase: 117 U/L (ref 39–117)
CO2: 23 mEq/L (ref 19–32)
GFR calc Af Amer: 61 mL/min — ABNORMAL LOW (ref >90)
Glucose, Bld: 146 mg/dL — ABNORMAL HIGH (ref 70–99)
Potassium: 4.2 mEq/L (ref 3.5–5.1)
Sodium: 137 mEq/L (ref 135–145)
Total Protein: 6.9 g/dL (ref 6.0–8.3)

## 2012-07-29 LAB — CBC
Hemoglobin: 12.8 g/dL — ABNORMAL LOW (ref 13.0–17.0)
MCHC: 33.2 g/dL (ref 30.0–36.0)
RBC: 4.58 MIL/uL (ref 4.22–5.81)
WBC: 10.8 10*3/uL — ABNORMAL HIGH (ref 4.0–10.5)

## 2012-07-29 LAB — APTT: aPTT: 38 seconds — ABNORMAL HIGH (ref 24–37)

## 2012-07-29 LAB — TROPONIN I: Troponin I: <0.3 ng/mL (ref <0.30)

## 2012-07-29 NOTE — Pre-Procedure Instructions (Signed)
22 Shawn [name]  07/29/2012   Your procedure is scheduled on:  Tuesday, November 12  Report to [facility] at 0715 AM.  Call this number if you have problems the morning of surgery: [phone]   Remember:Stop Lesia Hausen after Saturday.   Do not eat food or drink liquids After Midnight.     Take these medicines the morning of surgery with A SIP OF WATER: Synthroid,   Do not wear jewelry, make-up or nail polish.  Do not wear lotions, powders, or perfumes. You may wear deodorant.  Do not shave 48 hours prior to surgery. Men may shave face and neck.  Do not bring valuables to the hospital.  Contacts, dentures or bridgework may not be worn into surgery.  Leave suitcase in the car. After surgery it may be brought to your room.  For patients admitted to the hospital, checkout time is 11:00 AM the day of discharge.   Patients discharged the day of surgery will not be allowed to drive home.  Name and phone number of your driver: _____________  Special Instructions: Shower using CHG 2 nights before surgery and the night before surgery.  If you shower the day of surgery use CHG.  Use special wash - you have one bottle of CHG for all showers.  You should use approximately 1/3 of the bottle for each shower.   Please read over the following fact sheets that you were given: Pain Booklet, Coughing and Deep Breathing and Surgical Site Infection Prevention

## 2012-08-03 ENCOUNTER — Telehealth: Payer: Self-pay | Admitting: Internal Medicine

## 2012-08-03 ENCOUNTER — Other Ambulatory Visit (INDEPENDENT_AMBULATORY_CARE_PROVIDER_SITE_OTHER): Payer: Medicare Other

## 2012-08-03 DIAGNOSIS — I4891 Unspecified atrial fibrillation: Secondary | ICD-10-CM

## 2012-08-03 LAB — PROTIME-INR: INR: 1.1 ratio — ABNORMAL HIGH (ref 0.8–1.0)

## 2012-08-03 LAB — APTT: aPTT: 29.9 s — ABNORMAL HIGH (ref 21.7–28.8)

## 2012-08-03 NOTE — Telephone Encounter (Signed)
Triage,   Sending to you due to quick response needed. I am doing EBUS tomorrow 08/04/12. Noted 07/29/12 preop labs INR 1.75 and he is on xarelto, Per cardiology he should have stopped xarelto on satruday 08/01/12.  Please A) confirm with him last date for xarelto B) get him to come to office today and do pt, ptt again: I need it normal for preocedure tomorrow C) please have him stop aspirin today (if he took it that is fine)  If he cannot communicate, his sons are to be reached  Thanks  MR

## 2012-08-03 NOTE — Telephone Encounter (Signed)
  Lab 08/03/12 1446 07/29/12 1207  INR 1.1* 1.75*   Normal INR today. I will see him tomrrow for bronch

## 2012-08-03 NOTE — Telephone Encounter (Signed)
I lmomtcb on cell and home # for [employer].  Called, spoke with pt's other son, Council Mechanic.  Informed him of below per MR.  He verbalized understanding and will inform Russell.

## 2012-08-03 NOTE — Telephone Encounter (Signed)
Called, spoke with pt's son, Shawn [name].  States pt took the last dose of xarelto on Saturday.  He does not think pt has taken aspirin today and will ask him to to stop it.  Shawn [name] will have pt come in TODAY for PT/PTT check and is aware lab closes at 5:30 pm.  Labs have been entered as STAT.  Will sign off and route to MR as FYI.

## 2012-08-04 ENCOUNTER — Encounter (HOSPITAL_COMMUNITY): Payer: Self-pay | Admitting: Internal Medicine

## 2012-08-04 ENCOUNTER — Ambulatory Visit (HOSPITAL_COMMUNITY): Payer: Medicare Other | Admitting: Anesthesiology

## 2012-08-04 ENCOUNTER — Ambulatory Visit (HOSPITAL_COMMUNITY)
Admission: RE | Admit: 2012-08-04 | Discharge: 2012-08-04 | Disposition: A | Discharge status: Home / Self Care | Payer: Medicare Other | Source: Ambulatory Visit | Attending: Internal Medicine | Admitting: Internal Medicine

## 2012-08-04 ENCOUNTER — Encounter (HOSPITAL_COMMUNITY): Payer: Self-pay | Admitting: Anesthesiology

## 2012-08-04 ENCOUNTER — Ambulatory Visit (HOSPITAL_COMMUNITY): Payer: Medicare Other

## 2012-08-04 ENCOUNTER — Encounter (HOSPITAL_COMMUNITY)
Admission: RE | Disposition: A | Discharge status: Home / Self Care | Payer: Self-pay | Source: Ambulatory Visit | Attending: Internal Medicine

## 2012-08-04 DIAGNOSIS — R911 Solitary pulmonary nodule: Secondary | ICD-10-CM | POA: Insufficient documentation

## 2012-08-04 DIAGNOSIS — M129 Arthropathy, unspecified: Secondary | ICD-10-CM | POA: Insufficient documentation

## 2012-08-04 DIAGNOSIS — Z79899 Other long term (current) drug therapy: Secondary | ICD-10-CM | POA: Insufficient documentation

## 2012-08-04 DIAGNOSIS — J209 Acute bronchitis, unspecified: Secondary | ICD-10-CM | POA: Insufficient documentation

## 2012-08-04 DIAGNOSIS — I129 Hypertensive chronic kidney disease with stage 1 through stage 4 chronic kidney disease, or unspecified chronic kidney disease: Secondary | ICD-10-CM | POA: Insufficient documentation

## 2012-08-04 DIAGNOSIS — Z01810 Encounter for preprocedural cardiovascular examination: Secondary | ICD-10-CM | POA: Insufficient documentation

## 2012-08-04 DIAGNOSIS — I509 Heart failure, unspecified: Secondary | ICD-10-CM | POA: Insufficient documentation

## 2012-08-04 DIAGNOSIS — R222 Localized swelling, mass and lump, trunk: Secondary | ICD-10-CM

## 2012-08-04 DIAGNOSIS — R918 Other nonspecific abnormal finding of lung field: Secondary | ICD-10-CM

## 2012-08-04 DIAGNOSIS — N183 Chronic kidney disease, stage 3 unspecified: Secondary | ICD-10-CM | POA: Insufficient documentation

## 2012-08-04 DIAGNOSIS — I4891 Unspecified atrial fibrillation: Secondary | ICD-10-CM | POA: Insufficient documentation

## 2012-08-04 DIAGNOSIS — I252 Old myocardial infarction: Secondary | ICD-10-CM | POA: Insufficient documentation

## 2012-08-04 DIAGNOSIS — E7800 Pure hypercholesterolemia, unspecified: Secondary | ICD-10-CM | POA: Insufficient documentation

## 2012-08-04 DIAGNOSIS — Z01812 Encounter for preprocedural laboratory examination: Secondary | ICD-10-CM | POA: Insufficient documentation

## 2012-08-04 DIAGNOSIS — E119 Type 2 diabetes mellitus without complications: Secondary | ICD-10-CM | POA: Insufficient documentation

## 2012-08-04 DIAGNOSIS — K5730 Diverticulosis of large intestine without perforation or abscess without bleeding: Secondary | ICD-10-CM | POA: Insufficient documentation

## 2012-08-04 DIAGNOSIS — I2510 Atherosclerotic heart disease of native coronary artery without angina pectoris: Secondary | ICD-10-CM | POA: Insufficient documentation

## 2012-08-04 HISTORY — PX: ENDOBRONCHIAL ULTRASOUND: SHX5096

## 2012-08-04 HISTORY — PX: VIDEO BRONCHOSCOPY: SHX5072

## 2012-08-04 SURGERY — ENDOBRONCHIAL ULTRASOUND (EBUS)
Anesthesia: General | Site: Bronchus | Wound class: Clean Contaminated

## 2012-08-04 MED ORDER — LACTATED RINGERS IV SOLN
INTRAVENOUS | Status: DC
  Administered 2012-08-04: 50 mL/h via INTRAVENOUS

## 2012-08-04 MED ORDER — HYDROMORPHONE HCL PF 1 MG/ML IJ SOLN: 0.2500 mg | INTRAMUSCULAR | Status: DC | PRN

## 2012-08-04 MED ORDER — 0.9 % SODIUM CHLORIDE (POUR BTL) OPTIME
TOPICAL | Status: DC | PRN
  Administered 2012-08-04: 1000 mL

## 2012-08-04 MED ORDER — ROCURONIUM BROMIDE 100 MG/10ML IV SOLN
INTRAVENOUS | Status: DC | PRN
  Administered 2012-08-04: 40 mg via INTRAVENOUS

## 2012-08-04 MED ORDER — ASPIRIN 81 MG PO TABS: 81.0000 mg | ORAL_TABLET | ORAL | Status: DC

## 2012-08-04 MED ORDER — PROPOFOL 10 MG/ML IV BOLUS
INTRAVENOUS | Status: DC | PRN
  Administered 2012-08-04: 40 mg via INTRAVENOUS
  Administered 2012-08-04: 130 mg via INTRAVENOUS

## 2012-08-04 MED ORDER — ONDANSETRON HCL 4 MG/2ML IJ SOLN
INTRAMUSCULAR | Status: DC | PRN
  Administered 2012-08-04: 4 mg via INTRAVENOUS

## 2012-08-04 MED ORDER — LIDOCAINE HCL (PF) 1 % IJ SOLN
INTRAMUSCULAR | Status: DC | PRN
  Administered 2012-08-04: 5 mL

## 2012-08-04 MED ORDER — RIVAROXABAN 15 MG PO TABS: 15.0000 mg | ORAL_TABLET | Freq: Every day | ORAL | Status: DC

## 2012-08-04 MED ORDER — LIDOCAINE HCL (PF) 1 % IJ SOLN
INTRAMUSCULAR | Status: AC
  Filled 2012-08-04: qty 30

## 2012-08-04 MED ORDER — FENTANYL CITRATE 0.05 MG/ML IJ SOLN
INTRAMUSCULAR | Status: DC | PRN
  Administered 2012-08-04: 100 ug via INTRAVENOUS

## 2012-08-04 MED ORDER — GLYCOPYRROLATE 0.2 MG/ML IJ SOLN
INTRAMUSCULAR | Status: DC | PRN
  Administered 2012-08-04: .8 mg via INTRAVENOUS

## 2012-08-04 MED ORDER — NEOSTIGMINE METHYLSULFATE 1 MG/ML IJ SOLN
INTRAMUSCULAR | Status: DC | PRN
  Administered 2012-08-04: 5 mg via INTRAVENOUS

## 2012-08-04 MED ORDER — LIDOCAINE HCL (CARDIAC) 20 MG/ML IV SOLN
INTRAVENOUS | Status: DC | PRN
  Administered 2012-08-04: 80 mg via INTRAVENOUS

## 2012-08-04 SURGICAL SUPPLY — 21 items
BRUSH CYTOL CELLEBRITY 1.5X140 (MISCELLANEOUS) ×2 IMPLANT
CANISTER SUCTION 2500CC (MISCELLANEOUS) ×2 IMPLANT
CLOTH BEACON ORANGE TIMEOUT ST (SAFETY) ×2 IMPLANT
CONT SPEC 4OZ CLIKSEAL STRL BL (MISCELLANEOUS) ×2 IMPLANT
COVER TABLE BACK 60X90 (DRAPES) ×2 IMPLANT
FORCEPS BIOP RJ4 1.8 (CUTTING FORCEPS) ×2 IMPLANT
GLOVE BIOGEL M STRL SZ7.5 (GLOVE) ×2 IMPLANT
KIT ROOM TURNOVER OR (KITS) ×2 IMPLANT
MARKER SKIN DUAL TIP RULER LAB (MISCELLANEOUS) ×2 IMPLANT
NEEDLE BIOPSY TRANSBRONCH 21G (NEEDLE) IMPLANT
NEEDLE SYS SONOTIP II EBUSTBNA (NEEDLE) ×4 IMPLANT
NS IRRIG 1000ML POUR BTL (IV SOLUTION) ×2 IMPLANT
OIL SILICONE PENTAX (PARTS (SERVICE/REPAIRS)) ×2 IMPLANT
PAD ARMBOARD 7.5X6 YLW CONV (MISCELLANEOUS) ×4 IMPLANT
SPONGE GAUZE 4X4 12PLY (GAUZE/BANDAGES/DRESSINGS) ×2 IMPLANT
SYR 20CC LL (SYRINGE) ×2 IMPLANT
SYR 20ML ECCENTRIC (SYRINGE) ×4 IMPLANT
SYR 5ML LUER SLIP (SYRINGE) ×2 IMPLANT
TOWEL OR 17X24 6PK STRL BLUE (TOWEL DISPOSABLE) ×2 IMPLANT
TRAP SPECIMEN MUCOUS 40CC (MISCELLANEOUS) ×2 IMPLANT
TUBE CONNECTING 12X1/4 (SUCTIONS) ×2 IMPLANT

## 2012-08-04 NOTE — Transfer of Care (Signed)
Immediate Anesthesia Transfer of Care Note  Patient: Shawn [name]  Procedure(s) Performed: Procedure(s) (LRB) with comments: ENDOBRONCHIAL ULTRASOUND (N/A) VIDEO BRONCHOSCOPY WITH FLUORO (N/A)  Patient Location: PACU  Anesthesia Type:General  Level of Consciousness: awake, alert  and oriented  Airway & Oxygen Therapy: Patient Spontanous Breathing and Patient connected to nasal cannula oxygen  Post-op Assessment: Report given to PACU RN and Post -op Vital signs reviewed and stable  Post vital signs: Reviewed and stable  Complications: No apparent anesthesia complications

## 2012-08-04 NOTE — Interval H&P Note (Signed)
History and Physical Interval Note:  08/04/2012 9:22 AM  Shawn [name]  has presented today for surgery, with the diagnosis of lung mass  The various methods of treatment have been discussed with the patient and family. After consideration of risks, benefits and other options for treatment, the patient has consented to  Procedure(s) (LRB) with comments: ENDOBRONCHIAL ULTRASOUND (N/A) VIDEO BRONCHOSCOPY WITH FLUORO (N/A) as a surgical intervention .  The patient's history has been reviewed, patient examined, no change in status, stable for surgery.  I have reviewed the patient's chart and labs.  Questions were answered to the patient's satisfaction.     [name]  ekg 08/04/2012 looks unchanged from baseline. CHest pain atyprical. D/w Dr Doretha Sou of anesthesia. Will proceed with procedure. Confiormed he is off xarelto and aspirin. INR/Prothrombin Time:   Lab 08/04/12 0731 08/03/12 1446 07/29/12 1207  INR 1.03 1.1* 1.75*

## 2012-08-04 NOTE — Interval H&P Note (Signed)
.  .  .        xx

## 2012-08-04 NOTE — Anesthesia Postprocedure Evaluation (Signed)
  Anesthesia Post-op Note  Patient: Shawn [name]  Procedure(s) Performed: Procedure(s) (LRB) with comments: ENDOBRONCHIAL ULTRASOUND (N/A) VIDEO BRONCHOSCOPY WITH FLUORO (N/A)  Patient Location: PACU  Anesthesia Type:General  Level of Consciousness: awake  Airway and Oxygen Therapy: Patient Spontanous Breathing  Post-op Pain: mild  Post-op Assessment: Post-op Vital signs reviewed  Post-op Vital Signs: Reviewed  Complications: No apparent anesthesia complications

## 2012-08-04 NOTE — Anesthesia Procedure Notes (Addendum)
Procedure Name: Intubation Date/Time: 08/04/2012 9:33 AM Performed by: Marena Chancy Pre-anesthesia Checklist: Patient identified, Emergency Drugs available, Suction available, Patient being monitored and Timeout performed Patient Re-evaluated:Patient Re-evaluated prior to inductionOxygen Delivery Method: Circle system utilized Preoxygenation: Pre-oxygenation with 100% oxygen Intubation Type: IV induction Ventilation: Mask ventilation without difficulty and Oral airway inserted - appropriate to patient size Laryngoscope Size: Mac and 3 Grade View: Grade I Tube type: Oral Tube size: 8.5 mm Number of attempts: 1 Airway Equipment and Method: Stylet Placement Confirmation: ETT inserted through vocal cords under direct vision,  breath sounds checked- equal and bilateral and positive ETCO2 Secured at: 23 cm Tube secured with: Tape Dental Injury: Teeth and Oropharynx as per pre-operative assessment

## 2012-08-04 NOTE — Telephone Encounter (Signed)
I spoke with pt son and he is aware. Pt is getting EBUS done now. Carron Curie, CMA

## 2012-08-04 NOTE — H&P (View-Only) (Signed)
Subjective:    Patient ID: Shawn [name], male    DOB: [birth year], 78 y.o.   MRN: [id number]  HPI PCP is ARONSON,RICHARD A, MD   Body mass index is 29.71 kg/(m^2).  reports that he quit smoking about 23 years ago. He has never used smokeless tobacco.    IOV 07/20/12 78 year old pleasant male. Been having gastric symptioms on and offf since summer 2013. CT abdomen lung cut in July 2013 showed RLLL subpleural 1.5cm nodule. Had CT guided TTNA arrnaged but due to gastric issues this was placed on hold. This was follwed up with PET scan 06/29/12 that showed  1)  hypermetabolic nodule within the left parotid gland which  measures 8 mm (image 31) with SUV max = 5.7.   2) Chest: There is a left suprahilar mass surrounding the left upper  lobe bronchus measuring approximately 22 x 16 mm with SUV max = the  19.3. Adjacent hypermetabolic AP window node is difficult to see  on the CT portion and measures approximately 30 mm with SUV max =  16.2.    3) There is a focus of peripheral ground-glass opacity and pleural thickening in the superior left upper lobe mearuring12 mm x 12 mm  (image 74) with S UV max 5.4.   4) No PET lightting of the RLL nodule seen in July 2013 and still seen on PET CT 06/29/12  5) No additional hypermetabolic mediastinal lymph nodes.   6) There is hypermetabolic activity associated with  the left adrenal gland with SUV max = 4.5. Adrenal gland on the CT  portion is mildly full but not abnormally enlarged.    Currently main respiratory symptom is chronic cough for several months with some sputum. Denies acid reflux of sinus drainge. Noted he is on ACE inhibitors.     Past Medical History  Diagnosis Date  . CAD (coronary artery disease)     a. 1990 CABGx3: LIMA->LAD, VG->OM, VG->dRCA;  b. 05/2007 VF Arrest/Cath/PCI: 100 VG->OM tx w/ BMS, 95 VG->RCA tx w/ bms;  c. 06/2007 90 Native PDA tx w DES ;  d. 05/2009 Echo EF 60-65%, nl WM, mild MR; e. normal lexiscan myoview  03/30/12  . HTN (hypertension)   . Hypercholesteremia   . Diabetes mellitus   . Atrial fibrillation     a. had been on tikosyn -> d/c 09/2010;  b. Was on Amio ->d/c 09/2011;  c.  anticoagulated w/ Pradaxa  . Junctional ectopic tachycardia     a. noted 09/2010  . Sinus bradycardia   . CKD (chronic kidney disease), stage III   . Back pain     a. Followed by [facility] - on prednisone  . Prostate cancer   . Unsteady gait     a. in rehab for core strengthening  . Colitis   . Tubular adenoma of colon 02/2007  . Myocardial infarction   . Arthritis   . CHF (congestive heart failure)   . Depression   . Internal hemorrhoids without mention of complication   . Diverticulosis of colon (without mention of hemorrhage)      Family History  Problem Relation Age of Onset  . Other Father     died of unknown cause @ young age  . Coronary artery disease Mother     died in late 55's w/ "hardening of the arteries"  . Heart disease Mother   . Heart disease Son   . Diabetes Son   . Colon cancer Neg Hx  History   Social History  . Marital Status: Widowed    Spouse Name: N/A    Number of Children: 4  . Years of Education: N/A   Occupational History  . 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 Smoking status: Former Smoker -- 40 years    Quit date: 09/23/1988  . Smokeless tobacco: Never Used   Comment: quit 20+ years ago  . Alcohol Use: No  . Drug Use: No  . Sexually Active: No   Other Topics Concern  . Not on file   Social History Narrative   The patient resides in [place] alone.  He is widowed.  He has 4 children, 9 grandchildren.  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  He denies any alcohol, drugs, or herbal medication.  He tries  to maintain a low-fat diet.  He states that he does exercise somewhat with walking, and he uses a stationary bike for a few minutes every other day.        No Known Allergies   Outpatient  Prescriptions Prior to Visit  Medication Sig Dispense Refill  . alum & mag hydroxide-simeth (MAALOX/MYLANTA) 200-200-20 MG/5ML suspension Take 30 mLs by mouth every 6 (six) hours as needed for indigestion.  355 mL  0  . aspirin 81 MG tablet Take 81 mg by mouth every other day.       . furosemide (LASIX) 20 MG tablet Take 20 mg by mouth daily.      Marland Kitchen glipiZIDE (GLUCOTROL) 10 MG tablet Take 10 mg by mouth daily.        . Lansoprazole (PREVACID PO) Take 1 tablet by mouth daily.      Marland Kitchen levothyroxine (SYNTHROID, LEVOTHROID) 75 MCG tablet Take 75 mcg by mouth daily.      Marland Kitchen lisinopril (PRINIVIL,ZESTRIL) 10 MG tablet Take 10 mg by mouth daily.      . Multiple Vitamin (MULTIVITAMIN) tablet Take 1 tablet by mouth daily.        . nitroGLYCERIN (NITROSTAT) 0.4 MG SL tablet Place 0.4 mg under the tongue every 5 (five) minutes as needed. For chest pain      . potassium chloride (K-DUR,KLOR-CON) 10 MEQ tablet Take 10 mEq by mouth daily.      . Rivaroxaban (XARELTO) 15 MG TABS tablet Take 15 mg by mouth daily.      . rosuvastatin (CRESTOR) 10 MG tablet Take 10 mg by mouth daily.           Review of Systems  Constitutional: Negative for fever and unexpected weight change.  HENT: Positive for congestion. Negative for ear pain, nosebleeds, sore throat, rhinorrhea, sneezing, trouble swallowing, dental problem, postnasal drip and sinus pressure.   Eyes: Negative for redness and itching.  Respiratory: Positive for cough and shortness of breath. Negative for chest tightness and wheezing.   Cardiovascular: Negative for palpitations and leg swelling.  Gastrointestinal: Positive for abdominal pain. Negative for nausea and vomiting.  Genitourinary: Negative for dysuria.  Musculoskeletal: Negative for joint swelling.  Skin: Negative for rash.  Neurological: Negative for headaches.  Hematological: Does not bruise/bleed easily.  Psychiatric/Behavioral: Positive for dysphoric mood. The patient is not  nervous/anxious.        Objective:   Physical Exam  Nursing note and vitals reviewed. Constitutional: He is oriented to person, place, and time. He appears well-developed and well-nourished. No distress.  HENT:  Head: Normocephalic and atraumatic.  Right Ear: External ear normal.  Left Ear: External ear normal.  Mouth/Throat: Oropharynx is clear and moist. No oropharyngeal exudate.  Eyes: Conjunctivae normal and EOM are normal. Pupils are equal, round, and reactive to light. Right eye exhibits no discharge. Left eye exhibits no discharge. No scleral icterus.  Neck: Normal range of motion. Neck supple. No JVD present. No tracheal deviation present. No thyromegaly present.  Cardiovascular: Normal rate, regular rhythm and intact distal pulses.  Exam reveals no gallop and no friction rub.   No murmur heard. Pulmonary/Chest: Effort normal and breath sounds normal. No respiratory distress. He has no wheezes. He has no rales. He exhibits no tenderness.  Abdominal: Soft. Bowel sounds are normal. He exhibits no distension and no mass. There is no tenderness. There is no rebound and no guarding.  Musculoskeletal: Normal range of motion. He exhibits no edema and no tenderness.  Lymphadenopathy:    He has no cervical adenopathy.  Neurological: He is alert and oriented to person, place, and time. He has normal reflexes. No cranial nerve deficit. Coordination normal.  Skin: Skin is warm and dry. No rash noted. He is not diaphoretic. No erythema. No pallor.  Psychiatric: He has a normal mood and affect. His behavior is normal. Judgment and thought content normal.          Assessment & Plan:

## 2012-08-04 NOTE — Op Note (Signed)
Name:  [name] MRN:  [id number] DOB:  [birth year]  PROCEDURE NOTE  Procedure(s): Flexible bronchoscopy [phone]) Brushing (29528) of the LEFT MAIN BRONCHUS  Endobronchial biopsy (41324) of the LEFT MAIN BRONCHUS Endobronchial ultrasound (40102) Transbronchial needle aspiration (72536) of the 10L LYMPH NODE STATION ALONG LEFT MAIN BRONCHUS   Indications:  Hilar / mediastinal lymphadenopathy., Lung Mass, Adrenal Mass. Suspected lung cancer  Consent:  Procedure, benefits, risks and alternatives discussed.  Questions answered.  Consent obtained.  Anesthesia:  General endotracheal.  Procedure summary:  Appropriate equipment was assembled.  The patient was brought to the operating room and identified as Shawn [name].  Safety timeout was performed. The patient was placed supine on the operating table, airway established and general anesthesia administered by Anesthesia team.   After the appropriate level of anesthesia was assured, flexible video bronchoscope was lubricated and inserted through the endotracheal tube.   Airway examination was performed bilaterally to subsegmental level.  Minimal clear secretions were noted, mucosa appeared normal and no endobronchial lesions were identified. EXCEPT  IN AREA OF LEFT MAIN BRONCHUS CRANIALLY AND POSTERIORLY JUST PROXIMAL TO THE LUL TAKE OFF MUCOSA LOOKED ABNORMAL AND FRIABLE BUT NO OBVIOUS ENDOBRONCHIAL LESIONS   Endobronchial ultrasound video bronchoscope was then lubricated and inserted through the endotracheal tube. Surveillance of the mediastinal and and bilateral hilar lymph node stations was performed.  Pathologically enlarged lymph nodes were noted AND FOUND ONLY ALONG THE LEFT MAIN BRONCHUS 10L STATION.  Endobronchial ultrasound guided transbronchial needle aspiration of 10L  (passes X 2) DONE AND SENT FOR ONSITE CYTOLOGY BUT RETURNED AS NON DIAGNOSTIC WITH EPITHELIAL CELLS. IT WAS TECHNICALLY CHALLENGING TO TO LOCATE THE NODES.  AFTER  THIS THE FLEX BRONCH WAS REINTRODUCED (EBUS REMOVED FIRST) AND ENDOBRONCHIAL BIOPSY X 4 OF THE LUL MUCOSA DONE FOLLOWED BY BRUSHING X 1   FLEX BRONCH WITHDRAWN AND EBUS REINTRODUCED. MASS IDENTIFIED AGAIN AND EBUS X 3 DONE OF THE 10L STATION ANTERIORLY (BY PULM ART) WITHOUT BLEEDING. THEN POSTERIORLY EBUS X 1 DONE                                                 Flexible video bronchoscope was used again to ensure hemostasis. Total of 5 mL of 1% Lidocaine were administered through the bronchoscope to augment general anesthesia. The patient was extubated in operating room and transferred to PACU. Post-procedure chest x-ray was not ordered.  Specimens sent: Endobronchial biopsy of Left Main Bronchus Brushings of left main bronchus EBUS TBNA of the 10L node  Complications:  No immediate complications were noted.  Hemodynamic parameters and oxygenation remained stable throughout the procedure.  Estimated blood loss:  Less then 5 mL.  Dr. Kalman Shan, M.D., [facility].C.P Pulmonary and Critical Care Medicine Staff Physician [facility] Pager: [phone], If no answer or between  15:00h - 7:00h: call [phone]  08/04/2012 11:06 AM

## 2012-08-04 NOTE — Preoperative (Signed)
Beta Blockers   Reason not to administer Beta Blockers:Not Applicable 

## 2012-08-04 NOTE — Interval H&P Note (Signed)
History and Physical Interval Note:  08/04/2012 9:02 AM  Shawn [name]  has presented today for surgery, with the diagnosis of lung mass  The various methods of treatment have been discussed with the patient and family. After consideration of risks, benefits and other options for treatment, the patient has consented to  Procedure(s) (LRB) with comments: ENDOBRONCHIAL ULTRASOUND (N/A) VIDEO BRONCHOSCOPY WITH FLUORO (N/A) as a surgical intervention .  The patient's history has been reviewed, patient examined, no change in status, stable for surgery.  I have reviewed the patient's chart and labs.  Questions were answered to the patient's satisfaction.    Note: this am at short stay (hx from him and son Verlon Au) he had 2 episodes of chest pain. Both were central in chest and mild without radiation. Both happened when he was made supine and immediately resolved when seated. They both think this is due to gERD issues. His recently has been cleared by cardiology for this procedure and labs reviewed including troponin and ekg which were normal/unchanged. We will get EKG now and if ok go ahead with procedure   Risks of pneumothorax, hemothorax, sedation/anesthesia complications such as cardiac or respiratory arrest or hypotension, stroke and bleeding all explained. Benefits of diagnosis but limitations of non-diagnosis also explained. Patient verbalized understanding and wished to proceed.   Dr. Kalman Shan, M.D., [facility].C.P Pulmonary and Critical Care Medicine Staff Physician [facility] [place] [facility] Pager: [phone], If no answer or between  15:00h - 7:00h: call [phone]  08/04/2012 9:04 AM

## 2012-08-04 NOTE — Anesthesia Preprocedure Evaluation (Signed)
Anesthesia Evaluation  Patient identified by MRN, date of birth, ID band Patient awake    Reviewed: Allergy & Precautions, H&P , NPO status , Patient's Chart, lab work & pertinent test results  Airway Mallampati: II      Dental   Pulmonary  breath sounds clear to auscultation        Cardiovascular hypertension, + CAD, + Past MI and +CHF Rhythm:Regular Rate:Normal     Neuro/Psych Anxiety    GI/Hepatic GERD-  ,  Endo/Other  diabetes  Renal/GU Renal disease     Musculoskeletal   Abdominal   Peds  Hematology   Anesthesia Other Findings   Reproductive/Obstetrics                           Anesthesia Physical Anesthesia Plan  ASA: IV  Anesthesia Plan: General   Post-op Pain Management:    Induction: Intravenous  Airway Management Planned: Oral ETT  Additional Equipment:   Intra-op Plan:   Post-operative Plan: Extubation in OR  Informed Consent: I have reviewed the patients History and Physical, chart, labs and discussed the procedure including the risks, benefits and alternatives for the proposed anesthesia with the patient or authorized representative who has indicated his/her understanding and acceptance.   Dental advisory given  Plan Discussed with: CRNA and Anesthesiologist  Anesthesia Plan Comments:         Anesthesia Quick Evaluation

## 2012-08-05 ENCOUNTER — Encounter (HOSPITAL_COMMUNITY): Payer: Self-pay | Admitting: Internal Medicine

## 2012-08-10 ENCOUNTER — Encounter: Payer: Self-pay | Admitting: Internal Medicine

## 2012-08-10 ENCOUNTER — Ambulatory Visit (INDEPENDENT_AMBULATORY_CARE_PROVIDER_SITE_OTHER): Payer: Medicare Other | Admitting: Internal Medicine

## 2012-08-10 ENCOUNTER — Telehealth: Payer: Self-pay | Admitting: Internal Medicine

## 2012-08-10 ENCOUNTER — Telehealth: Payer: Self-pay | Admitting: *Deleted

## 2012-08-10 VITALS — BP 108/68 | HR 67 | Ht 71.0 in | Wt 210.0 lb

## 2012-08-10 DIAGNOSIS — C3490 Malignant neoplasm of unspecified part of unspecified bronchus or lung: Secondary | ICD-10-CM

## 2012-08-10 NOTE — Patient Instructions (Addendum)
You have lung cancer - which is likely of the small cell type Please see Dr Arbutus Ped for treatment options I will see you back in  2 months to help support you through the course of your illness

## 2012-08-10 NOTE — Progress Notes (Signed)
  Subjective:    Patient ID: Shawn [name], male    DOB: 1928/07/24, 78 y.o.   MRN: [id number]  HPI  Here with sons to discuss biopsy results  - LMB mass shows poorly differentiated carcinoma; favoring small cell   He has PET hot lesion in left main bronchus area, Left chest near chest wall and left adrenal. Of note, thee was a PET hot left parotid gland lesion that was seen by ENT Dr Lazarus Salines and in discussion with IR felt too small to biopsy  Dx is c/w Extensive STage small cell CA  Review of Systems  Constitutional: Negative for fever and unexpected weight change.  HENT: Positive for sneezing. Negative for ear pain, nosebleeds, congestion, sore throat, rhinorrhea, trouble swallowing, dental problem, postnasal drip and sinus pressure.   Eyes: Positive for redness. Negative for itching.  Respiratory: Positive for cough, chest tightness and shortness of breath. Negative for wheezing.   Cardiovascular: Negative for palpitations and leg swelling.  Gastrointestinal: Positive for vomiting. Negative for nausea.  Genitourinary: Negative for dysuria.  Musculoskeletal: Negative for joint swelling.  Skin: Negative for rash.  Neurological: Negative for headaches.  Hematological: Bruises/bleeds easily.  Psychiatric/Behavioral: Positive for dysphoric mood. The patient is nervous/anxious.        Objective:   Physical Exam  Discussion only visit Filed Vitals:   08/10/12 1014  BP: 108/68  Pulse: 67  Height: 5\' 11"  (1.803 m)  Weight: 95.255 kg (210 lb)  SpO2: 97%   plesaant Normal cognitiion Hard of hearing Normal gait      Assessment & Plan:

## 2012-08-10 NOTE — Telephone Encounter (Signed)
Spoke with son regarding appt for [facility] 08/13/12 at 2:00 and arrive at 1:45.  He verbalized understanding of time and place of appt

## 2012-08-11 ENCOUNTER — Ambulatory Visit: Payer: Medicare Other | Admitting: Cardiology

## 2012-08-13 ENCOUNTER — Encounter: Payer: Self-pay | Admitting: Internal Medicine

## 2012-08-13 ENCOUNTER — Ambulatory Visit
Admission: RE | Admit: 2012-08-13 | Discharge: 2012-08-13 | Disposition: A | Discharge status: Home / Self Care | Payer: Medicare Other | Source: Ambulatory Visit | Attending: Radiation Oncology | Admitting: Radiation Oncology

## 2012-08-13 ENCOUNTER — Encounter: Payer: Self-pay | Admitting: Radiation Oncology

## 2012-08-13 ENCOUNTER — Encounter: Payer: Self-pay | Admitting: *Deleted

## 2012-08-13 ENCOUNTER — Ambulatory Visit (HOSPITAL_BASED_OUTPATIENT_CLINIC_OR_DEPARTMENT_OTHER): Payer: Medicare Other | Admitting: Internal Medicine

## 2012-08-13 VITALS — BP 130/67 | HR 80 | Temp 97.8°F | Resp 18 | Ht 71.0 in | Wt 210.0 lb

## 2012-08-13 DIAGNOSIS — C3490 Malignant neoplasm of unspecified part of unspecified bronchus or lung: Secondary | ICD-10-CM

## 2012-08-13 DIAGNOSIS — C3400 Malignant neoplasm of unspecified main bronchus: Secondary | ICD-10-CM

## 2012-08-13 DIAGNOSIS — I4891 Unspecified atrial fibrillation: Secondary | ICD-10-CM

## 2012-08-13 NOTE — Assessment & Plan Note (Signed)
Diagnosis of lung cancer given Histology favoring small cell lung cancer explained Staging as likely extensive and fact 70% patients present with extensive stage explained Explained that not surgical candidate Explained will need platinum based chemo depending on dR Mohamed eval Explained role of radiology likelty to be in post chemo setting for whole brain radiation to prevent recurrence; Dr Shirline Frees to evaluate No questions from family on prognosis  > 50% of this 15 min visit spent in face to face counseling

## 2012-08-13 NOTE — Progress Notes (Signed)
[facility]  Clinical Social Work  Clinical Social Work met with patient and patients son Shawn [name] in [place] today with Dr. Arbutus Ped. Dr. Arbutus Ped discussed patient's diagnosis and treatment plan. Patient reports his greatest concern is how his body will handle chemoradiation treatment.  Patient's son asked many questions and shared they have a large, supportive family.  Shawn [name] lives with one son in a two story home.  He enjoys "going to the office" with his family and is quite active. CSW explained CSW role and support services at the cancer center and encouraged patient and family to call with any questions or concerns.  Kathrin Penner, MSW, LCSW Clinical Social Worker [facility] [phone]

## 2012-08-13 NOTE — Progress Notes (Signed)
Radiation Oncology         [phone]) [phone] ________________________________  Initial outpatient Consultation  Name: Shawn [name] MRN: [id number]  Date: 08/13/2012  DOB: 1928-03-04  WU:[id number],NWGNFAO A, MD  Kalman Shan, MD   REFERRING PHYSICIAN: Kalman Shan, MD  DIAGNOSIS: The primary encounter diagnosis was Lung cancer. A diagnosis of Poorly differentiated lung carcinoma, favoring extensive stage small cell lung cancer - Nov 2013, ECOG 1 was also pertinent to this visit.  HISTORY OF PRESENT ILLNESS::Shawn [name] is a 78 y.o. male who is  Is seen out of the courtesy of Dr. Marchelle Gearing for an opinion concerning radiation therapy for what appears to be small cell lung cancer, limited versus extensive stage. Earlier this year the patient underwent a cholecystectomy. As part of his preoperative workup a chest x-ray revealed a possible mass in the left suprahilar area.  a subsequent PET scan showed a hypermetabolic left suprahilar mass which constricted the left upper lobe bronchus. There was nodal metastasis  to the ipsilateral AP window region there is also a hypermetabolic lesion along the chest wall area in the left lower lobe region. This was felt to be postobstructive pneumonitis versis tumor. In addition there was some focal activity in the left adrenal gland which was felt to be low suspicion for metastasis. Finally a area in the left parotid gland was noted. Patient was seen by Dr. Lazarus Salines who recommended following this area without biopsy or treatment. Patient did undergo bronchoscopy and biopsy of the left mainstem region which revealed poorly differentiated carcinoma favoring small cell carcinoma.  Patient is now seen in the multidisciplinary thoracic clinic along with Dr. Arbutus Ped.  PREVIOUS RADIATION THERAPY: No  PAST MEDICAL HISTORY:  has a past medical history of CAD (coronary artery disease); HTN (hypertension); Hypercholesteremia; Atrial fibrillation; Junctional ectopic  tachycardia; Sinus bradycardia; Back pain; Prostate cancer; Unsteady gait; Colitis; Tubular adenoma of colon (02/2007); Myocardial infarction; Arthritis; CHF (congestive heart failure); Depression; Internal hemorrhoids without mention of complication; Diverticulosis of colon (without mention of hemorrhage); Diabetes mellitus; CKD (chronic kidney disease), stage III; and GERD (gastroesophageal reflux disease).    PAST SURGICAL HISTORY: Past Surgical History  Procedure Date  . Coronary artery bypass graft 1990  . Prostate surgery 01/1992    retropubic prostatectomy  . Cardiac valve surgery   . Coronary angioplasty with stent placement 2008    x 3   . Rt finger trigger a-1 pulley   . Cholecystectomy 05/05/12  . Cholecystectomy 05/05/2012    Procedure: LAPAROSCOPIC CHOLECYSTECTOMY WITH INTRAOPERATIVE CHOLANGIOGRAM;  Surgeon: Mariella Saa, MD;  Location: [facility] OR;  Service: General;  Laterality: N/A;  . Endobronchial ultrasound 08/04/2012    Procedure: ENDOBRONCHIAL ULTRASOUND;  Surgeon: Kalman Shan, MD;  Location: [facility] OR;  Service: Cardiopulmonary;  Laterality: N/A;  . Video bronchoscopy 08/04/2012    Procedure: VIDEO BRONCHOSCOPY WITH FLUORO;  Surgeon: Kalman Shan, MD;  Location: [facility] OR;  Service: Cardiopulmonary;  Laterality: N/A;    FAMILY HISTORY: family history includes Coronary artery disease in his mother; Diabetes in his son; Heart disease in his mother and son; and Other in his father.  There is no history of Colon cancer.  SOCIAL HISTORY:  reports that he quit smoking about 23 years ago. His smoking use included Cigarettes. He quit after 40 years of use. He has never used smokeless tobacco. He reports that he does not drink alcohol or use illicit drugs.  ALLERGIES: Review of patient's allergies indicates no known allergies.  MEDICATIONS:  Current Outpatient  Prescriptions  Medication Sig Dispense Refill  . alum & mag hydroxide-simeth (MAALOX/MYLANTA) 200-200-20 MG/5ML  suspension Take 30 mLs by mouth every 6 (six) hours as needed. For indigestion      . aspirin 81 MG tablet Take 1 tablet (81 mg total) by mouth every other day.  30 tablet  1  . furosemide (LASIX) 20 MG tablet Take 20 mg by mouth daily.      Marland Kitchen glipiZIDE (GLUCOTROL) 10 MG tablet Take 10 mg by mouth daily.        Marland Kitchen levothyroxine (SYNTHROID, LEVOTHROID) 75 MCG tablet Take 75 mcg by mouth daily.      . Linaclotide (LINZESS) 145 MCG CAPS Take 145 mcg by mouth every other day.      . lisinopril (PRINIVIL,ZESTRIL) 10 MG tablet Take 10 mg by mouth daily.      . metoCLOPramide (REGLAN) 5 MG/5ML solution Take 5 mg by mouth 4 (four) times daily -  before meals and at bedtime. Prn only      . Multiple Vitamin (MULTIVITAMIN) tablet Take 1 tablet by mouth daily.        . nitroGLYCERIN (NITROSTAT) 0.4 MG SL tablet Place 0.4 mg under the tongue every 5 (five) minutes as needed. For chest pain      . potassium chloride (K-DUR,KLOR-CON) 10 MEQ tablet Take 10 mEq by mouth daily.      . Rivaroxaban (XARELTO) 15 MG TABS tablet Take 1 tablet (15 mg total) by mouth daily.  30 tablet  1  . rosuvastatin (CRESTOR) 10 MG tablet Take 10 mg by mouth daily.      . [DISCONTINUED] metoCLOPramide (REGLAN) 5 MG/5ML solution Take 5 mLs (5 mg total) by mouth 4 (four) times daily -  before meals and at bedtime.  90 mL  0  . [DISCONTINUED] rivastigmine (EXELON) 4.6 mg/24hr Place 1 patch onto the skin daily.        REVIEW OF SYSTEMS:  A 15 point review of systems is documented in the electronic medical record. This was obtained by the nursing staff. However, I reviewed this with the patient to discuss relevant findings and make appropriate changes.  He denies any headaches dizziness or blurred vision. Patient denies any pain within the chest area cough or hemoptysis. He denies any new bony pain.   PHYSICAL EXAM:  height is 5\' 11"  (1.803 m) and weight is 210 lb (95.255 kg). His oral temperature is 97.8 F (36.6 C). His blood pressure  is 130/67 and his pulse is 80. His respiration is 18 and oxygen saturation is 94%.  in Gen. this is a very pleasant 78 year old gentleman in no acute distress. He is accompanied by his son on evaluation today. Examination of the pupils reveals him to be equal round and reactive to light. The extraocular eye movements are intact. The tongue is midline. There is no secondary infection noted the oral cavity or posterior pharynx. Examination of the neck parotid and supraclavicular region reveals no evidence of adenopathy. The lungs are cared auscultation.  the heart has a regular rhythm and rate. There is no axillary adenopathy appreciated. The abdomen is soft and nontender with normal bowel sounds. Patient has a scar along the chest area from prior CABG. on neurological examination motor strength is 5 out of 5 in the proximal and distal muscle groups in the upper lower extremities. Peripheral pulses appear adequate. The patient has mild edema and ankle and foot areas.   LABORATORY DATA:  Lab Results  Component Value  Date   WBC 10.8* 07/29/2012   HGB 12.8* 07/29/2012   HCT 38.5* 07/29/2012   MCV 84.1 07/29/2012   PLT 247 07/29/2012   Lab Results  Component Value Date   NA 137 07/29/2012   K 4.2 07/29/2012   CL 103 07/29/2012   CO2 23 07/29/2012   Lab Results  Component Value Date   ALT 29 07/29/2012   AST 35 07/29/2012   ALKPHOS 117 07/29/2012   BILITOT 0.8 07/29/2012     RADIOGRAPHY: Dg Chest 2 View Within Previous 72 Hours.  Films Obtained On Friday Are Acceptable For Monday And Tuesday Cases  08/04/2012  *RADIOLOGY REPORT*  Clinical Data: Endobronchial ultrasound preoperative film  CHEST - 2 VIEW  Comparison: 04/10/2012  Findings: Hyperinflation indicates COPD.  The patient is status post median sternotomy.  Heart size and vascular pattern are normal.  Lungs are clear except for a stable calcified granuloma in the medial right upper lobe measuring about 7 mm.  IMPRESSION: No acute findings.   Original  Report Authenticated By: Esperanza Heir, M.D.    Ct Abdomen Pelvis W Contrast  07/17/2012  *RADIOLOGY REPORT*  Clinical Data: Abdominal pain.  Recent cholecystectomy.  CT ABDOMEN AND PELVIS WITH CONTRAST  Technique:  Multidetector CT imaging of the abdomen and pelvis was performed following the standard protocol during bolus administration of intravenous contrast.  Contrast: OMNIPAQUE IOHEXOL 300 MG/ML  SOLN  Comparison: PET CT 06/29/2012 and earlier studies  Findings: Bilateral coronary calcifications.  Stable semisolid nodule in the posterior right lower lobe. Vascular clips in the gallbladder fossa.  No new liver lesion.  Splenic granuloma. Normal adrenal glands.  Mild pancreatic parenchymal atrophy.  Focal atrophy in the lower pole left kidney.  No hydronephrosis.  Stomach and small bowel are nondilated.  Colon is nondilated with multiple sigmoid diverticula; no adjacent inflammatory/edematous change. Urinary bladder incompletely distended.  No ascites.  No free air. Portal vein patent.  Extensive aortoiliac arterial plaque.  No adenopathy.  Multilevel spondylitic changes in the lumbar spine. Previous prostatectomy.  IMPRESSION: 1.  No acute abdominal process. 2. Sigmoid diverticulosis. 3.  Atherosclerosis, including aortoiliac and coronary artery disease. Please note that although the presence of coronary artery calcium documents the presence of coronary artery disease, the severity of this disease and any potential stenosis cannot be assessed on this non-gated CT examination.  Assessment for potential risk factor modification, dietary therapy or pharmacologic therapy may be warranted, if clinically indicated.   Original Report Authenticated By: Osa Craver, M.D.       IMPRESSION: Probable limited stage small cell lung cancer. As above the lesions in the left parotid and adrenal region do not appear to be of significant consequence. Given this stage I would recommend radiation therapy  along with radiosensitizing chemotherapy. I discussed the overall treatment course side effects and potential toxicities of radiation therapy in this situation with the patient and his son. The patient appears to understand and wishes to proceed with planned course of treatment.  PLAN: Simulation and planning on 08/17/2012. The patient will began his combined modality therapy first week in December.   ------------------------------------------------   Billie Lade, PhD, MD

## 2012-08-13 NOTE — Progress Notes (Signed)
[facility] Telephone:(336) [phone]   Fax:([phone]) [phone]  CONSULT NOTE  REASON FOR CONSULTATION:  78 years old white male diagnosed with lung cancer.  HPI Shawn [name] is a 78 y.o. male was past medical history significant for multiple medical problems including history of coronary artery disease status post CABG in 1990, status post stent placement, history of hypertension, dyslipidemia, diabetes mellitus, atrial fibrillation, chronic kidney disease, history of prostate cancer status post surgical resection in 1994, congestive heart failure, depression and colitis. The patient has a history of heavy smoking but quit 25 years ago. In July 2013 he presented to the emergency Department for evaluation of nausea and abdominal pain as well as loss of appetite. CT of the abdomen and pelvis performed on 04/10/2012 showed: ICAs is without radiographic evidence of acute cholecystitis in addition to sigmoid diverticular disease without evidence of acute inflammation but there was also a 1.5 CM semisolid pulmonary nodule associated with a lesion of anemia or atelectasis versus scarring in the posterior right lower lobe concerning for primary bronchogenic carcinoma and CT/PET was recommended. On 05/05/2012 the patient underwent a laparoscopic cholecystectomy with intraoperative cholangiogram under the care of Dr. Johna Sheriff. After recovery from his surgery he had a PET scan performed on 06/29/2012 and it showed a left suprahilar mass surrounding the left upper lobe bronchus measuring 2.2 x 1.6 CM with SUV max of 19.3. There was an adjacent hypermetabolic AP window node measured approximately 3.0 CM with SUV max of 16.2. There was a focus of peripheral groundglass opacity and pleural thickening in the superior left upper lobe measuring 1.2 x 1.2 CM with SUV max of 5.4. 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 CT of the abdomen and pelvis on 07/17/2012 showed no acute abdominal process. The patient was seen by Dr. Marchelle Gearing and on 08/04/2012 he underwent flexible bronchoscopy, brushing of the left mainstem bronchus, endobronchial biopsy of the left main bronchus, endobronchial ultrasound and transbronchial needle aspiration of the 10 L lymph node station along the left main bronchus. The final pathology showed poorly differentiated carcinoma. The tissue is scant, hindering optimal histologic evaluation. By immunohistochemical stains, the malignant cells are positive for cytokeratin and TTF-1 and negative for CD56, CD20, chromogranin, and synaptophysin. This immunohistochemical profile is relatively non-specific although the histologic features would favor small cell carcinoma. The patient was referred to me today at the multidisciplinary thoracic oncology clinic [facility]) for further evaluation and recommendation regarding his condition.  He is feeling fine today with no specific complaints except for mild fatigue and anxiety. The patient denied having any significant chest pain, shortness breath, cough or hemoptysis. He denied having any significant weight loss or night sweats. He has no visual changes or headache. He has no nausea or vomiting. His family history significant for heart disease. The patient is widowed and has 4 children. He was accompanied today by his son [employer] Mechanic. 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 He has a history of smoking 2 pack per day for around 40 years but quit 25 years ago. He also quit alcohol 25 years ago and no history of drug abuse.  @SFHPI @  Past Medical History  Diagnosis Date  . CAD (coronary artery disease)     a. 1990 CABGx3: LIMA->LAD, VG->OM, VG->dRCA;  b. 05/2007 VF Arrest/Cath/PCI: 100 VG->OM tx w/ BMS, 95  VG->RCA tx w/ bms;  c. 06/2007 90 Native PDA tx w DES ;  d. 05/2009 Echo  EF 60-65%, nl WM, mild MR; e. normal lexiscan myoview 03/30/12  . HTN (hypertension)   . Hypercholesteremia   . Atrial fibrillation     a. had been on tikosyn -> d/c 09/2010;  b. Was on Amio ->d/c 09/2011;  c.  anticoagulated w/ Pradaxa  . Junctional ectopic tachycardia     a. noted 09/2010  . Sinus bradycardia   . Back pain     a. Followed by [facility] - on prednisone  . Prostate cancer   . Unsteady gait     a. in rehab for core strengthening  . Colitis   . Tubular adenoma of colon 02/2007  . Myocardial infarction   . Arthritis   . CHF (congestive heart failure)   . Depression   . Internal hemorrhoids without mention of complication   . Diverticulosis of colon (without mention of hemorrhage)   . Diabetes mellitus     Type 2 NIDDM x 15 years  . CKD (chronic kidney disease), stage III     pt denies CKD  . GERD (gastroesophageal reflux disease)     Past Surgical History  Procedure Date  . Coronary artery bypass graft 1990  . Prostate surgery 01/1992    retropubic prostatectomy  . Cardiac valve surgery   . Coronary angioplasty with stent placement 2008    x 3   . Rt finger trigger a-1 pulley   . Cholecystectomy 05/05/12  . Cholecystectomy 05/05/2012    Procedure: LAPAROSCOPIC CHOLECYSTECTOMY WITH INTRAOPERATIVE CHOLANGIOGRAM;  Surgeon: Mariella Saa, MD;  Location[facility] OR;  Service: General;  Laterality: N/A;  . Endobronchial ultrasound 08/04/2012    Procedure: ENDOBRONCHIAL ULTRASOUND;  Surgeon: Kalman Shan, MD;  Location: [facility] OR;  Service: Cardiopulmonary;  Laterality: N/A;  . Video bronchoscopy 08/04/2012    Procedure: VIDEO BRONCHOSCOPY WITH FLUORO;  Surgeon: Kalman Shan, MD;  Location: [facility] OR;  Service: Cardiopulmonary;  Laterality: N/A;    Family History  Problem Relation Age of Onset  . Other Father     died of unknown cause @ young age  . Coronary artery disease Mother     died in late 71's w/ "hardening of the arteries"  . Heart disease Mother   . Heart  disease Son   . Diabetes Son   . Colon cancer Neg Hx     Social History History  Substance Use Topics  . Smoking status: Former Smoker -- 40 years    Types: Cigarettes    Quit date: 09/23/1988  . Smokeless tobacco: Never Used     Comment: quit 20+ years ago  . Alcohol Use: No    No Known Allergies  Current Outpatient Prescriptions  Medication Sig Dispense Refill  . alum & mag hydroxide-simeth (MAALOX/MYLANTA) 200-200-20 MG/5ML suspension Take 30 mLs by mouth every 6 (six) hours as needed. For indigestion      . aspirin 81 MG tablet Take 1 tablet (81 mg total) by mouth every other day.  30 tablet  1  . furosemide (LASIX) 20 MG tablet Take 20 mg by mouth daily.      Marland Kitchen glipiZIDE (GLUCOTROL) 10 MG tablet Take 10 mg by mouth daily.        Marland Kitchen levothyroxine (SYNTHROID, LEVOTHROID) 75 MCG tablet Take 75 mcg by mouth daily.      . Linaclotide (LINZESS) 145 MCG CAPS Take 145 mcg by mouth every other day.      Marland Kitchen  lisinopril (PRINIVIL,ZESTRIL) 10 MG tablet Take 10 mg by mouth daily.      . Multiple Vitamin (MULTIVITAMIN) tablet Take 1 tablet by mouth daily.        . nitroGLYCERIN (NITROSTAT) 0.4 MG SL tablet Place 0.4 mg under the tongue every 5 (five) minutes as needed. For chest pain      . potassium chloride (K-DUR,KLOR-CON) 10 MEQ tablet Take 10 mEq by mouth daily.      . Rivaroxaban (XARELTO) 15 MG TABS tablet Take 1 tablet (15 mg total) by mouth daily.  30 tablet  1  . rosuvastatin (CRESTOR) 10 MG tablet Take 10 mg by mouth daily.      . metoCLOPramide (REGLAN) 5 MG/5ML solution Take 5 mg by mouth 4 (four) times daily -  before meals and at bedtime. Prn only      . [DISCONTINUED] metoCLOPramide (REGLAN) 5 MG/5ML solution Take 5 mLs (5 mg total) by mouth 4 (four) times daily -  before meals and at bedtime.  90 mL  0  . [DISCONTINUED] rivastigmine (EXELON) 4.6 mg/24hr Place 1 patch onto the skin daily.        Review of Systems  A comprehensive review of systems was negative except for:  Constitutional: positive for fatigue Behavioral/Psych: positive for anxiety  Physical Exam  ZOX:WRUEA, healthy, no distress, well nourished and well developed SKIN: skin color, texture, turgor are normal, no rashes or significant lesions HEAD: Normocephalic, No masses, lesions, tenderness or abnormalities EYES: normal, PERRLA EARS: External ears normal OROPHARYNX:no exudate and no erythema  NECK: supple, no adenopathy LYMPH:  no palpable lymphadenopathy, no hepatosplenomegaly LUNGS: clear to auscultation  HEART: regular rate & rhythm, no murmurs and no gallops ABDOMEN:abdomen soft, non-tender, normal bowel sounds and no masses or organomegaly BACK: Back symmetric, no curvature. EXTREMITIES:no joint deformities, effusion, or inflammation, no edema, no skin discoloration  NEURO: alert & oriented x 3 with fluent speech, no focal motor/sensory deficits, gait normal  PERFORMANCE STATUS: ECOG 1  LABORATORY DATA: Lab Results  Component Value Date   WBC 10.8* 07/29/2012   HGB 12.8* 07/29/2012   HCT 38.5* 07/29/2012   MCV 84.1 07/29/2012   PLT 247 07/29/2012      Chemistry      Component Value Date/Time   NA 137 07/29/2012 1207   K 4.2 07/29/2012 1207   CL 103 07/29/2012 1207   CO2 23 07/29/2012 1207   BUN 20 07/29/2012 1207   CREATININE 1.23 07/29/2012 1207   CREATININE 1.48* 05/11/2012 1030      Component Value Date/Time   CALCIUM 9.3 07/29/2012 1207   ALKPHOS 117 07/29/2012 1207   AST 35 07/29/2012 1207   ALT 29 07/29/2012 1207   BILITOT 0.8 07/29/2012 1207       RADIOGRAPHIC STUDIES: Dg Chest 2 View Within Previous 72 Hours.  Films Obtained On Friday Are Acceptable For Monday And Tuesday Cases  08/04/2012  *RADIOLOGY REPORT*  Clinical Data: Endobronchial ultrasound preoperative film  CHEST - 2 VIEW  Comparison: 04/10/2012  Findings: Hyperinflation indicates COPD.  The patient is status post median sternotomy.  Heart size and vascular pattern are normal.  Lungs are clear except for a  stable calcified granuloma in the medial right upper lobe measuring about 7 mm.  IMPRESSION: No acute findings.   Original Report Authenticated By: Esperanza Heir, M.D.    Ct Abdomen Pelvis W Contrast  07/17/2012  *RADIOLOGY REPORT*  Clinical Data: Abdominal pain.  Recent cholecystectomy.  CT ABDOMEN AND PELVIS WITH  CONTRAST  Technique:  Multidetector CT imaging of the abdomen and pelvis was performed following the standard protocol during bolus administration of intravenous contrast.  Contrast: OMNIPAQUE IOHEXOL 300 MG/ML  SOLN  Comparison: PET CT 06/29/2012 and earlier studies  Findings: Bilateral coronary calcifications.  Stable semisolid nodule in the posterior right lower lobe. Vascular clips in the gallbladder fossa.  No new liver lesion.  Splenic granuloma. Normal adrenal glands.  Mild pancreatic parenchymal atrophy.  Focal atrophy in the lower pole left kidney.  No hydronephrosis.  Stomach and small bowel are nondilated.  Colon is nondilated with multiple sigmoid diverticula; no adjacent inflammatory/edematous change. Urinary bladder incompletely distended.  No ascites.  No free air. Portal vein patent.  Extensive aortoiliac arterial plaque.  No adenopathy.  Multilevel spondylitic changes in the lumbar spine. Previous prostatectomy.  IMPRESSION: 1.  No acute abdominal process. 2. Sigmoid diverticulosis. 3.  Atherosclerosis, including aortoiliac and coronary artery disease. Please note that although the presence of coronary artery calcium documents the presence of coronary artery disease, the severity of this disease and any potential stenosis cannot be assessed on this non-gated CT examination.  Assessment for potential risk factor modification, dietary therapy or pharmacologic therapy may be warranted, if clinically indicated.   Original Report Authenticated By: Thora Lance III, M.D.     ASSESSMENT: This is a very pleasant 78 years old white male recently diagnosed with limited stage  small cell lung cancer pending further staging workup.  PLAN: I have a lengthy discussion with the patient and his son today about his disease stage, prognosis and treatment options. I recommended for the patient treatment with systemic chemotherapy in the form of carboplatin for AUC of 5 on day 1 and etoposide 100 mg/M2 on days 1, 2 and 3 with Neulasta support on day 4. This would be concurrent with radiotherapy. The patient will be seen later today. Dr. Roselind Messier for evaluation of this option. I discussed with the patient the adverse effect of the chemotherapy including but not limited to alopecia, myelosuppression, nausea and vomiting, peripheral neuropathy, liver or renal dysfunction. I will complete the staging workup I ordered an MRI of the brain to rule out any brain metastasis. I expect the patient to start the first cycle of his systemic chemotherapy on 08/24/2012. He would have a chemotherapy education class before starting the first cycle of his chemotherapy. The patient would come back for followup visit in 3 weeks for evaluation and management any adverse effect of his chemotherapy. He was advised to call immediately if he has any concerning symptoms in the interval. I gave the patient and his son the time to ask questions and I answered them completely to their satisfaction. I would call his pharmacy was Compazine 10 mg by mouth every 6 hours as needed for nausea. All questions were answered. The patient knows to call the clinic with any problems, questions or concerns. We can certainly see the patient much sooner if necessary.  Thank you so much for allowing me to participate in the care of Shawn [name]. I will continue to follow up the patient with you and assist in his care.  I spent 30 minutes counseling the patient face to face. The total time spent in the appointment was 60 minutes.   Diamonte Stavely K. 08/13/2012, 4:18 PM

## 2012-08-14 ENCOUNTER — Encounter: Payer: Self-pay | Admitting: *Deleted

## 2012-08-14 NOTE — Progress Notes (Signed)
Spoke with pt at [facility] yesterday.  Educational/resource information given and explained to pt and son.  Distress and nutrition screening completed.  Questions and concerns addressed

## 2012-08-14 NOTE — Telephone Encounter (Signed)
I will forward as an FYI. Jennifer Castillo, CMA  

## 2012-08-16 MED ORDER — PROCHLORPERAZINE MALEATE 10 MG PO TABS: 10.0000 mg | ORAL_TABLET | Freq: Four times a day (QID) | ORAL | Status: DC | PRN

## 2012-08-16 NOTE — Patient Instructions (Signed)
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  We discussed treatment options including chemotherapy with carboplatin and etoposide concurrent with radiation.  First cycle expected on 08/24/2012.  Followup in 3 weeks.

## 2012-08-17 ENCOUNTER — Telehealth: Payer: Self-pay | Admitting: *Deleted

## 2012-08-17 ENCOUNTER — Telehealth: Payer: Self-pay | Admitting: Internal Medicine

## 2012-08-17 ENCOUNTER — Other Ambulatory Visit: Payer: Self-pay | Admitting: Radiation Oncology

## 2012-08-17 ENCOUNTER — Ambulatory Visit
Admission: RE | Admit: 2012-08-17 | Discharge: 2012-08-17 | Disposition: A | Discharge status: Home / Self Care | Payer: Medicare Other | Source: Ambulatory Visit | Attending: Radiation Oncology | Admitting: Radiation Oncology

## 2012-08-17 DIAGNOSIS — Z79899 Other long term (current) drug therapy: Secondary | ICD-10-CM | POA: Insufficient documentation

## 2012-08-17 DIAGNOSIS — R5383 Other fatigue: Secondary | ICD-10-CM | POA: Insufficient documentation

## 2012-08-17 DIAGNOSIS — R5381 Other malaise: Secondary | ICD-10-CM | POA: Insufficient documentation

## 2012-08-17 DIAGNOSIS — Z510 Encounter for antineoplastic radiation therapy: Secondary | ICD-10-CM | POA: Insufficient documentation

## 2012-08-17 DIAGNOSIS — J029 Acute pharyngitis, unspecified: Secondary | ICD-10-CM | POA: Insufficient documentation

## 2012-08-17 DIAGNOSIS — C3490 Malignant neoplasm of unspecified part of unspecified bronchus or lung: Secondary | ICD-10-CM

## 2012-08-17 DIAGNOSIS — K117 Disturbances of salivary secretion: Secondary | ICD-10-CM | POA: Insufficient documentation

## 2012-08-17 NOTE — Progress Notes (Signed)
  Radiation Oncology         [phone] ________________________________  Name: Shawn [name] MRN: [id number]  Date: 08/17/2012  DOB: Mar 05, 1928  SIMULATION AND TREATMENT PLANNING NOTE  DIAGNOSIS:  Limited stage small cell lung  NARRATIVE:  The patient was brought to the CT Simulation planning suite.  Identity was confirmed.  All relevant records and images related to the planned course of therapy were reviewed.  The patient freely provided informed written consent to proceed with treatment after reviewing the details related to the planned course of therapy. The consent form was witnessed and verified by the simulation staff.  Then, the patient was set-up in a stable reproducible  supine position for radiation therapy.  CT images were obtained.  Surface markings were placed.  The CT images were loaded into the planning software.  Then the target and avoidance structures were contoured.  Treatment planning then occurred.  The radiation prescription was entered and confirmed.  A total of 1 complex treatment devices were fabricated. I have requested : 3D Simulation I have requested dose volume histograms of the gtv,ptv esophagus spinal cord and lungs.  I have ordered:dose calc.  PLAN:  The patient will receive 59.4 Gy in 33 fractions.  ________________________________   Special treatment procedure note:  The patient will be receiving radiosensitizing chemotherapy. Given the increased potential for toxicities as well as the necessity for close monitoring of the patient and blood work, this constitutes a special treatment procedure   Billie Lade, PhD, MD

## 2012-08-17 NOTE — Telephone Encounter (Signed)
Per staff message and POF I have scheduled appts.  JMW  

## 2012-08-17 NOTE — Telephone Encounter (Signed)
Pt stopped by and was printed a copy of the sch.  Pt aware that cen/sch will call with mri appt .

## 2012-08-18 ENCOUNTER — Encounter: Payer: Self-pay | Admitting: *Deleted

## 2012-08-18 ENCOUNTER — Other Ambulatory Visit: Payer: Medicare Other

## 2012-08-24 ENCOUNTER — Ambulatory Visit (HOSPITAL_COMMUNITY)
Admission: RE | Admit: 2012-08-24 | Discharge: 2012-08-24 | Disposition: A | Discharge status: Home / Self Care | Payer: Medicare Other | Source: Ambulatory Visit | Attending: Internal Medicine | Admitting: Internal Medicine

## 2012-08-24 DIAGNOSIS — C3490 Malignant neoplasm of unspecified part of unspecified bronchus or lung: Secondary | ICD-10-CM | POA: Insufficient documentation

## 2012-08-24 MED ORDER — GADOBENATE DIMEGLUMINE 529 MG/ML IV SOLN
20.0000 mL | Freq: Once | INTRAVENOUS | Status: AC | PRN
  Administered 2012-08-24: 20 mL via INTRAVENOUS

## 2012-08-25 ENCOUNTER — Other Ambulatory Visit: Payer: Self-pay | Admitting: Internal Medicine

## 2012-08-25 ENCOUNTER — Ambulatory Visit (HOSPITAL_BASED_OUTPATIENT_CLINIC_OR_DEPARTMENT_OTHER): Payer: Medicare Other

## 2012-08-25 ENCOUNTER — Other Ambulatory Visit (HOSPITAL_BASED_OUTPATIENT_CLINIC_OR_DEPARTMENT_OTHER): Payer: Medicare Other | Admitting: Lab

## 2012-08-25 VITALS — BP 139/69 | HR 66 | Temp 97.0°F | Resp 18

## 2012-08-25 DIAGNOSIS — C3490 Malignant neoplasm of unspecified part of unspecified bronchus or lung: Secondary | ICD-10-CM

## 2012-08-25 DIAGNOSIS — Z5111 Encounter for antineoplastic chemotherapy: Secondary | ICD-10-CM

## 2012-08-25 DIAGNOSIS — C3400 Malignant neoplasm of unspecified main bronchus: Secondary | ICD-10-CM

## 2012-08-25 LAB — COMPREHENSIVE METABOLIC PANEL (CC13)
ALT: 17 U/L (ref 0–55)
AST: 17 U/L (ref 5–34)
Albumin: 3.2 g/dL — ABNORMAL LOW (ref 3.5–5.0)
CO2: 23 mEq/L (ref 22–29)
Calcium: 8.8 mg/dL (ref 8.4–10.4)
Chloride: 105 mEq/L (ref 98–107)
Creatinine: 1.4 mg/dL — ABNORMAL HIGH (ref 0.7–1.3)
Potassium: 4 mEq/L (ref 3.5–5.1)
Sodium: 138 mEq/L (ref 136–145)
Total Protein: 6.2 g/dL — ABNORMAL LOW (ref 6.4–8.3)

## 2012-08-25 LAB — CBC WITH DIFFERENTIAL/PLATELET
BASO%: 0.5 % (ref 0.0–2.0)
Eosinophils Absolute: 0.6 10*3/uL — ABNORMAL HIGH (ref 0.0–0.5)
LYMPH%: 29.2 % (ref 14.0–49.0)
MONO#: 0.8 10*3/uL (ref 0.1–0.9)
NEUT#: 5.3 10*3/uL (ref 1.5–6.5)
Platelets: 203 10*3/uL (ref 140–400)
RBC: 4.69 10*6/uL (ref 4.20–5.82)
WBC: 9.5 10*3/uL (ref 4.0–10.3)
lymph#: 2.8 10*3/uL (ref 0.9–3.3)
nRBC: 0 % (ref 0–0)

## 2012-08-25 MED ORDER — ONDANSETRON 16 MG/50ML IVPB (CHCC)
16.0000 mg | Freq: Once | INTRAVENOUS | Status: AC
  Administered 2012-08-25: 16 mg via INTRAVENOUS

## 2012-08-25 MED ORDER — SODIUM CHLORIDE 0.9 % IV SOLN
399.0000 mg | Freq: Once | INTRAVENOUS | Status: AC
  Administered 2012-08-25: 400 mg via INTRAVENOUS
  Filled 2012-08-25: qty 40

## 2012-08-25 MED ORDER — SODIUM CHLORIDE 0.9 % IV SOLN
Freq: Once | INTRAVENOUS | Status: AC
  Administered 2012-08-25: 10:00:00 via INTRAVENOUS

## 2012-08-25 MED ORDER — DEXAMETHASONE SODIUM PHOSPHATE 4 MG/ML IJ SOLN
20.0000 mg | Freq: Once | INTRAMUSCULAR | Status: AC
  Administered 2012-08-25: 20 mg via INTRAVENOUS

## 2012-08-25 MED ORDER — SODIUM CHLORIDE 0.9 % IV SOLN
100.0000 mg/m2 | Freq: Once | INTRAVENOUS | Status: AC
  Administered 2012-08-25: 220 mg via INTRAVENOUS
  Filled 2012-08-25: qty 11

## 2012-08-25 NOTE — Patient Instructions (Signed)
[facility] Discharge Instructions for Patients Receiving Chemotherapy  Today you received the following chemotherapy agents: etoposide, carboplatin  To help prevent nausea and vomiting after your treatment, we encourage you to take your nausea medication.   Take it as often as prescribed.     If you develop nausea and vomiting that is not controlled by your nausea medication, call the clinic. If it is after clinic hours your family physician or the after hours number for the clinic or go to the Emergency Department.   BELOW ARE SYMPTOMS THAT SHOULD BE REPORTED IMMEDIATELY:  *FEVER GREATER THAN 100.5 F  *CHILLS WITH OR WITHOUT FEVER  NAUSEA AND VOMITING THAT IS NOT CONTROLLED WITH YOUR NAUSEA MEDICATION  *UNUSUAL SHORTNESS OF BREATH  *UNUSUAL BRUISING OR BLEEDING  TENDERNESS IN MOUTH AND THROAT WITH OR WITHOUT PRESENCE OF ULCERS  *URINARY PROBLEMS  *BOWEL PROBLEMS  UNUSUAL RASH Items with * indicate a potential emergency and should be followed up as soon as possible.  One of the nurses will contact you 24 hours after your treatment. Please let the nurse know about any problems that you may have experienced. Feel free to call the clinic you have any questions or concerns. The clinic phone number is [phone].   I have been informed and understand all the instructions given to me. I know to contact the clinic, my physician, or go to the Emergency Department if any problems should occur. I do not have any questions at this time, but understand that I may call the clinic during office hours   should I have any questions or need assistance in obtaining follow up care.    __________________________________________  _____________  __________ Signature of Patient or Authorized Representative            Date                   Time    __________________________________________ Nurse's Signature     Carboplatin injection What is this medicine? CARBOPLATIN  (KAR boe pla tin) is a chemotherapy drug. It targets fast dividing cells, like cancer cells, and causes these cells to die. This medicine is used to treat ovarian cancer and many other cancers. This medicine may be used for other purposes; ask your health care provider or pharmacist if you have questions. What should I tell my health care provider before I take this medicine? They need to know if you have any of these conditions: -blood disorders -hearing problems -kidney disease -recent or ongoing radiation therapy -an unusual or allergic reaction to carboplatin, cisplatin, other chemotherapy, other medicines, foods, dyes, or preservatives -pregnant or trying to get pregnant -breast-feeding How should I use this medicine? This drug is usually given as an infusion into a vein. It is administered in a hospital or clinic by a specially trained health care professional. Talk to your pediatrician regarding the use of this medicine in children. Special care may be needed. Overdosage: If you think you have taken too much of this medicine contact a poison control center or emergency room at once. NOTE: This medicine is only for you. Do not share this medicine with others. What if I miss a dose? It is important not to miss a dose. Call your doctor or health care professional if you are unable to keep an appointment. What may interact with this medicine? -medicines for seizures -medicines to increase blood counts like filgrastim, pegfilgrastim, sargramostim -some antibiotics like amikacin, gentamicin, neomycin, streptomycin, tobramycin -vaccines Talk  to your doctor or health care professional before taking any of these medicines: -acetaminophen -aspirin -ibuprofen -ketoprofen -naproxen This list may not describe all possible interactions. Give your health care provider a list of all the medicines, herbs, non-prescription drugs, or dietary supplements you use. Also tell them if you smoke, drink  alcohol, or use illegal drugs. Some items may interact with your medicine. What should I watch for while using this medicine? Your condition will be monitored carefully while you are receiving this medicine. You will need important blood work done while you are taking this medicine. This drug may make you feel generally unwell. This is not uncommon, as chemotherapy can affect healthy cells as well as cancer cells. Report any side effects. Continue your course of treatment even though you feel ill unless your doctor tells you to stop. In some cases, you may be given additional medicines to help with side effects. Follow all directions for their use. Call your doctor or health care professional for advice if you get a fever, chills or sore throat, or other symptoms of a cold or flu. Do not treat yourself. This drug decreases your body's ability to fight infections. Try to avoid being around people who are sick. This medicine may increase your risk to bruise or bleed. Call your doctor or health care professional if you notice any unusual bleeding. Be careful brushing and flossing your teeth or using a toothpick because you may get an infection or bleed more easily. If you have any dental work done, tell your dentist you are receiving this medicine. Avoid taking products that contain aspirin, acetaminophen, ibuprofen, naproxen, or ketoprofen unless instructed by your doctor. These medicines may hide a fever. Do not become pregnant while taking this medicine. Women should inform their doctor if they wish to become pregnant or think they might be pregnant. There is a potential for serious side effects to an unborn child. Talk to your health care professional or pharmacist for more information. Do not breast-feed an infant while taking this medicine. What side effects may I notice from receiving this medicine? Side effects that you should report to your doctor or health care professional as soon as  possible: -allergic reactions like skin rash, itching or hives, swelling of the face, lips, or tongue -signs of infection - fever or chills, cough, sore throat, pain or difficulty passing urine -signs of decreased platelets or bleeding - bruising, pinpoint red spots on the skin, black, tarry stools, nosebleeds -signs of decreased red blood cells - unusually weak or tired, fainting spells, lightheadedness -breathing problems -changes in hearing -changes in vision -chest pain -high blood pressure -low blood counts - This drug may decrease the number of white blood cells, red blood cells and platelets. You may be at increased risk for infections and bleeding. -nausea and vomiting -pain, swelling, redness or irritation at the injection site -pain, tingling, numbness in the hands or feet -problems with balance, talking, walking -trouble passing urine or change in the amount of urine Side effects that usually do not require medical attention (report to your doctor or health care professional if they continue or are bothersome): -hair loss -loss of appetite -metallic taste in the mouth or changes in taste This list may not describe all possible side effects. Call your doctor for medical advice about side effects. You may report side effects to FDA at 1-800-FDA-1088. Where should I keep my medicine? This drug is given in a hospital or clinic and will not be stored  at home. NOTE: This sheet is a summary. It may not cover all possible information. If you have questions about this medicine, talk to your doctor, pharmacist, or health care provider.  2012, Elsevier/Gold Standard. (12/15/2007 2:38:05 PM)  Etoposide, VP-16 injection What is this medicine? ETOPOSIDE, VP-16 (e toe POE side) is a chemotherapy drug. It is used to treat testicular cancer, lung cancer, and other cancers. This medicine may be used for other purposes; ask your health care provider or pharmacist if you have questions. What  should I tell my health care provider before I take this medicine? They need to know if you have any of these conditions: -infection -kidney disease -low blood counts, like low white cell, platelet, or red cell counts -an unusual or allergic reaction to etoposide, other chemotherapeutic agents, other medicines, foods, dyes, or preservatives -pregnant or trying to get pregnant -breast-feeding How should I use this medicine? This medicine is for infusion into a vein. It is administered in a hospital or clinic by a specially trained health care professional. Talk to your pediatrician regarding the use of this medicine in children. Special care may be needed. Overdosage: If you think you have taken too much of this medicine contact a poison control center or emergency room at once. NOTE: This medicine is only for you. Do not share this medicine with others. What if I miss a dose? It is important not to miss your dose. Call your doctor or health care professional if you are unable to keep an appointment. What may interact with this medicine? -cyclosporine -medicines to increase blood counts like filgrastim, pegfilgrastim, sargramostim -vaccines This list may not describe all possible interactions. Give your health care provider a list of all the medicines, herbs, non-prescription drugs, or dietary supplements you use. Also tell them if you smoke, drink alcohol, or use illegal drugs. Some items may interact with your medicine. What should I watch for while using this medicine? Visit your doctor for checks on your progress. This drug may make you feel generally unwell. This is not uncommon, as chemotherapy can affect healthy cells as well as cancer cells. Report any side effects. Continue your course of treatment even though you feel ill unless your doctor tells you to stop. In some cases, you may be given additional medicines to help with side effects. Follow all directions for their use. Call your  doctor or health care professional for advice if you get a fever, chills or sore throat, or other symptoms of a cold or flu. Do not treat yourself. This drug decreases your body's ability to fight infections. Try to avoid being around people who are sick. This medicine may increase your risk to bruise or bleed. Call your doctor or health care professional if you notice any unusual bleeding. Be careful brushing and flossing your teeth or using a toothpick because you may get an infection or bleed more easily. If you have any dental work done, tell your dentist you are receiving this medicine. Avoid taking products that contain aspirin, acetaminophen, ibuprofen, naproxen, or ketoprofen unless instructed by your doctor. These medicines may hide a fever. Do not become pregnant while taking this medicine. Women should inform their doctor if they wish to become pregnant or think they might be pregnant. There is a potential for serious side effects to an unborn child. Talk to your health care professional or pharmacist for more information. Do not breast-feed an infant while taking this medicine. What side effects may I notice from receiving  this medicine? Side effects that you should report to your doctor or health care professional as soon as possible: -allergic reactions like skin rash, itching or hives, swelling of the face, lips, or tongue -low blood counts - this medicine may decrease the number of white blood cells, red blood cells and platelets. You may be at increased risk for infections and bleeding. -signs of infection - fever or chills, cough, sore throat, pain or difficulty passing urine -signs of decreased platelets or bleeding - bruising, pinpoint red spots on the skin, black, tarry stools, blood in the urine -signs of decreased red blood cells - unusually weak or tired, fainting spells, lightheadedness -breathing problems -changes in vision -mouth or throat sores or ulcers -pain, redness,  swelling or irritation at the injection site -pain, tingling, numbness in the hands or feet -redness, blistering, peeling or loosening of the skin, including inside the mouth -seizures -vomiting Side effects that usually do not require medical attention (report to your doctor or health care professional if they continue or are bothersome): -diarrhea -hair loss -loss of appetite -nausea -stomach pain This list may not describe all possible side effects. Call your doctor for medical advice about side effects. You may report side effects to FDA at 1-800-FDA-1088. Where should I keep my medicine? This drug is given in a hospital or clinic and will not be stored at home. NOTE: This sheet is a summary. It may not cover all possible information. If you have questions about this medicine, talk to your doctor, pharmacist, or health care provider.  2012, Elsevier/Gold Standard. (01/11/2008 5:24:12 PM)

## 2012-08-26 ENCOUNTER — Ambulatory Visit: Payer: Medicare Other | Admitting: Radiation Oncology

## 2012-08-26 ENCOUNTER — Ambulatory Visit
Admission: RE | Admit: 2012-08-26 | Discharge: 2012-08-26 | Disposition: A | Discharge status: Home / Self Care | Payer: Medicare Other | Source: Ambulatory Visit | Attending: Radiation Oncology | Admitting: Radiation Oncology

## 2012-08-26 ENCOUNTER — Ambulatory Visit (HOSPITAL_BASED_OUTPATIENT_CLINIC_OR_DEPARTMENT_OTHER): Payer: Medicare Other

## 2012-08-26 VITALS — BP 133/58 | HR 65 | Temp 97.8°F

## 2012-08-26 DIAGNOSIS — C3490 Malignant neoplasm of unspecified part of unspecified bronchus or lung: Secondary | ICD-10-CM

## 2012-08-26 DIAGNOSIS — C3400 Malignant neoplasm of unspecified main bronchus: Secondary | ICD-10-CM

## 2012-08-26 DIAGNOSIS — Z5111 Encounter for antineoplastic chemotherapy: Secondary | ICD-10-CM

## 2012-08-26 MED ORDER — SODIUM CHLORIDE 0.9 % IV SOLN
Freq: Once | INTRAVENOUS | Status: AC
  Administered 2012-08-26: 17:00:00 via INTRAVENOUS

## 2012-08-26 MED ORDER — ETOPOSIDE CHEMO INJECTION 1 GM/50ML
100.0000 mg/m2 | Freq: Once | INTRAVENOUS | Status: AC
  Administered 2012-08-26: 220 mg via INTRAVENOUS
  Filled 2012-08-26: qty 11

## 2012-08-26 MED ORDER — PROCHLORPERAZINE EDISYLATE 5 MG/ML IJ SOLN
10.0000 mg | Freq: Once | INTRAMUSCULAR | Status: AC
  Administered 2012-08-26: 10 mg via INTRAVENOUS

## 2012-08-26 NOTE — Patient Instructions (Signed)
[facility] Discharge Instructions for Patients Receiving Chemotherapy  Today you received the following chemotherapy agent: Etoposide  To help prevent nausea and vomiting after your treatment, we encourage you to take your nausea medication: Compazine 10 mg every 6 hours as needed for nausea Begin taking it at 1st sign of nausea and take it as often as prescribed.   If you develop nausea and vomiting that is not controlled by your nausea medication, call the clinic. If it is after clinic hours your family physician or the after hours number for the clinic or go to the Emergency Department.   BELOW ARE SYMPTOMS THAT SHOULD BE REPORTED IMMEDIATELY:  *FEVER GREATER THAN 100.5 F  *CHILLS WITH OR WITHOUT FEVER  NAUSEA AND VOMITING THAT IS NOT CONTROLLED WITH YOUR NAUSEA MEDICATION  *UNUSUAL SHORTNESS OF BREATH  *UNUSUAL BRUISING OR BLEEDING  TENDERNESS IN MOUTH AND THROAT WITH OR WITHOUT PRESENCE OF ULCERS  *URINARY PROBLEMS  *BOWEL PROBLEMS  UNUSUAL RASH Items with * indicate a potential emergency and should be followed up as soon as possible.   Feel free to call the clinic you have any questions or concerns. The clinic phone number is [phone].   I have been informed and understand all the instructions given to me. I know to contact the clinic, my physician, or go to the Emergency Department if any problems should occur. I do not have any questions at this time, but understand that I may call the clinic during office hours   should I have any questions or need assistance in obtaining follow up care.    __________________________________________  _____________  __________ Signature of Patient or Authorized Representative            Date                   Time    __________________________________________ Nurse's Signature    

## 2012-08-27 ENCOUNTER — Ambulatory Visit: Payer: Medicare Other

## 2012-08-27 ENCOUNTER — Ambulatory Visit (HOSPITAL_BASED_OUTPATIENT_CLINIC_OR_DEPARTMENT_OTHER): Payer: Medicare Other

## 2012-08-27 ENCOUNTER — Ambulatory Visit
Admission: RE | Admit: 2012-08-27 | Discharge: 2012-08-27 | Disposition: A | Discharge status: Home / Self Care | Payer: Medicare Other | Source: Ambulatory Visit | Attending: Radiation Oncology | Admitting: Radiation Oncology

## 2012-08-27 ENCOUNTER — Other Ambulatory Visit: Payer: Self-pay | Admitting: *Deleted

## 2012-08-27 VITALS — BP 164/74 | HR 61 | Temp 96.5°F

## 2012-08-27 DIAGNOSIS — C3400 Malignant neoplasm of unspecified main bronchus: Secondary | ICD-10-CM

## 2012-08-27 DIAGNOSIS — Z5111 Encounter for antineoplastic chemotherapy: Secondary | ICD-10-CM

## 2012-08-27 DIAGNOSIS — C3490 Malignant neoplasm of unspecified part of unspecified bronchus or lung: Secondary | ICD-10-CM

## 2012-08-27 MED ORDER — PROCHLORPERAZINE MALEATE 10 MG PO TABS
10.0000 mg | ORAL_TABLET | Freq: Once | ORAL | Status: AC
  Administered 2012-08-27: 10 mg via ORAL

## 2012-08-27 MED ORDER — SODIUM CHLORIDE 0.9 % IV SOLN
Freq: Once | INTRAVENOUS | Status: AC
  Administered 2012-08-27: 14:00:00 via INTRAVENOUS

## 2012-08-27 MED ORDER — SODIUM CHLORIDE 0.9 % IV SOLN
100.0000 mg/m2 | Freq: Once | INTRAVENOUS | Status: AC
  Administered 2012-08-27: 220 mg via INTRAVENOUS
  Filled 2012-08-27: qty 11

## 2012-08-27 NOTE — Progress Notes (Signed)
Pt has poor venous access, pt requesting port.  Per Dr Donnald Garre okay for pt to have port placement.  SLJ

## 2012-08-27 NOTE — Progress Notes (Signed)
Called to linac 2 to assess patient. Patient resting supine on couch. Patient alert and oriented to person, place, and time. No distress noted. Patient reports that he has not taken his nausea or pain medication yet today. Patient reports nausea associated with increased pain in back. Prior to this writer entering the room the patient reports that he turned his head and vomited a small amount but, "feels much better now." Assisted patient with cleaning up and dressing. Escorted patient out to lobby to his two sons. Provided patient's son with updated ca lander. Then, escorted patient and his two sons up to chemotherapy. Reported episode to chemo nurse, Baird Lyons, RN.

## 2012-08-27 NOTE — Patient Instructions (Signed)
[facility] Discharge Instructions for Patients Receiving Chemotherapy  Today you received the following chemotherapy agents Etoposide  To help prevent nausea and vomiting after your treatment, we encourage you to take your nausea medication tonight at 8pm tonight as directed.   If you develop nausea and vomiting that is not controlled by your nausea medication, call the clinic. If it is after clinic hours your family physician or the after hours number for the clinic or go to the Emergency Department.   BELOW ARE SYMPTOMS THAT SHOULD BE REPORTED IMMEDIATELY:  *FEVER GREATER THAN 100.5 F  *CHILLS WITH OR WITHOUT FEVER  NAUSEA AND VOMITING THAT IS NOT CONTROLLED WITH YOUR NAUSEA MEDICATION  *UNUSUAL SHORTNESS OF BREATH  *UNUSUAL BRUISING OR BLEEDING  TENDERNESS IN MOUTH AND THROAT WITH OR WITHOUT PRESENCE OF ULCERS  *URINARY PROBLEMS  *BOWEL PROBLEMS  UNUSUAL RASH Items with * indicate a potential emergency and should be followed up as soon as possible.  Feel free to call the clinic you have any questions or concerns. The clinic phone number is [phone].   I have been informed and understand all the instructions given to me. I know to contact the clinic, my physician, or go to the Emergency Department if any problems should occur. I do not have any questions at this time, but understand that I may call the clinic during office hours   should I have any questions or need assistance in obtaining follow up care.

## 2012-08-28 ENCOUNTER — Ambulatory Visit
Admission: RE | Admit: 2012-08-28 | Discharge: 2012-08-28 | Disposition: A | Discharge status: Home / Self Care | Payer: Medicare Other | Source: Ambulatory Visit | Attending: Radiation Oncology | Admitting: Radiation Oncology

## 2012-08-28 ENCOUNTER — Telehealth: Payer: Self-pay | Admitting: Internal Medicine

## 2012-08-28 ENCOUNTER — Ambulatory Visit: Payer: Medicare Other

## 2012-08-28 ENCOUNTER — Other Ambulatory Visit: Payer: Self-pay | Admitting: *Deleted

## 2012-08-28 ENCOUNTER — Ambulatory Visit (HOSPITAL_BASED_OUTPATIENT_CLINIC_OR_DEPARTMENT_OTHER): Payer: Medicare Other

## 2012-08-28 ENCOUNTER — Telehealth: Payer: Self-pay | Admitting: *Deleted

## 2012-08-28 VITALS — BP 159/73 | HR 74 | Temp 97.2°F

## 2012-08-28 DIAGNOSIS — C3490 Malignant neoplasm of unspecified part of unspecified bronchus or lung: Secondary | ICD-10-CM

## 2012-08-28 DIAGNOSIS — C3400 Malignant neoplasm of unspecified main bronchus: Secondary | ICD-10-CM

## 2012-08-28 MED ORDER — PEGFILGRASTIM INJECTION 6 MG/0.6ML
6.0000 mg | Freq: Once | SUBCUTANEOUS | Status: AC
  Administered 2012-08-28: 6 mg via SUBCUTANEOUS
  Filled 2012-08-28: qty 0.6

## 2012-08-28 MED ORDER — BIAFINE EX EMUL
CUTANEOUS | Status: DC | PRN
  Administered 2012-08-28: 1 via TOPICAL

## 2012-08-28 MED ORDER — LIDOCAINE-PRILOCAINE 2.5-2.5 % EX CREA: TOPICAL_CREAM | CUTANEOUS | Status: DC | PRN

## 2012-08-28 NOTE — Telephone Encounter (Signed)
Closing 12/5 pof as ir will call the pt with the port placement

## 2012-08-28 NOTE — Telephone Encounter (Signed)
Patient and son here for Servando's Neulasta injection following 1st carbo/vespid chemo treatment.  States he is better now.  Was having some nausea which was relieved with antiemetics.  Is drinking fluids.  Not having any vomiting or diarrhea.  Pt and family know to call if he has any problems, concerns, or questions. Discussed with patient and son about the port and placement of EMLA cream prior to chemo treatment.  Uses [facility] pharmacy on [address] where we will call in the Rx for the cream. All questions answered.

## 2012-08-28 NOTE — Progress Notes (Signed)
Patient education given to patient and son Shawn [name].Reviewed routine of clinic, doctor day generally every Tuesday but may be different according to clinic changes. Given Radiation Therapy and You Booklet.Reviewed side effects and how to take care of self to include shortness of breath, cough, skin changes, application of biafine twice daily when skin changes arise, esophagitis and fatigue.

## 2012-08-31 ENCOUNTER — Ambulatory Visit: Payer: Medicare Other

## 2012-08-31 ENCOUNTER — Ambulatory Visit
Admission: RE | Admit: 2012-08-31 | Discharge: 2012-08-31 | Disposition: A | Discharge status: Home / Self Care | Payer: Medicare Other | Source: Ambulatory Visit | Attending: Radiation Oncology | Admitting: Radiation Oncology

## 2012-09-01 ENCOUNTER — Ambulatory Visit (HOSPITAL_BASED_OUTPATIENT_CLINIC_OR_DEPARTMENT_OTHER): Payer: Medicare Other | Admitting: Physician Assistant

## 2012-09-01 ENCOUNTER — Ambulatory Visit: Payer: Medicare Other

## 2012-09-01 ENCOUNTER — Other Ambulatory Visit (HOSPITAL_BASED_OUTPATIENT_CLINIC_OR_DEPARTMENT_OTHER): Payer: Medicare Other | Admitting: Lab

## 2012-09-01 ENCOUNTER — Ambulatory Visit
Admission: RE | Admit: 2012-09-01 | Discharge: 2012-09-01 | Disposition: A | Discharge status: Home / Self Care | Payer: Medicare Other | Source: Ambulatory Visit | Attending: Radiation Oncology | Admitting: Radiation Oncology

## 2012-09-01 ENCOUNTER — Telehealth: Payer: Self-pay | Admitting: Internal Medicine

## 2012-09-01 ENCOUNTER — Encounter: Payer: Self-pay | Admitting: Physician Assistant

## 2012-09-01 ENCOUNTER — Telehealth: Payer: Self-pay | Admitting: *Deleted

## 2012-09-01 VITALS — BP 139/71 | HR 84 | Temp 96.9°F | Resp 18 | Ht 71.0 in | Wt 204.1 lb

## 2012-09-01 DIAGNOSIS — C3400 Malignant neoplasm of unspecified main bronchus: Secondary | ICD-10-CM

## 2012-09-01 DIAGNOSIS — C3490 Malignant neoplasm of unspecified part of unspecified bronchus or lung: Secondary | ICD-10-CM

## 2012-09-01 LAB — COMPREHENSIVE METABOLIC PANEL (CC13)
Alkaline Phosphatase: 161 U/L — ABNORMAL HIGH (ref 40–150)
Glucose: 157 mg/dl — ABNORMAL HIGH (ref 70–99)
Sodium: 138 mEq/L (ref 136–145)
Total Bilirubin: 1.29 mg/dL — ABNORMAL HIGH (ref 0.20–1.20)
Total Protein: 6.2 g/dL — ABNORMAL LOW (ref 6.4–8.3)

## 2012-09-01 LAB — CBC WITH DIFFERENTIAL/PLATELET
BASO%: 1 % (ref 0.0–2.0)
EOS%: 2.9 % (ref 0.0–7.0)
Eosinophils Absolute: 0.1 10*3/uL (ref 0.0–0.5)
LYMPH%: 32.4 % (ref 14.0–49.0)
MCH: 28.7 pg (ref 27.2–33.4)
MCHC: 33.5 g/dL (ref 32.0–36.0)
MCV: 85.9 fL (ref 79.3–98.0)
MONO%: 1.1 % (ref 0.0–14.0)
Platelets: 146 10*3/uL (ref 140–400)
RBC: 4.39 10*6/uL (ref 4.20–5.82)

## 2012-09-01 NOTE — Patient Instructions (Addendum)
Continue with weekly labs  Follow up in 2 weeks prior to your next cycle of chemotherapy

## 2012-09-01 NOTE — Telephone Encounter (Signed)
gv and printed appt schedule for pt for Dec...the patient aware...emailed michelle to add chemo.Marland KitchenMarland Kitchen

## 2012-09-01 NOTE — Telephone Encounter (Signed)
Per staff message and POF I have scheduled appts.  JMW  

## 2012-09-02 ENCOUNTER — Ambulatory Visit
Admission: RE | Admit: 2012-09-02 | Discharge: 2012-09-02 | Disposition: A | Discharge status: Home / Self Care | Payer: Medicare Other | Source: Ambulatory Visit | Attending: Radiation Oncology | Admitting: Radiation Oncology

## 2012-09-02 ENCOUNTER — Ambulatory Visit: Payer: Medicare Other

## 2012-09-02 VITALS — BP 131/67 | HR 78 | Resp 20 | Wt 195.4 lb

## 2012-09-02 DIAGNOSIS — C3490 Malignant neoplasm of unspecified part of unspecified bronchus or lung: Secondary | ICD-10-CM

## 2012-09-02 NOTE — Progress Notes (Signed)
Weekly Management Note Current Dose:9 Gy  Projected Dose:59.4 Gy   Narrative:  The patient presents for routine under treatment assessment.  CBCT/MVCT images/Port film x-rays were reviewed.  The chart was checked. Doing well. No complaints except fatigue.   Physical Findings:  Unchanged  Vitals:  Filed Vitals:   09/02/12 1348  BP: 131/67  Pulse: 78  Resp: 20   Weight:  Wt Readings from Last 3 Encounters:  09/02/12 195 lb 6.4 oz (88.633 kg)  09/01/12 204 lb 1.6 oz (92.579 kg)  08/13/12 210 lb (95.255 kg)   Lab Results  Component Value Date   WBC 4.3 09/01/2012   HGB 12.6* 09/01/2012   HCT 37.7* 09/01/2012   MCV 85.9 09/01/2012   PLT 146 09/01/2012   Lab Results  Component Value Date   CREATININE 1.4* 09/01/2012   BUN 36.0* 09/01/2012   NA 138 09/01/2012   K 4.3 09/01/2012   CL 108* 09/01/2012   CO2 22 09/01/2012     Impression:  The patient is tolerating radiation.  Plan:  Continue treatment as planned. Dietician will be set up for counseling.

## 2012-09-02 NOTE — Progress Notes (Addendum)
Pt has dry cough, denies SOB. He does have some fatigue,  loss of appetite, nutrition appt will be scheduled today.  First chemo last week, receives chemo q 3 weeks.

## 2012-09-03 ENCOUNTER — Ambulatory Visit: Payer: Medicare Other

## 2012-09-03 ENCOUNTER — Ambulatory Visit
Admission: RE | Admit: 2012-09-03 | Discharge: 2012-09-03 | Disposition: A | Discharge status: Home / Self Care | Payer: Medicare Other | Source: Ambulatory Visit | Attending: Radiation Oncology | Admitting: Radiation Oncology

## 2012-09-04 ENCOUNTER — Ambulatory Visit
Admission: RE | Admit: 2012-09-04 | Discharge: 2012-09-04 | Disposition: A | Discharge status: Home / Self Care | Payer: Medicare Other | Source: Ambulatory Visit | Attending: Radiation Oncology | Admitting: Radiation Oncology

## 2012-09-04 ENCOUNTER — Ambulatory Visit: Payer: Medicare Other

## 2012-09-04 NOTE — Progress Notes (Signed)
No images are attached to the encounter. No scans are attached to the encounter. No scans are attached to the encounter.  [facility]cer Center OFFICE PROGRESS NOTE  Shawn Meo, Shawn [name] [address] [facility], [place]. [place] [address]  DIAGNOSIS: Limited stage small cell lung cancer pending further staging workup  PRIOR THERAPY: None  CURRENT THERAPY: Systemic chemotherapy with carboplatin for an AUC of 5 given on day 1 and etoposide at 100 mg per meter squared given on days 1, 2 and 3 with Neulasta support given on day 4 status post 1 cycle. This will be given concurrent with radiotherapy under the care of Dr. Roselind Messier  INTERVAL HISTORY: Shawn [name] 78 y.o. male returns for a scheduled regular office visit for followup of his small cell lung cancer. He is now status post one cycle of systemic chemotherapy with carboplatin and etoposide with Neulasta support concurrent with radiation. Patient states that he tolerated his chemotherapy without difficulty. He had no issues with nausea, vomiting, diarrhea or constipation. He denied any fever or chills, shortness of breath, hemoptysis or cough.   MEDICAL HISTORY: Past Medical History  Diagnosis Date  . CAD (coronary artery disease)     a. 1990 CABGx3: LIMA->LAD, VG->OM, VG->dRCA;  b. 05/2007 VF Arrest/Cath/PCI: 100 VG->OM tx w/ BMS, 95 VG->RCA tx w/ bms;  c. 06/2007 90 Native PDA tx w DES ;  d. 05/2009 Echo EF 60-65%, nl WM, mild MR; e. normal lexiscan myoview 03/30/12  . HTN (hypertension)   . Hypercholesteremia   . Atrial fibrillation     a. had been on tikosyn -> d/c 09/2010;  b. Was on Amio ->d/c 09/2011;  c.  anticoagulated w/ Pradaxa  . Junctional ectopic tachycardia     a. noted 09/2010  . Sinus bradycardia   . Back pain     a. Followed by [facility] - on prednisone  . Prostate cancer   . Unsteady gait     a. in rehab for core strengthening  . Colitis   . Tubular adenoma of colon 02/2007  . Myocardial infarction   .  Arthritis   . CHF (congestive heart failure)   . Depression   . Internal hemorrhoids without mention of complication   . Diverticulosis of colon (without mention of hemorrhage)   . Diabetes mellitus     Type 2 NIDDM x 15 years  . CKD (chronic kidney disease), stage III     pt denies CKD  . GERD (gastroesophageal reflux disease)     ALLERGIES:   has no known allergies.  MEDICATIONS:  Current Outpatient Prescriptions  Medication Sig Dispense Refill  . alum & mag hydroxide-simeth (MAALOX/MYLANTA) 200-200-20 MG/5ML suspension Take 30 mLs by mouth every 6 (six) hours as needed. For indigestion      . aspirin 81 MG tablet Take 1 tablet (81 mg total) by mouth every other day.  30 tablet  1  . furosemide (LASIX) 20 MG tablet Take 20 mg by mouth daily.      Marland Kitchen glipiZIDE (GLUCOTROL) 10 MG tablet Take 10 mg by mouth daily.        Marland Kitchen levothyroxine (SYNTHROID, LEVOTHROID) 75 MCG tablet Take 75 mcg by mouth daily.      Marland Kitchen lidocaine-prilocaine (EMLA) cream Apply topically as needed. Apply to port 1 hour before chemotherapy, cover with plastic dressing.  30 g  0  . Linaclotide (LINZESS) 145 MCG CAPS Take 145 mcg by mouth every other day.      Marland Kitchen  lisinopril (PRINIVIL,ZESTRIL) 10 MG tablet Take 10 mg by mouth daily.      . Multiple Vitamin (MULTIVITAMIN) tablet Take 1 tablet by mouth daily.        . nitroGLYCERIN (NITROSTAT) 0.4 MG SL tablet Place 0.4 mg under the tongue every 5 (five) minutes as needed. For chest pain      . potassium chloride (K-DUR,KLOR-CON) 10 MEQ tablet Take 10 mEq by mouth daily.      . prochlorperazine (COMPAZINE) 10 MG tablet Take 10 mg by mouth every 6 (six) hours as needed.      . Rivaroxaban (XARELTO) 15 MG TABS tablet Take 1 tablet (15 mg total) by mouth daily.  30 tablet  1  . rosuvastatin (CRESTOR) 10 MG tablet Take 10 mg by mouth daily.      . [DISCONTINUED] rivastigmine (EXELON) 4.6 mg/24hr Place 1 patch onto the skin daily.        SURGICAL HISTORY:  Past Surgical  History  Procedure Date  . Coronary artery bypass graft 1990  . Prostate surgery 01/1992    retropubic prostatectomy  . Cardiac valve surgery   . Coronary angioplasty with stent placement 2008    x 3   . Rt finger trigger a-1 pulley   . Cholecystectomy 05/05/12  . Cholecystectomy 05/05/2012    Procedure: LAPAROSCOPIC CHOLECYSTECTOMY WITH INTRAOPERATIVE CHOLANGIOGRAM;  Surgeon: Mariella Saa, Shawn [name];  Location: [facility] OR;  Service: General;  Laterality: N/A;  . Endobronchial ultrasound 08/04/2012    Procedure: ENDOBRONCHIAL ULTRASOUND;  Surgeon: Kalman Shan, Shawn [name];  Location: [facility] OR;  Service: Cardiopulmonary;  Laterality: N/A;  . Video bronchoscopy 08/04/2012    Procedure: VIDEO BRONCHOSCOPY WITH FLUORO;  Surgeon: Kalman Shan, Shawn [name];  Location: [facility] OR;  Service: Cardiopulmonary;  Laterality: N/A;    REVIEW OF SYSTEMS:  A comprehensive review of systems was negative.   PHYSICAL EXAMINATION: General appearance: alert, cooperative, appears stated age and no distress Head: Normocephalic, without obvious abnormality, atraumatic Neck: no adenopathy, no carotid bruit, no JVD, supple, symmetrical, trachea midline and thyroid not enlarged, symmetric, no tenderness/mass/nodules Lymph nodes: Cervical, supraclavicular, and axillary nodes normal. Resp: clear to auscultation bilaterally Cardio: regular rate and rhythm, S1, S2 normal, no murmur, click, rub or gallop GI: soft, non-tender; bowel sounds normal; no masses,  no organomegaly Extremities: extremities normal, atraumatic, no cyanosis or edema Neurologic: Alert and oriented X 3, normal strength and tone. Normal symmetric reflexes. Normal coordination and gait  ECOG PERFORMANCE STATUS: 0 - Asymptomatic  Blood pressure 139/71, pulse 84, temperature 96.9 F (36.1 C), temperature source Oral, resp. rate 18, height 5\' 11"  (1.803 m), weight 204 lb 1.6 oz (92.579 kg).  LABORATORY DATA: Lab Results  Component Value Date   WBC 4.3 09/01/2012    HGB 12.6* 09/01/2012   HCT 37.7* 09/01/2012   MCV 85.9 09/01/2012   PLT 146 09/01/2012      Chemistry      Component Value Date/Time   NA 138 09/01/2012 1128   NA 137 07/29/2012 1207   K 4.3 09/01/2012 1128   K 4.2 07/29/2012 1207   CL 108* 09/01/2012 1128   CL 103 07/29/2012 1207   CO2 22 09/01/2012 1128   CO2 23 07/29/2012 1207   BUN 36.0* 09/01/2012 1128   BUN 20 07/29/2012 1207   CREATININE 1.4* 09/01/2012 1128   CREATININE 1.23 07/29/2012 1207   CREATININE 1.48* 05/11/2012 1030      Component Value Date/Time   CALCIUM 8.7 09/01/2012 1128   CALCIUM 9.3 07/29/2012  1207   ALKPHOS 161* 09/01/2012 1128   ALKPHOS 117 07/29/2012 1207   AST 20 09/01/2012 1128   AST 35 07/29/2012 1207   ALT 17 09/01/2012 1128   ALT 29 07/29/2012 1207   BILITOT 1.29* 09/01/2012 1128   BILITOT 0.8 07/29/2012 1207       RADIOGRAPHIC STUDIES:  Mr Laqueta Jean ZO Contrast  08/25/2012  *RADIOLOGY REPORT*  Clinical Data: Recent diagnosis of lung cancer.  Denies neurological symptoms.  MRI HEAD WITHOUT AND WITH CONTRAST  Technique:  Multiplanar, multiecho pulse sequences of the brain and surrounding structures were obtained according to standard protocol without and with intravenous contrast  Contrast: 20mL MULTIHANCE GADOBENATE DIMEGLUMINE 529 MG/ML IV SOLN  Comparison: 07/09/2011.  Findings: Left temporal - occipital lobe junction  sub centimeter area of enhancement which appears slightly nodular on the axial imaging (series 12 image 23) and with a slightly gyriform appearance on coronal imaging (series 13 image 8).  This may represent a small metastatic focus although a small area of enhancement related to a subacute infarct is not entirely excluded as cause of this appearance.  Tiny area of enhancement the medial right frontal lobe (series 13 and 17) has an appearance of a vessel on axial with sagittal sequence.  Tiny areas of enhancement right occipital lobe (series 14 images 10 and 11) may represent small  vessels/pulsation artifact rather than areas of enhancement.  Attention to this on follow-up.  No acute thrombotic infarct.  Remote small infarcts cerebellum bilaterally.  Remote left thalamic infarct.  Marked confluent white matter type changes periventricular subcortical region.  Global atrophy without hydrocephalus.  Major intracranial vascular structures are patent.  Opacification/mucosal thickening paranasal sinuses most notable involving the right sphenoid sinus air cell.  Transverse ligament hypertrophy.  Scattered tiny blood breakdown products probably related to result of remote hemorrhagic ischemia.  IMPRESSION: Question small metastatic focus posterior left temporal - occipital lobe junction as detailed above.  Remote infarcts and prominent small vessel disease type changes.  Global atrophy.  Paranasal sinus mucosal thickening / opacification most notable right sphenoid sinus.   Original Report Authenticated By: Lacy Duverney, M.D.      ASSESSMENT/PLAN: Patient is a pleasant 78 year old white male recently diagnosed with limited stage small cell lung cancer pending further staging workup. He did have an MRI of the brain with and without contrast performed on 08/24/2012. There was a questionable small metastatic focus in the posterior left temporal-occipital lobe junction. The patient was discussed with Dr. Arbutus Ped. This is been brought to Dr. Trina Ao attention for further review and should it be deemed metastatic, treatment. If this does represent a metastatic focus his diagnosis within the extensive stage as opposed to limited stage small cell lung cancer. He'll continue with weekly labs and return in 2 weeks prior to cycle #2 with a repeat CBC differential and C. met.     Laural Benes, Obaloluwa Delatte E, PA-C     All questions were answered. The patient knows to call the clinic with any problems, questions or concerns. We can certainly see the patient much sooner if necessary.  I spent 20 minutes  counseling the patient face to face. The total time spent in the appointment was 30 minutes.

## 2012-09-07 ENCOUNTER — Telehealth: Payer: Self-pay | Admitting: *Deleted

## 2012-09-07 ENCOUNTER — Ambulatory Visit: Payer: Medicare Other

## 2012-09-07 ENCOUNTER — Other Ambulatory Visit: Payer: Self-pay | Admitting: Radiology

## 2012-09-07 ENCOUNTER — Ambulatory Visit
Admission: RE | Admit: 2012-09-07 | Discharge: 2012-09-07 | Disposition: A | Discharge status: Home / Self Care | Payer: Medicare Other | Source: Ambulatory Visit | Attending: Radiation Oncology | Admitting: Radiation Oncology

## 2012-09-07 NOTE — Telephone Encounter (Signed)
Shawn [name] called wanting to know whether pt can interact with family over Christmas.  He is getting chemo the 24th, 26th, and 27th.  Informed Shawn [name] that he will get labwork done on 12/24 and at that point we will know whether his WBC is at a safe level for him to be able to interact with family.  If there is anyone who is sick they should avoid being with pt, other then that practicing good handwashing should be sufficient and he can enjoy some time with his family.  SLJ

## 2012-09-08 ENCOUNTER — Ambulatory Visit
Admission: RE | Admit: 2012-09-08 | Discharge: 2012-09-08 | Disposition: A | Discharge status: Home / Self Care | Payer: Medicare Other | Source: Ambulatory Visit | Attending: Radiation Oncology | Admitting: Radiation Oncology

## 2012-09-08 ENCOUNTER — Ambulatory Visit: Payer: Medicare Other

## 2012-09-08 ENCOUNTER — Telehealth: Payer: Self-pay | Admitting: Internal Medicine

## 2012-09-08 ENCOUNTER — Encounter: Payer: Self-pay | Admitting: Radiation Oncology

## 2012-09-08 ENCOUNTER — Other Ambulatory Visit (HOSPITAL_BASED_OUTPATIENT_CLINIC_OR_DEPARTMENT_OTHER): Payer: Medicare Other

## 2012-09-08 VITALS — BP 121/72 | HR 78 | Resp 18 | Wt 201.2 lb

## 2012-09-08 DIAGNOSIS — C3490 Malignant neoplasm of unspecified part of unspecified bronchus or lung: Secondary | ICD-10-CM

## 2012-09-08 DIAGNOSIS — C3400 Malignant neoplasm of unspecified main bronchus: Secondary | ICD-10-CM

## 2012-09-08 LAB — CBC WITH DIFFERENTIAL/PLATELET
BASO%: 0.5 % (ref 0.0–2.0)
EOS%: 0.5 % (ref 0.0–7.0)
HCT: 35.4 % — ABNORMAL LOW (ref 38.4–49.9)
LYMPH%: 15.2 % (ref 14.0–49.0)
MCH: 28.4 pg (ref 27.2–33.4)
MCHC: 33.4 g/dL (ref 32.0–36.0)
MONO#: 0.8 10*3/uL (ref 0.1–0.9)
NEUT%: 78.5 % — ABNORMAL HIGH (ref 39.0–75.0)
Platelets: 72 10*3/uL — ABNORMAL LOW (ref 140–400)
RBC: 4.18 10*6/uL — ABNORMAL LOW (ref 4.20–5.82)
WBC: 14.3 10*3/uL — ABNORMAL HIGH (ref 4.0–10.3)

## 2012-09-08 LAB — COMPREHENSIVE METABOLIC PANEL (CC13)
ALT: 17 U/L (ref 0–55)
AST: 19 U/L (ref 5–34)
Albumin: 3.2 g/dL — ABNORMAL LOW (ref 3.5–5.0)
Alkaline Phosphatase: 135 U/L (ref 40–150)
BUN: 18 mg/dL (ref 7.0–26.0)
CO2: 24 meq/L (ref 22–29)
Calcium: 8.9 mg/dL (ref 8.4–10.4)
Chloride: 107 meq/L (ref 98–107)
Creatinine: 1.4 mg/dL — ABNORMAL HIGH (ref 0.7–1.3)
Glucose: 95 mg/dL (ref 70–99)
Potassium: 4.1 meq/L (ref 3.5–5.1)
Sodium: 141 meq/L (ref 136–145)
Total Bilirubin: 0.36 mg/dL (ref 0.20–1.20)
Total Protein: 6.1 g/dL — ABNORMAL LOW (ref 6.4–8.3)

## 2012-09-08 NOTE — Progress Notes (Signed)
[facility]    Radiation Oncology [address] [place]     Maryln Gottron, M.D. [place], [place] [address]               Billie Lade, M.D., Ph.D. Phone: [phone]      Molli Hazard A. Kathrynn Running, M.D. Fax: [phone]      Radene Gunning, M.D., Ph.D.         Lurline Hare, M.D.         Grayland Jack, M.D Weekly Treatment Management Note  Name: Shawn [name]     MRN: [id number]        CSN: [id number] Date: 09/08/2012      DOB: 06-Nov-1927  CC: Shawn Meo, MD         Shawn [name]    Status: Outpatient  Diagnosis: The encounter diagnosis was Poorly differentiated lung carcinoma, favoring extensive stage small cell lung cancer - Nov 2013, ECOG 1.  Current Dose: 16.2 Gy  Current Fraction: 9  Planned Dose: 59.4 Gy  Narrative: Shawn [name] was seen today for weekly treatment management. The chart was checked and CBCT  were reviewed. He is having some fatigue. He to also complains of dry mouth which is been a chronic problem for him he denies any swallowing difficulties or pain with swallowing. The patient denies any breathing problems. Antiemetics have soft his problems with nausea.  He denies any headaches or visual problems.  I did review the patient's MRI the brain. There  is question of very early brain metastasis. I would recommend we hold off on treating the brain and repeat MRI in one to 2 months.  Review of patient's allergies indicates no known allergies.  Current Outpatient Prescriptions  Medication Sig Dispense Refill  . alum & mag hydroxide-simeth (MAALOX/MYLANTA) 200-200-20 MG/5ML suspension Take 30 mLs by mouth every 6 (six) hours as needed. For indigestion      . aspirin 81 MG tablet Take 1 tablet (81 mg total) by mouth every other day.  30 tablet  1  . furosemide (LASIX) 20 MG tablet Take 20 mg by mouth daily.      Marland Kitchen glipiZIDE (GLUCOTROL) 10 MG tablet Take 10 mg by mouth daily.        Marland Kitchen levothyroxine (SYNTHROID, LEVOTHROID) 75 MCG tablet Take 75 mcg by  mouth daily.      Marland Kitchen lidocaine-prilocaine (EMLA) cream Apply topically as needed. Apply to port 1 hour before chemotherapy, cover with plastic dressing.  30 g  0  . Linaclotide (LINZESS) 145 MCG CAPS Take 145 mcg by mouth every other day.      . lisinopril (PRINIVIL,ZESTRIL) 10 MG tablet Take 10 mg by mouth daily.      . Multiple Vitamin (MULTIVITAMIN) tablet Take 1 tablet by mouth daily.        . nitroGLYCERIN (NITROSTAT) 0.4 MG SL tablet Place 0.4 mg under the tongue every 5 (five) minutes as needed. For chest pain      . potassium chloride (K-DUR,KLOR-CON) 10 MEQ tablet Take 10 mEq by mouth daily.      . prochlorperazine (COMPAZINE) 10 MG tablet Take 10 mg by mouth every 6 (six) hours as needed.      . Rivaroxaban (XARELTO) 15 MG TABS tablet Take 1 tablet (15 mg total) by mouth daily.  30 tablet  1  . rosuvastatin (CRESTOR) 10 MG tablet Take 10 mg by mouth daily.      . [DISCONTINUED] rivastigmine (EXELON) 4.6 mg/24hr Place 1  patch onto the skin daily.       Labs:  Lab Results  Component Value Date   WBC 14.3* 09/08/2012   HGB 11.8* 09/08/2012   HCT 35.4* 09/08/2012   MCV 84.8 09/08/2012   PLT 72 Platelet count confirmed by slide estimate* 09/08/2012   Lab Results  Component Value Date   CREATININE 1.4* 09/08/2012   BUN 18.0 09/08/2012   NA 141 09/08/2012   K 4.1 09/08/2012   CL 107 09/08/2012   CO2 24 09/08/2012   Lab Results  Component Value Date   ALT 17 09/08/2012   AST 19 09/08/2012   PHOS 4.1 05/29/2009   BILITOT 0.36 09/08/2012    Physical Examination:  weight is 201 lb 3.2 oz (91.264 kg). His blood pressure is 121/72 and his pulse is 78. His respiration is 18.    Wt Readings from Last 3 Encounters:  09/08/12 201 lb 3.2 oz (91.264 kg)  09/02/12 195 lb 6.4 oz (88.633 kg)  09/01/12 204 lb 1.6 oz (92.579 kg)     Lungs - Normal respiratory effort, chest expands symmetrically. Lungs are clear to auscultation, no crackles or wheezes.  Heart has regular rhythm and rate   Abdomen is soft and non tender with normal bowel sounds  Assessment:  Patient tolerating treatments well  Plan: Continue treatment per original radiation prescription

## 2012-09-08 NOTE — Progress Notes (Signed)
Patient presents to the clinic today accompanied by his two sons for a PUT with Dr. Roselind Messier. Patient alert and oriented to person,place, and time. No distress noted. Steady gait noted. Pleasant affect noted. Patient denies pain at this time. Patient reports fatigue and feeling "blah." Patient reports that he continues to drive himself to work each day. Patient reports episodes of nausea toward the end of last week but, that antiemetics resolved this issue. Three pound weight loss noted since 09/01/2012. Patient reports decreased appetite and taste changes. Patient reports continued dry mouth despite using biotene. Patient reports a dry cough but, that it has improved slightly. Patient reports shortness of breath continues but, is no worse. Patient reports he has noted his voice is more hoarse. Reported all findings to Dr. Roselind Messier.

## 2012-09-08 NOTE — Telephone Encounter (Signed)
needed to r/s nut appt,done

## 2012-09-09 ENCOUNTER — Ambulatory Visit: Payer: Medicare Other

## 2012-09-09 ENCOUNTER — Encounter (HOSPITAL_COMMUNITY): Payer: Self-pay

## 2012-09-09 ENCOUNTER — Ambulatory Visit
Admission: RE | Admit: 2012-09-09 | Discharge: 2012-09-09 | Disposition: A | Discharge status: Home / Self Care | Payer: Medicare Other | Source: Ambulatory Visit | Attending: Radiation Oncology | Admitting: Radiation Oncology

## 2012-09-09 ENCOUNTER — Other Ambulatory Visit: Payer: Self-pay | Admitting: Internal Medicine

## 2012-09-09 ENCOUNTER — Ambulatory Visit (HOSPITAL_COMMUNITY)
Admission: RE | Admit: 2012-09-09 | Discharge: 2012-09-09 | Disposition: A | Discharge status: Home / Self Care | Payer: Medicare Other | Source: Ambulatory Visit | Attending: Internal Medicine | Admitting: Internal Medicine

## 2012-09-09 ENCOUNTER — Encounter: Payer: Medicare Other | Admitting: Nutrition

## 2012-09-09 VITALS — BP 121/57 | HR 76 | Temp 97.6°F | Resp 12 | Ht 71.0 in | Wt 201.0 lb

## 2012-09-09 DIAGNOSIS — I2510 Atherosclerotic heart disease of native coronary artery without angina pectoris: Secondary | ICD-10-CM | POA: Insufficient documentation

## 2012-09-09 DIAGNOSIS — Z951 Presence of aortocoronary bypass graft: Secondary | ICD-10-CM | POA: Insufficient documentation

## 2012-09-09 DIAGNOSIS — N183 Chronic kidney disease, stage 3 unspecified: Secondary | ICD-10-CM | POA: Insufficient documentation

## 2012-09-09 DIAGNOSIS — E7800 Pure hypercholesterolemia, unspecified: Secondary | ICD-10-CM | POA: Insufficient documentation

## 2012-09-09 DIAGNOSIS — E119 Type 2 diabetes mellitus without complications: Secondary | ICD-10-CM | POA: Insufficient documentation

## 2012-09-09 DIAGNOSIS — Z8546 Personal history of malignant neoplasm of prostate: Secondary | ICD-10-CM | POA: Insufficient documentation

## 2012-09-09 DIAGNOSIS — C3490 Malignant neoplasm of unspecified part of unspecified bronchus or lung: Secondary | ICD-10-CM

## 2012-09-09 DIAGNOSIS — I129 Hypertensive chronic kidney disease with stage 1 through stage 4 chronic kidney disease, or unspecified chronic kidney disease: Secondary | ICD-10-CM | POA: Insufficient documentation

## 2012-09-09 DIAGNOSIS — K219 Gastro-esophageal reflux disease without esophagitis: Secondary | ICD-10-CM | POA: Insufficient documentation

## 2012-09-09 LAB — CBC
HCT: 36.5 % — ABNORMAL LOW (ref 39.0–52.0)
Hemoglobin: 12.5 g/dL — ABNORMAL LOW (ref 13.0–17.0)
MCH: 28.1 pg (ref 26.0–34.0)
MCHC: 34.2 g/dL (ref 30.0–36.0)
MCV: 82 fL (ref 78.0–100.0)
RBC: 4.45 MIL/uL (ref 4.22–5.81)

## 2012-09-09 LAB — PROTIME-INR: Prothrombin Time: 13.1 seconds (ref 11.6–15.2)

## 2012-09-09 MED ORDER — MIDAZOLAM HCL 2 MG/2ML IJ SOLN
INTRAMUSCULAR | Status: AC
  Filled 2012-09-09: qty 6

## 2012-09-09 MED ORDER — LIDOCAINE HCL 1 % IJ SOLN
INTRAMUSCULAR | Status: AC
  Filled 2012-09-09: qty 20

## 2012-09-09 MED ORDER — FENTANYL CITRATE 0.05 MG/ML IJ SOLN
INTRAMUSCULAR | Status: AC
  Filled 2012-09-09: qty 6

## 2012-09-09 MED ORDER — MIDAZOLAM HCL 5 MG/5ML IJ SOLN
INTRAMUSCULAR | Status: AC | PRN
  Administered 2012-09-09: 1 mg via INTRAVENOUS

## 2012-09-09 MED ORDER — MIDAZOLAM HCL 2 MG/2ML IJ SOLN
INTRAMUSCULAR | Status: AC | PRN
  Administered 2012-09-09: 1 mg via INTRAVENOUS

## 2012-09-09 MED ORDER — FENTANYL CITRATE 0.05 MG/ML IJ SOLN
INTRAMUSCULAR | Status: AC | PRN
  Administered 2012-09-09 (×2): 50 ug via INTRAVENOUS

## 2012-09-09 MED ORDER — SODIUM CHLORIDE 0.9 % IV SOLN: INTRAVENOUS | Status: DC

## 2012-09-09 MED ORDER — DEXTROSE-NACL 5-0.9 % IV SOLN
INTRAVENOUS | Status: DC
  Administered 2012-09-09: 09:00:00 via INTRAVENOUS

## 2012-09-09 MED ORDER — CEFAZOLIN SODIUM-DEXTROSE 2-3 GM-% IV SOLR
2.0000 g | INTRAVENOUS | Status: AC
  Administered 2012-09-09: 2 g via INTRAVENOUS
  Filled 2012-09-09: qty 50

## 2012-09-09 NOTE — Procedures (Signed)
Interventional Radiology Procedure Note  Procedure: Placement of a right IJ approach single lumen PowerPort.  Tip is positioned at the superior cavoatrial junction and catheter is ready for immediate use.  Complications: No immediate Recommendations:  - Ok to shower tomorrow - Do not submerge for 7 days - Routine line care   Signed,  Nivek Powley K. Offie Waide, MD Vascular & Interventional Radiologist [facility]  

## 2012-09-09 NOTE — H&P (Signed)
Agree with PA note.  Right IJ Port.  Signed,  Sterling Big, MD Vascular & Interventional Radiologist [facility]

## 2012-09-09 NOTE — H&P (Signed)
Shawn [name] is an 78 y.o. male.   Chief Complaint: Dx lung ca Scheduled for Port a Cath placment HPI: Lung Ca; CAD; HTN; HLD; A fib: Prostate Ca; CHF; DM; GERD  Past Medical History  Diagnosis Date  . CAD (coronary artery disease)     a. 1990 CABGx3: LIMA->LAD, VG->OM, VG->dRCA;  b. 05/2007 VF Arrest/Cath/PCI: 100 VG->OM tx w/ BMS, 95 VG->RCA tx w/ bms;  c. 06/2007 90 Native PDA tx w DES ;  d. 05/2009 Echo EF 60-65%, nl WM, mild MR; e. normal lexiscan myoview 03/30/12  . HTN (hypertension)   . Hypercholesteremia   . Atrial fibrillation     a. had been on tikosyn -> d/c 09/2010;  b. Was on Amio ->d/c 09/2011;  c.  anticoagulated w/ Pradaxa  . Junctional ectopic tachycardia     a. noted 09/2010  . Sinus bradycardia   . Back pain     a. Followed by [facility] - on prednisone  . Prostate cancer   . Unsteady gait     a. in rehab for core strengthening  . Colitis   . Tubular adenoma of colon 02/2007  . Myocardial infarction   . Arthritis   . CHF (congestive heart failure)   . Depression   . Internal hemorrhoids without mention of complication   . Diverticulosis of colon (without mention of hemorrhage)   . Diabetes mellitus     Type 2 NIDDM x 15 years  . CKD (chronic kidney disease), stage III     pt denies CKD  . GERD (gastroesophageal reflux disease)     Past Surgical History  Procedure Date  . Coronary artery bypass graft 1990  . Prostate surgery 01/1992    retropubic prostatectomy  . Cardiac valve surgery   . Coronary angioplasty with stent placement 2008    x 3   . Rt finger trigger a-1 pulley   . Cholecystectomy 05/05/12  . Cholecystectomy 05/05/2012    Procedure: LAPAROSCOPIC CHOLECYSTECTOMY WITH INTRAOPERATIVE CHOLANGIOGRAM;  Surgeon: Mariella Saa, MD;  Location: [facility] OR;  Service: General;  Laterality: N/A;  . Endobronchial ultrasound 08/04/2012    Procedure: ENDOBRONCHIAL ULTRASOUND;  Surgeon: Kalman Shan, MD;  Location: [facility] OR;  Service: Cardiopulmonary;  Laterality:  N/A;  . Video bronchoscopy 08/04/2012    Procedure: VIDEO BRONCHOSCOPY WITH FLUORO;  Surgeon: Kalman Shan, MD;  Location: [facility] OR;  Service: Cardiopulmonary;  Laterality: N/A;    Family History  Problem Relation Age of Onset  . Other Father     died of unknown cause @ young age  . Coronary artery disease Mother     died in late 75's w/ "hardening of the arteries"  . Heart disease Mother   . Heart disease Son   . Diabetes Son   . Colon cancer Neg Hx    Social History:  reports that he quit smoking about 23 years ago. His smoking use included Cigarettes. He quit after 40 years of use. He has never used smokeless tobacco. He reports that he does not drink alcohol or use illicit drugs.  Allergies: No Known Allergies   (Not in a hospital admission)  Results for orders placed during the hospital encounter of 09/09/12 (from the past 48 hour(s))  APTT     Status: Normal   Collection Time   09/09/12  7:11 AM      Component Value Range Comment   aPTT 29  24 - 37 seconds   CBC     Status: Abnormal  Collection Time   09/09/12  7:11 AM      Component Value Range Comment   WBC 17.0 (*) 4.0 - 10.5 K/uL    RBC 4.45  4.22 - 5.81 MIL/uL    Hemoglobin 12.5 (*) 13.0 - 17.0 g/dL    HCT 08.6 (*) 57.8 - 52.0 %    MCV 82.0  78.0 - 100.0 fL    MCH 28.1  26.0 - 34.0 pg    MCHC 34.2  30.0 - 36.0 g/dL    RDW 46.9  62.9 - 52.8 %    Platelets 92 (*) 150 - 400 K/uL   PROTIME-INR     Status: Normal   Collection Time   09/09/12  7:11 AM      Component Value Range Comment   Prothrombin Time 13.1  11.6 - 15.2 seconds    INR 1.00  0.00 - 1.49   GLUCOSE, CAPILLARY     Status: Normal   Collection Time   09/09/12  7:39 AM      Component Value Range Comment   Glucose-Capillary 74  70 - 99 mg/dL    No results found.  Review of Systems  Constitutional: Negative for fever.  Respiratory: Negative for shortness of breath.   Cardiovascular: Negative for chest pain.  Gastrointestinal: Negative for  nausea and vomiting.  Neurological: Negative for weakness and headaches.    Blood pressure 131/71, pulse 94, temperature 97.6 F (36.4 C), temperature source Oral, resp. rate 18, height 5\' 11"  (1.803 m), weight 201 lb (91.173 kg), SpO2 100.00%. Physical Exam  Constitutional: He is oriented to person, place, and time.  Cardiovascular: Normal rate, regular rhythm and normal heart sounds.   No murmur heard. Respiratory: Effort normal and breath sounds normal. He has no wheezes.  GI: Soft. Bowel sounds are normal. There is no tenderness.  Musculoskeletal: Normal range of motion.  Neurological: He is alert and oriented to person, place, and time.  Skin: Skin is warm and dry.  Psychiatric: He has a normal mood and affect. His behavior is normal. Judgment and thought content normal.     Assessment/Plan Lung Ca dx Scheduled for [facility] for chemo Pt aware of procedure benefits and risks and agreeable to proceed Consent signed and in chart   Mylani Gentry A 09/09/2012, 8:05 AM

## 2012-09-10 ENCOUNTER — Ambulatory Visit
Admission: RE | Admit: 2012-09-10 | Discharge: 2012-09-10 | Disposition: A | Discharge status: Home / Self Care | Payer: Medicare Other | Source: Ambulatory Visit | Attending: Radiation Oncology | Admitting: Radiation Oncology

## 2012-09-10 ENCOUNTER — Encounter: Payer: Self-pay | Admitting: Cardiology

## 2012-09-10 ENCOUNTER — Ambulatory Visit: Payer: Medicare Other

## 2012-09-10 ENCOUNTER — Ambulatory Visit (INDEPENDENT_AMBULATORY_CARE_PROVIDER_SITE_OTHER): Payer: Medicare Other | Admitting: Cardiology

## 2012-09-10 VITALS — BP 107/64 | HR 84 | Ht 71.0 in | Wt 203.1 lb

## 2012-09-10 DIAGNOSIS — I25810 Atherosclerosis of coronary artery bypass graft(s) without angina pectoris: Secondary | ICD-10-CM

## 2012-09-10 DIAGNOSIS — C3490 Malignant neoplasm of unspecified part of unspecified bronchus or lung: Secondary | ICD-10-CM

## 2012-09-10 DIAGNOSIS — I4891 Unspecified atrial fibrillation: Secondary | ICD-10-CM

## 2012-09-10 NOTE — Progress Notes (Signed)
HPI:  The patient is in for followup. He is a wonderful gentleman who is undergone extensive cancer therapy over the last several weeks. His functional status continues to remain relatively good, he continues to keep a good attitude. He denies any chest pain at the  present time.  There are no new cardiac symptoms  Current Outpatient Prescriptions  Medication Sig Dispense Refill  . alum & mag hydroxide-simeth (MAALOX/MYLANTA) 200-200-20 MG/5ML suspension Take 30 mLs by mouth every 6 (six) hours as needed. For indigestion      . aspirin 81 MG tablet Take 1 tablet (81 mg total) by mouth every other day.  30 tablet  1  . furosemide (LASIX) 20 MG tablet Take 20 mg by mouth daily.      Marland Kitchen glipiZIDE (GLUCOTROL) 10 MG tablet Take 10 mg by mouth daily.        Marland Kitchen levothyroxine (SYNTHROID, LEVOTHROID) 75 MCG tablet Take 75 mcg by mouth daily.      Marland Kitchen lidocaine-prilocaine (EMLA) cream Apply topically as needed. Apply to port 1 hour before chemotherapy, cover with plastic dressing.  30 g  0  . Linaclotide (LINZESS) 145 MCG CAPS Take 145 mcg by mouth every other day.      . lisinopril (PRINIVIL,ZESTRIL) 10 MG tablet Take 10 mg by mouth daily.      . Multiple Vitamin (MULTIVITAMIN) tablet Take 1 tablet by mouth daily.        . nitroGLYCERIN (NITROSTAT) 0.4 MG SL tablet Place 0.4 mg under the tongue every 5 (five) minutes as needed. For chest pain      . potassium chloride (K-DUR,KLOR-CON) 10 MEQ tablet Take 10 mEq by mouth daily.      . prochlorperazine (COMPAZINE) 10 MG tablet Take 10 mg by mouth every 6 (six) hours as needed.      . Rivaroxaban (XARELTO) 15 MG TABS tablet Take 1 tablet (15 mg total) by mouth daily.  30 tablet  1  . rosuvastatin (CRESTOR) 10 MG tablet Take 10 mg by mouth daily.      . [DISCONTINUED] rivastigmine (EXELON) 4.6 mg/24hr Place 1 patch onto the skin daily.        No Known Allergies  Past Medical History  Diagnosis Date  . CAD (coronary artery disease)     a. 1990 CABGx3:  LIMA->LAD, VG->OM, VG->dRCA;  b. 05/2007 VF Arrest/Cath/PCI: 100 VG->OM tx w/ BMS, 95 VG->RCA tx w/ bms;  c. 06/2007 90 Native PDA tx w DES ;  d. 05/2009 Echo EF 60-65%, nl WM, mild MR; e. normal lexiscan myoview 03/30/12  . HTN (hypertension)   . Hypercholesteremia   . Atrial fibrillation     a. had been on tikosyn -> d/c 09/2010;  b. Was on Amio ->d/c 09/2011;  c.  anticoagulated w/ Pradaxa  . Junctional ectopic tachycardia     a. noted 09/2010  . Sinus bradycardia   . Back pain     a. Followed by [facility] - on prednisone  . Prostate cancer   . Unsteady gait     a. in rehab for core strengthening  . Colitis   . Tubular adenoma of colon 02/2007  . Myocardial infarction   . Arthritis   . CHF (congestive heart failure)   . Depression   . Internal hemorrhoids without mention of complication   . Diverticulosis of colon (without mention of hemorrhage)   . Diabetes mellitus     Type 2 NIDDM x 15 years  . CKD (chronic kidney  disease), stage III     pt denies CKD  . GERD (gastroesophageal reflux disease)     Past Surgical History  Procedure Date  . Coronary artery bypass graft 1990  . Prostate surgery 01/1992    retropubic prostatectomy  . Cardiac valve surgery   . Coronary angioplasty with stent placement 2008    x 3   . Rt finger trigger a-1 pulley   . Cholecystectomy 05/05/12  . Cholecystectomy 05/05/2012    Procedure: LAPAROSCOPIC CHOLECYSTECTOMY WITH INTRAOPERATIVE CHOLANGIOGRAM;  Surgeon: Mariella Saa, MD;  Location: [facility] OR;  Service: General;  Laterality: N/A;  . Endobronchial ultrasound 08/04/2012    Procedure: ENDOBRONCHIAL ULTRASOUND;  Surgeon: Kalman Shan, MD;  Location: [facility] OR;  Service: Cardiopulmonary;  Laterality: N/A;  . Video bronchoscopy 08/04/2012    Procedure: VIDEO BRONCHOSCOPY WITH FLUORO;  Surgeon: Kalman Shan, MD;  Location: [facility] OR;  Service: Cardiopulmonary;  Laterality: N/A;    Family History  Problem Relation Age of Onset  . Other Father     died of  unknown cause @ young age  . Coronary artery disease Mother     died in late 72's w/ "hardening of the arteries"  . Heart disease Mother   . Heart disease Son   . Diabetes Son   . Colon cancer Neg Hx     History   Social History  . Marital Status: Widowed    Spouse Name: N/A    Number of Children: 4  . Years of Education: N/A   Occupational History  . 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 Smoking status: Former Smoker -- 40 years    Types: Cigarettes    Quit date: 09/23/1988  . Smokeless tobacco: Never Used     Comment: quit 20+ years ago  . Alcohol Use: No  . Drug Use: No  . Sexually Active: No   Other Topics Concern  . Not on file   Social History Narrative   The patient resides in [place] alone.  He is widowed.  He has 4 children, 9 grandchildren.  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  He denies any alcohol, drugs, or herbal medication.  He tries  to maintain a low-fat diet.  He states that he does exercise somewhat with walking, and he uses a stationary bike for a few minutes every other day.       ROS: Please see the HPI.  All other systems reviewed and negative.  PHYSICAL EXAM:  BP 107/64  Pulse 84  Ht 5\' 11"  (1.803 m)  Wt 203 lb 1.9 oz (92.135 kg)  BMI 28.33 kg/m2  General: Well developed, well nourished, in no acute distress. Head:  Normocephalic and atraumatic. Neck: no JVD.  Central line port covered by bandage.   Lungs: Clear to auscultation and percussion. Heart: Normal S1 and S2.  No murmur, rubs or gallops.  Pulses: Pulses normal in all 4 extremities. Extremities: No clubbing or cyanosis. No edema. Neurologic: Alert and oriented x 3.  EKG:  NSR.  WNL.    ASSESSMENT AND PLAN:

## 2012-09-10 NOTE — Patient Instructions (Addendum)
Your physician recommends that you schedule a follow-up appointment in: 2 MONTHS WITH DR Riley Kill

## 2012-09-11 ENCOUNTER — Ambulatory Visit
Admission: RE | Admit: 2012-09-11 | Discharge: 2012-09-11 | Disposition: A | Discharge status: Home / Self Care | Payer: Medicare Other | Source: Ambulatory Visit | Attending: Radiation Oncology | Admitting: Radiation Oncology

## 2012-09-11 ENCOUNTER — Ambulatory Visit: Payer: Medicare Other

## 2012-09-14 ENCOUNTER — Ambulatory Visit
Admission: RE | Admit: 2012-09-14 | Discharge: 2012-09-14 | Disposition: A | Discharge status: Home / Self Care | Payer: Medicare Other | Source: Ambulatory Visit | Attending: Radiation Oncology | Admitting: Radiation Oncology

## 2012-09-14 ENCOUNTER — Ambulatory Visit: Payer: Medicare Other

## 2012-09-15 ENCOUNTER — Telehealth: Payer: Self-pay | Admitting: Internal Medicine

## 2012-09-15 ENCOUNTER — Encounter: Payer: Self-pay | Admitting: Radiation Oncology

## 2012-09-15 ENCOUNTER — Ambulatory Visit
Admission: RE | Admit: 2012-09-15 | Discharge: 2012-09-15 | Disposition: A | Discharge status: Home / Self Care | Payer: Medicare Other | Source: Ambulatory Visit | Attending: Radiation Oncology | Admitting: Radiation Oncology

## 2012-09-15 ENCOUNTER — Encounter: Payer: Self-pay | Admitting: Physician Assistant

## 2012-09-15 ENCOUNTER — Other Ambulatory Visit: Payer: Medicare Other | Admitting: Lab

## 2012-09-15 ENCOUNTER — Ambulatory Visit: Payer: Medicare Other

## 2012-09-15 ENCOUNTER — Ambulatory Visit (HOSPITAL_BASED_OUTPATIENT_CLINIC_OR_DEPARTMENT_OTHER): Payer: Medicare Other | Admitting: Physician Assistant

## 2012-09-15 ENCOUNTER — Ambulatory Visit: Payer: Medicare Other | Admitting: Physician Assistant

## 2012-09-15 ENCOUNTER — Telehealth: Payer: Self-pay | Admitting: *Deleted

## 2012-09-15 ENCOUNTER — Ambulatory Visit (HOSPITAL_BASED_OUTPATIENT_CLINIC_OR_DEPARTMENT_OTHER): Payer: Medicare Other

## 2012-09-15 ENCOUNTER — Other Ambulatory Visit (HOSPITAL_BASED_OUTPATIENT_CLINIC_OR_DEPARTMENT_OTHER): Payer: Medicare Other | Admitting: Lab

## 2012-09-15 VITALS — BP 127/76 | HR 88 | Resp 18

## 2012-09-15 VITALS — BP 154/73 | HR 84 | Temp 96.9°F | Resp 18 | Ht 71.0 in | Wt 204.8 lb

## 2012-09-15 DIAGNOSIS — Z5111 Encounter for antineoplastic chemotherapy: Secondary | ICD-10-CM

## 2012-09-15 DIAGNOSIS — C3490 Malignant neoplasm of unspecified part of unspecified bronchus or lung: Secondary | ICD-10-CM

## 2012-09-15 LAB — CBC WITH DIFFERENTIAL/PLATELET
Basophils Absolute: 0.1 10*3/uL (ref 0.0–0.1)
Eosinophils Absolute: 0 10*3/uL (ref 0.0–0.5)
HGB: 11.3 g/dL — ABNORMAL LOW (ref 13.0–17.1)
LYMPH%: 14.1 % (ref 14.0–49.0)
MCV: 83.3 fL (ref 79.3–98.0)
MONO#: 1 10*3/uL — ABNORMAL HIGH (ref 0.1–0.9)
NEUT#: 8 10*3/uL — ABNORMAL HIGH (ref 1.5–6.5)
Platelets: 380 10*3/uL (ref 140–400)
RBC: 4.08 10*6/uL — ABNORMAL LOW (ref 4.20–5.82)
WBC: 10.5 10*3/uL — ABNORMAL HIGH (ref 4.0–10.3)

## 2012-09-15 LAB — COMPREHENSIVE METABOLIC PANEL (CC13)
Albumin: 3.2 g/dL — ABNORMAL LOW (ref 3.5–5.0)
BUN: 16 mg/dL (ref 7.0–26.0)
CO2: 22 mEq/L (ref 22–29)
Glucose: 84 mg/dl (ref 70–99)
Potassium: 4.4 mEq/L (ref 3.5–5.1)
Sodium: 139 mEq/L (ref 136–145)
Total Bilirubin: 0.35 mg/dL (ref 0.20–1.20)
Total Protein: 6.2 g/dL — ABNORMAL LOW (ref 6.4–8.3)

## 2012-09-15 MED ORDER — HEPARIN SOD (PORK) LOCK FLUSH 100 UNIT/ML IV SOLN
500.0000 [IU] | Freq: Once | INTRAVENOUS | Status: AC | PRN
  Administered 2012-09-15: 500 [IU]
  Filled 2012-09-15: qty 5

## 2012-09-15 MED ORDER — SUCRALFATE 1 GM/10ML PO SUSP: 1.0000 g | Freq: Four times a day (QID) | ORAL | Status: DC

## 2012-09-15 MED ORDER — DEXAMETHASONE SODIUM PHOSPHATE 4 MG/ML IJ SOLN
20.0000 mg | Freq: Once | INTRAMUSCULAR | Status: AC
  Administered 2012-09-15: 20 mg via INTRAVENOUS

## 2012-09-15 MED ORDER — SODIUM CHLORIDE 0.9 % IJ SOLN
10.0000 mL | INTRAMUSCULAR | Status: DC | PRN
  Administered 2012-09-15: 10 mL
  Filled 2012-09-15: qty 10

## 2012-09-15 MED ORDER — SODIUM CHLORIDE 0.9 % IV SOLN
400.0000 mg | Freq: Once | INTRAVENOUS | Status: AC
  Administered 2012-09-15: 400 mg via INTRAVENOUS
  Filled 2012-09-15: qty 40

## 2012-09-15 MED ORDER — SODIUM CHLORIDE 0.9 % IV SOLN
100.0000 mg/m2 | Freq: Once | INTRAVENOUS | Status: AC
  Administered 2012-09-15: 220 mg via INTRAVENOUS
  Filled 2012-09-15: qty 11

## 2012-09-15 MED ORDER — ONDANSETRON 16 MG/50ML IVPB (CHCC)
16.0000 mg | Freq: Once | INTRAVENOUS | Status: AC
  Administered 2012-09-15: 16 mg via INTRAVENOUS

## 2012-09-15 MED ORDER — SODIUM CHLORIDE 0.9 % IV SOLN
Freq: Once | INTRAVENOUS | Status: AC
  Administered 2012-09-15: 11:00:00 via INTRAVENOUS

## 2012-09-15 MED ORDER — SODIUM CHLORIDE 0.9 % IV SOLN: 399.0000 mg | Freq: Once | INTRAVENOUS | Status: DC

## 2012-09-15 NOTE — Patient Instructions (Signed)
[facility] Discharge Instructions for Patients Receiving Chemotherapy  Today you received the following chemotherapy agents Carboplatin and Etoposide.  To help prevent nausea and vomiting after your treatment, we encourage you to take your nausea medication.   If you develop nausea and vomiting that is not controlled by your nausea medication, call the clinic. If it is after clinic hours your family physician or the after hours number for the clinic or go to the Emergency Department.   BELOW ARE SYMPTOMS THAT SHOULD BE REPORTED IMMEDIATELY:  *FEVER GREATER THAN 100.5 F  *CHILLS WITH OR WITHOUT FEVER  NAUSEA AND VOMITING THAT IS NOT CONTROLLED WITH YOUR NAUSEA MEDICATION  *UNUSUAL SHORTNESS OF BREATH  *UNUSUAL BRUISING OR BLEEDING  TENDERNESS IN MOUTH AND THROAT WITH OR WITHOUT PRESENCE OF ULCERS  *URINARY PROBLEMS  *BOWEL PROBLEMS  UNUSUAL RASH Items with * indicate a potential emergency and should be followed up as soon as possible.  One of the nurses will contact you 24 hours after your treatment. Please let the nurse know about any problems that you may have experienced. Feel free to call the clinic you have any questions or concerns. The clinic phone number is [phone].   I have been informed and understand all the instructions given to me. I know to contact the clinic, my physician, or go to the Emergency Department if any problems should occur. I do not have any questions at this time, but understand that I may call the clinic during office hours   should I have any questions or need assistance in obtaining follow up care.    __________________________________________  _____________  __________ Signature of Patient or Authorized Representative            Date                   Time    __________________________________________ Nurse's Signature    

## 2012-09-15 NOTE — Telephone Encounter (Signed)
appts made and printed for pt,pt asked to move the 12/31 NUT appt to a later time,awar that chemo will be added and aware that cen. Sch. Will call with the scan appt   anne

## 2012-09-15 NOTE — Progress Notes (Signed)
Patient presents to the clinic today accompanied by her son for PUT with Dr. Roselind Messier. Patient alert and oriented to person, place, and time. No distress noted. Steady gait noted. Pleasant affect noted. Patient denies pain at this time. Patient reports occasional pain associated with swallowing. Also, patient reports that his port continues to be sore. Patient reports he had chemo today and is schedule again for chemo on Thursday and Friday with a injection Saturday. Patient reports a dry cough. Patient report fatigue. Patient reports interrupted sleep patterns. Patient reports decreased appetite. Patient denies skin changes in treatment field. Reported all findings to Dr. Roselind Messier.

## 2012-09-15 NOTE — Telephone Encounter (Signed)
Per staff and POF I have scheduled appts. JMW  

## 2012-09-15 NOTE — Patient Instructions (Addendum)
Continue weekly labs Follow up with Dr. Arbutus Ped in 3 weeks with a restaging Ct scan of your chest to re-evaluate your disease

## 2012-09-15 NOTE — Progress Notes (Signed)
[facility]    Radiation Oncology [address] [place]     Maryln Gottron, M.D. [place], [place] [address]               Billie Lade, M.D., Ph.D. Phone: [phone]      Molli Hazard A. Kathrynn Running, M.D. Fax: [phone]      Radene Gunning, M.D., Ph.D.         Lurline Hare, M.D.         Grayland Jack, M.D Weekly Treatment Management Note  Name: Shawn [name]     MRN: [id number]        CSN: [id number] Date: 09/15/2012      DOB: 1928-08-09  CC: Minda Meo, MD         Jacky Kindle    Status: Outpatient  Diagnosis: The encounter diagnosis was Poorly differentiated lung carcinoma, favoring extensive stage small cell lung cancer - Nov 2013, ECOG 1.      Narrative: Shawn [name] was seen today for weekly treatment management. The chart was checked and CBCT  were reviewed. He is starting to have some fatigue. He is also starting to have some modest esophageal problems and in light of this I have given him a prescription for Carafate.  He denies any breathing problems. He did receive additional chemotherapy earlier today.  Review of patient's allergies indicates no known allergies.  Current Outpatient Prescriptions  Medication Sig Dispense Refill  . alum & mag hydroxide-simeth (MAALOX/MYLANTA) 200-200-20 MG/5ML suspension Take 30 mLs by mouth every 6 (six) hours as needed. For indigestion      . aspirin 81 MG tablet Take 1 tablet (81 mg total) by mouth every other day.  30 tablet  1  . furosemide (LASIX) 20 MG tablet Take 20 mg by mouth daily.      Marland Kitchen glipiZIDE (GLUCOTROL) 10 MG tablet Take 10 mg by mouth daily.        Marland Kitchen levothyroxine (SYNTHROID, LEVOTHROID) 75 MCG tablet Take 75 mcg by mouth daily.      Marland Kitchen lidocaine-prilocaine (EMLA) cream Apply topically as needed. Apply to port 1 hour before chemotherapy, cover with plastic dressing.  30 g  0  . Linaclotide (LINZESS) 145 MCG CAPS Take 145 mcg by mouth every other day.      . lisinopril (PRINIVIL,ZESTRIL) 10 MG tablet  Take 10 mg by mouth daily.      . Multiple Vitamin (MULTIVITAMIN) tablet Take 1 tablet by mouth daily.        . nitroGLYCERIN (NITROSTAT) 0.4 MG SL tablet Place 0.4 mg under the tongue every 5 (five) minutes as needed. For chest pain      . potassium chloride (K-DUR,KLOR-CON) 10 MEQ tablet Take 10 mEq by mouth daily.      . prochlorperazine (COMPAZINE) 10 MG tablet Take 10 mg by mouth every 6 (six) hours as needed.      . Rivaroxaban (XARELTO) 15 MG TABS tablet Take 1 tablet (15 mg total) by mouth daily.  30 tablet  1  . rosuvastatin (CRESTOR) 10 MG tablet Take 10 mg by mouth daily.      . sucralfate (CARAFATE) 1 GM/10ML suspension Take 10 mLs (1 g total) by mouth 4 (four) times daily.  420 mL  0  . [DISCONTINUED] rivastigmine (EXELON) 4.6 mg/24hr Place 1 patch onto the skin daily.       Labs:  Lab Results  Component Value Date   WBC 10.5* 09/15/2012   HGB 11.3* 09/15/2012  HCT 34.0* 09/15/2012   MCV 83.3 09/15/2012   PLT 380 09/15/2012   Lab Results  Component Value Date   CREATININE 1.3 09/15/2012   BUN 16.0 09/15/2012   NA 139 09/15/2012   K 4.4 09/15/2012   CL 107 09/15/2012   CO2 22 09/15/2012   Lab Results  Component Value Date   ALT 16 09/15/2012   AST 19 09/15/2012   PHOS 4.1 05/29/2009   BILITOT 0.35 09/15/2012    Physical Examination:  blood pressure is 127/76 and his pulse is 88. His respiration is 18 and oxygen saturation is 97%.    Wt Readings from Last 3 Encounters:  09/15/12 204 lb 12.8 oz (92.897 kg)  09/10/12 203 lb 1.9 oz (92.135 kg)  09/09/12 201 lb (91.173 kg)    The oral cavity is moist without secondary infection Lungs - Normal respiratory effort, chest expands symmetrically. Lungs are clear to auscultation, no crackles or wheezes.  Heart has regular rhythm and rate  Abdomen is soft and non tender with normal bowel sounds  Assessment:  Patient tolerating treatments well except for issues as above  Plan: Continue treatment per original radiation  prescription

## 2012-09-15 NOTE — Progress Notes (Signed)
No images are attached to the encounter. No scans are attached to the encounter. No scans are attached to the encounter. [facility] OFFICE PROGRESS NOTE  Minda Meo, MD [address] [facility], [place]. [place] [address]  DIAGNOSIS: Limited stage small cell lung cancer pending further staging workup  PRIOR THERAPY: None  CURRENT THERAPY: Systemic chemotherapy with carboplatin for an AUC of 5 given on day 1 and etoposide at 100 mg per meter squared given on days 1, 2 and 3 with Neulasta support given on day 4 status post 1 cycle. This will be given concurrent with radiotherapy under the care of Dr. Roselind Messier  INTERVAL HISTORY: Shawn [name] 78 y.o. male returns for a scheduled regular office visit for followup of his small cell lung cancer accompanied by 2 family members. He complains of generalized malaise. He complains of having no appetite. He is ingesting small meals and manages to drink 2-3 Ensure or Boost supplements daily. He states he ingests two such supplements per day.  He is now status post one cycle of systemic chemotherapy with carboplatin and etoposide with Neulasta support concurrent with radiation.  He denied issues with nausea, vomiting, diarrhea or constipation. He denied any fever or chills, shortness of breath, hemoptysis or cough.   MEDICAL HISTORY: Past Medical History  Diagnosis Date  . CAD (coronary artery disease)     a. 1990 CABGx3: LIMA->LAD, VG->OM, VG->dRCA;  b. 05/2007 VF Arrest/Cath/PCI: 100 VG->OM tx w/ BMS, 95 VG->RCA tx w/ bms;  c. 06/2007 90 Native PDA tx w DES ;  d. 05/2009 Echo EF 60-65%, nl WM, mild MR; e. normal lexiscan myoview 03/30/12  . HTN (hypertension)   . Hypercholesteremia   . Atrial fibrillation     a. had been on tikosyn -> d/c 09/2010;  b. Was on Amio ->d/c 09/2011;  c.  anticoagulated w/ Pradaxa  . Junctional ectopic tachycardia     a. noted 09/2010  . Sinus bradycardia   . Back pain     a. [facility] - [facility]  . Prostate cancer   . Unsteady gait     a. in rehab for core strengthening  . Colitis   . Tubular adenoma of colon 02/2007  . Myocardial infarction   . Arthritis   . CHF (congestive heart failure)   . Depression   . Internal hemorrhoids without mention of complication   . Diverticulosis of colon (without mention of hemorrhage)   . Diabetes mellitus     Type 2 NIDDM x 15 years  . CKD (chronic kidney disease), stage III     pt denies CKD  . GERD (gastroesophageal reflux disease)     ALLERGIES:   has no known allergies.  MEDICATIONS:  Current Outpatient Prescriptions  Medication Sig Dispense Refill  . alum & mag hydroxide-simeth (MAALOX/MYLANTA) 200-200-20 MG/5ML suspension Take 30 mLs by mouth every 6 (six) hours as needed. For indigestion      . aspirin 81 MG tablet Take 1 tablet (81 mg total) by mouth every other day.  30 tablet  1  . furosemide (LASIX) 20 MG tablet Take 20 mg by mouth daily.      Marland Kitchen glipiZIDE (GLUCOTROL) 10 MG tablet Take 10 mg by mouth daily.        Marland Kitchen levothyroxine (SYNTHROID, LEVOTHROID) 75 MCG tablet Take 75 mcg by mouth daily.      Marland Kitchen lidocaine-prilocaine (EMLA) cream Apply topically as needed. Apply to port 1 hour  before chemotherapy, cover with plastic dressing.  30 g  0  . Linaclotide (LINZESS) 145 MCG CAPS Take 145 mcg by mouth every other day.      . lisinopril (PRINIVIL,ZESTRIL) 10 MG tablet Take 10 mg by mouth daily.      . Multiple Vitamin (MULTIVITAMIN) tablet Take 1 tablet by mouth daily.        . nitroGLYCERIN (NITROSTAT) 0.4 MG SL tablet Place 0.4 mg under the tongue every 5 (five) minutes as needed. For chest pain      . potassium chloride (K-DUR,KLOR-CON) 10 MEQ tablet Take 10 mEq by mouth daily.      . prochlorperazine (COMPAZINE) 10 MG tablet Take 10 mg by mouth every 6 (six) hours as needed.      . Rivaroxaban (XARELTO) 15 MG TABS tablet Take 1 tablet (15 mg total) by mouth daily.  30 tablet  1  . rosuvastatin (CRESTOR) 10  MG tablet Take 10 mg by mouth daily.      . [DISCONTINUED] rivastigmine (EXELON) 4.6 mg/24hr Place 1 patch onto the skin daily.       No current facility-administered medications for this visit.   Facility-Administered Medications Ordered in Other Visits  Medication Dose Route Frequency Provider Last Rate Last Dose  . etoposide (VEPESID) 220 mg in sodium chloride 0.9 % 600 mL chemo infusion  100 mg/m2 (Treatment Plan Actual) Intravenous Once Conni Slipper, PA 611 mL/hr at 09/15/12 1150 220 mg at 09/15/12 1150  . heparin lock flush 100 unit/mL  500 Units Intracatheter Once PRN Conni Slipper, PA      . sodium chloride 0.9 % injection 10 mL  10 mL Intracatheter PRN Conni Slipper, PA        SURGICAL HISTORY:  Past Surgical History  Procedure Date  . Coronary artery bypass graft 1990  . Prostate surgery 01/1992    retropubic prostatectomy  . Cardiac valve surgery   . Coronary angioplasty with stent placement 2008    x 3   . Rt finger trigger a-1 pulley   . Cholecystectomy 05/05/12  . Cholecystectomy 05/05/2012    Procedure: LAPAROSCOPIC CHOLECYSTECTOMY WITH INTRAOPERATIVE CHOLANGIOGRAM;  Surgeon: Mariella Saa, MD;  Location: [facility] OR;  Service: General;  Laterality: N/A;  . Endobronchial ultrasound 08/04/2012    Procedure: ENDOBRONCHIAL ULTRASOUND;  Surgeon: Kalman Shan, MD;  Location: [facility] OR;  Service: Cardiopulmonary;  Laterality: N/A;  . Video bronchoscopy 08/04/2012    Procedure: VIDEO BRONCHOSCOPY WITH FLUORO;  Surgeon: Kalman Shan, MD;  Location: [facility] OR;  Service: Cardiopulmonary;  Laterality: N/A;    REVIEW OF SYSTEMS:  Pertinent items are noted in HPI.   PHYSICAL EXAMINATION: General appearance: alert, cooperative, appears stated age and no distress Head: Normocephalic, without obvious abnormality, atraumatic Neck: no adenopathy, no carotid bruit, no JVD, supple, symmetrical, trachea midline and thyroid not enlarged, symmetric, no  tenderness/mass/nodules Lymph nodes: Cervical, supraclavicular, and axillary nodes normal. Resp: clear to auscultation bilaterally Cardio: regular rate and rhythm, S1, S2 normal, no murmur, click, rub or gallop GI: soft, non-tender; bowel sounds normal; no masses,  no organomegaly Extremities: extremities normal, atraumatic, no cyanosis or edema Neurologic: Alert and oriented X 3, normal strength and tone. Normal symmetric reflexes. Normal coordination and gait  ECOG PERFORMANCE STATUS: 1 - Symptomatic but completely ambulatory  Blood pressure 154/73, pulse 84, temperature 96.9 F (36.1 C), temperature source Oral, resp. rate 18, height 5\' 11"  (1.803 m), weight 204 lb 12.8 oz (92.897 kg).  LABORATORY DATA: Lab  Results  Component Value Date   WBC 10.5* 09/15/2012   HGB 11.3* 09/15/2012   HCT 34.0* 09/15/2012   MCV 83.3 09/15/2012   PLT 380 09/15/2012      Chemistry      Component Value Date/Time   NA 139 09/15/2012 0911   NA 137 07/29/2012 1207   K 4.4 09/15/2012 0911   K 4.2 07/29/2012 1207   CL 107 09/15/2012 0911   CL 103 07/29/2012 1207   CO2 22 09/15/2012 0911   CO2 23 07/29/2012 1207   BUN 16.0 09/15/2012 0911   BUN 20 07/29/2012 1207   CREATININE 1.3 09/15/2012 0911   CREATININE 1.23 07/29/2012 1207   CREATININE 1.48* 05/11/2012 1030      Component Value Date/Time   CALCIUM 8.9 09/15/2012 0911   CALCIUM 9.3 07/29/2012 1207   ALKPHOS 119 09/15/2012 0911   ALKPHOS 117 07/29/2012 1207   AST 19 09/15/2012 0911   AST 35 07/29/2012 1207   ALT 16 09/15/2012 0911   ALT 29 07/29/2012 1207   BILITOT 0.35 09/15/2012 0911   BILITOT 0.8 07/29/2012 1207       RADIOGRAPHIC STUDIES:  Mr Laqueta Jean Wo Contrast  08/25/2012  *RADIOLOGY REPORT*  Clinical Data: Recent diagnosis of lung cancer.  Denies neurological symptoms.  MRI HEAD WITHOUT AND WITH CONTRAST  Technique:  Multiplanar, multiecho pulse sequences of the brain and surrounding structures were obtained according to standard  protocol without and with intravenous contrast  Contrast: 20mL MULTIHANCE GADOBENATE DIMEGLUMINE 529 MG/ML IV SOLN  Comparison: 07/09/2011.  Findings: Left temporal - occipital lobe junction  sub centimeter area of enhancement which appears slightly nodular on the axial imaging (series 12 image 23) and with a slightly gyriform appearance on coronal imaging (series 13 image 8).  This may represent a small metastatic focus although a small area of enhancement related to a subacute infarct is not entirely excluded as cause of this appearance.  Tiny area of enhancement the medial right frontal lobe (series 13 and 17) has an appearance of a vessel on axial with sagittal sequence.  Tiny areas of enhancement right occipital lobe (series 14 images 10 and 11) may represent small vessels/pulsation artifact rather than areas of enhancement.  Attention to this on follow-up.  No acute thrombotic infarct.  Remote small infarcts cerebellum bilaterally.  Remote left thalamic infarct.  Marked confluent white matter type changes periventricular subcortical region.  Global atrophy without hydrocephalus.  Major intracranial vascular structures are patent.  Opacification/mucosal thickening paranasal sinuses most notable involving the right sphenoid sinus air cell.  Transverse ligament hypertrophy.  Scattered tiny blood breakdown products probably related to result of remote hemorrhagic ischemia.  IMPRESSION: Question small metastatic focus posterior left temporal - occipital lobe junction as detailed above.  Remote infarcts and prominent small vessel disease type changes.  Global atrophy.  Paranasal sinus mucosal thickening / opacification most notable right sphenoid sinus.   Original Report Authenticated By: Lacy Duverney, M.D.      ASSESSMENT/PLAN: Patient is a pleasant 78 year old white male recently diagnosed with limited stage small cell lung cancer pending further staging workup. He did have an MRI of the brain with and without  contrast performed on 08/24/2012. There was a questionable small metastatic focus in the posterior left temporal-occipital lobe junction. The patient was discussed with Dr. Arbutus Ped. This is been brought to Dr. Trina Ao attention who recommended a followup MRI the brain in 1-2 months for further evaluation. The patient will proceed with cycle #2 of  his systemic chemotherapy with carboplatin and etoposide with Neulasta support. He'll followup with Dr. Arbutus Ped in 3 weeks with a restaging CT of the chest with contrast to reevaluate his disease. He will continue with weekly labs consisting of a CBC differential and C. met     Baraka Klatt E, PA-C     All questions were answered. The patient knows to call the clinic with any problems, questions or concerns. We can certainly see the patient much sooner if necessary.  I spent 20 minutes counseling the patient face to face. The total time spent in the appointment was 30 minutes.

## 2012-09-17 ENCOUNTER — Ambulatory Visit (HOSPITAL_BASED_OUTPATIENT_CLINIC_OR_DEPARTMENT_OTHER): Payer: Medicare Other

## 2012-09-17 ENCOUNTER — Ambulatory Visit
Admission: RE | Admit: 2012-09-17 | Discharge: 2012-09-17 | Disposition: A | Discharge status: Home / Self Care | Payer: Medicare Other | Source: Ambulatory Visit | Attending: Radiation Oncology | Admitting: Radiation Oncology

## 2012-09-17 ENCOUNTER — Ambulatory Visit: Payer: Medicare Other

## 2012-09-17 VITALS — BP 108/57 | HR 81 | Temp 96.9°F

## 2012-09-17 DIAGNOSIS — C3400 Malignant neoplasm of unspecified main bronchus: Secondary | ICD-10-CM

## 2012-09-17 DIAGNOSIS — Z5111 Encounter for antineoplastic chemotherapy: Secondary | ICD-10-CM

## 2012-09-17 DIAGNOSIS — C3490 Malignant neoplasm of unspecified part of unspecified bronchus or lung: Secondary | ICD-10-CM

## 2012-09-17 MED ORDER — SODIUM CHLORIDE 0.9 % IJ SOLN
10.0000 mL | INTRAMUSCULAR | Status: DC | PRN
  Administered 2012-09-17: 10 mL
  Filled 2012-09-17: qty 10

## 2012-09-17 MED ORDER — HEPARIN SOD (PORK) LOCK FLUSH 100 UNIT/ML IV SOLN
500.0000 [IU] | Freq: Once | INTRAVENOUS | Status: AC | PRN
  Administered 2012-09-17: 500 [IU]
  Filled 2012-09-17: qty 5

## 2012-09-17 MED ORDER — SODIUM CHLORIDE 0.9 % IV SOLN
Freq: Once | INTRAVENOUS | Status: AC
  Administered 2012-09-17: 15:00:00 via INTRAVENOUS

## 2012-09-17 MED ORDER — SODIUM CHLORIDE 0.9 % IV SOLN
100.0000 mg/m2 | Freq: Once | INTRAVENOUS | Status: AC
  Administered 2012-09-17: 220 mg via INTRAVENOUS
  Filled 2012-09-17: qty 11

## 2012-09-17 MED ORDER — PROCHLORPERAZINE MALEATE 10 MG PO TABS
10.0000 mg | ORAL_TABLET | Freq: Once | ORAL | Status: AC
  Administered 2012-09-17: 10 mg via ORAL

## 2012-09-17 NOTE — Patient Instructions (Addendum)
[facility] Discharge Instructions for Patients Receiving Chemotherapy  Today you received the following chemotherapy agents:  etoposide  To help prevent nausea and vomiting after your treatment, we encourage you to take your nausea medication.  Take it as often as prescribed.     If you develop nausea and vomiting that is not controlled by your nausea medication, call the clinic. If it is after clinic hours your family physician or the after hours number for the clinic or go to the Emergency Department.   BELOW ARE SYMPTOMS THAT SHOULD BE REPORTED IMMEDIATELY:  *FEVER GREATER THAN 100.5 F  *CHILLS WITH OR WITHOUT FEVER  NAUSEA AND VOMITING THAT IS NOT CONTROLLED WITH YOUR NAUSEA MEDICATION  *UNUSUAL SHORTNESS OF BREATH  *UNUSUAL BRUISING OR BLEEDING  TENDERNESS IN MOUTH AND THROAT WITH OR WITHOUT PRESENCE OF ULCERS  *URINARY PROBLEMS  *BOWEL PROBLEMS  UNUSUAL RASH Items with * indicate a potential emergency and should be followed up as soon as possible.  Feel free to call the clinic you have any questions or concerns. The clinic phone number is [phone].   I have been informed and understand all the instructions given to me. I know to contact the clinic, my physician, or go to the Emergency Department if any problems should occur. I do not have any questions at this time, but understand that I may call the clinic during office hours   should I have any questions or need assistance in obtaining follow up care.    __________________________________________  _____________  __________ Signature of Patient or Authorized Representative            Date                   Time    __________________________________________ Nurse's Signature    

## 2012-09-18 ENCOUNTER — Ambulatory Visit
Admission: RE | Admit: 2012-09-18 | Discharge: 2012-09-18 | Disposition: A | Discharge status: Home / Self Care | Payer: Medicare Other | Source: Ambulatory Visit | Attending: Radiation Oncology | Admitting: Radiation Oncology

## 2012-09-18 ENCOUNTER — Ambulatory Visit (HOSPITAL_BASED_OUTPATIENT_CLINIC_OR_DEPARTMENT_OTHER): Payer: Medicare Other

## 2012-09-18 ENCOUNTER — Ambulatory Visit: Payer: Medicare Other

## 2012-09-18 VITALS — BP 134/73 | HR 87 | Temp 97.3°F | Resp 20

## 2012-09-18 DIAGNOSIS — Z5111 Encounter for antineoplastic chemotherapy: Secondary | ICD-10-CM

## 2012-09-18 DIAGNOSIS — C3490 Malignant neoplasm of unspecified part of unspecified bronchus or lung: Secondary | ICD-10-CM

## 2012-09-18 DIAGNOSIS — C3400 Malignant neoplasm of unspecified main bronchus: Secondary | ICD-10-CM

## 2012-09-18 MED ORDER — SODIUM CHLORIDE 0.9 % IJ SOLN
10.0000 mL | INTRAMUSCULAR | Status: DC | PRN
  Administered 2012-09-18: 10 mL
  Filled 2012-09-18: qty 10

## 2012-09-18 MED ORDER — SODIUM CHLORIDE 0.9 % IV SOLN
Freq: Once | INTRAVENOUS | Status: AC
  Administered 2012-09-18: 14:00:00 via INTRAVENOUS

## 2012-09-18 MED ORDER — SODIUM CHLORIDE 0.9 % IV SOLN
100.0000 mg/m2 | Freq: Once | INTRAVENOUS | Status: AC
  Administered 2012-09-18: 220 mg via INTRAVENOUS
  Filled 2012-09-18: qty 11

## 2012-09-18 MED ORDER — HEPARIN SOD (PORK) LOCK FLUSH 100 UNIT/ML IV SOLN
500.0000 [IU] | Freq: Once | INTRAVENOUS | Status: AC | PRN
  Administered 2012-09-18: 500 [IU]
  Filled 2012-09-18: qty 5

## 2012-09-18 MED ORDER — PROCHLORPERAZINE MALEATE 10 MG PO TABS
10.0000 mg | ORAL_TABLET | Freq: Once | ORAL | Status: AC
  Administered 2012-09-18: 10 mg via ORAL

## 2012-09-18 NOTE — Patient Instructions (Addendum)
[facility] Discharge Instructions for Patients Receiving Chemotherapy  Today you received the following chemotherapy agents Etoposide.  To help prevent nausea and vomiting after your treatment, we encourage you to take your nausea medication.   If you develop nausea and vomiting that is not controlled by your nausea medication, call the clinic. If it is after clinic hours your family physician or the after hours number for the clinic or go to the Emergency Department.   BELOW ARE SYMPTOMS THAT SHOULD BE REPORTED IMMEDIATELY:  *FEVER GREATER THAN 100.5 F  *CHILLS WITH OR WITHOUT FEVER  NAUSEA AND VOMITING THAT IS NOT CONTROLLED WITH YOUR NAUSEA MEDICATION  *UNUSUAL SHORTNESS OF BREATH  *UNUSUAL BRUISING OR BLEEDING  TENDERNESS IN MOUTH AND THROAT WITH OR WITHOUT PRESENCE OF ULCERS  *URINARY PROBLEMS  *BOWEL PROBLEMS  UNUSUAL RASH Items with * indicate a potential emergency and should be followed up as soon as possible.  One of the nurses will contact you 24 hours after your treatment. Please let the nurse know about any problems that you may have experienced. Feel free to call the clinic you have any questions or concerns. The clinic phone number is [phone].   I have been informed and understand all the instructions given to me. I know to contact the clinic, my physician, or go to the Emergency Department if any problems should occur. I do not have any questions at this time, but understand that I may call the clinic during office hours   should I have any questions or need assistance in obtaining follow up care.    __________________________________________  _____________  __________ Signature of Patient or Authorized Representative            Date                   Time    __________________________________________ Nurse's Signature    

## 2012-09-19 ENCOUNTER — Ambulatory Visit (HOSPITAL_BASED_OUTPATIENT_CLINIC_OR_DEPARTMENT_OTHER): Payer: Medicare Other

## 2012-09-19 VITALS — BP 149/77 | HR 96 | Temp 97.6°F | Resp 16

## 2012-09-19 DIAGNOSIS — C3490 Malignant neoplasm of unspecified part of unspecified bronchus or lung: Secondary | ICD-10-CM

## 2012-09-19 DIAGNOSIS — C3400 Malignant neoplasm of unspecified main bronchus: Secondary | ICD-10-CM

## 2012-09-19 MED ORDER — PEGFILGRASTIM INJECTION 6 MG/0.6ML
6.0000 mg | Freq: Once | SUBCUTANEOUS | Status: AC
  Administered 2012-09-19: 6 mg via SUBCUTANEOUS

## 2012-09-21 ENCOUNTER — Ambulatory Visit
Admission: RE | Admit: 2012-09-21 | Discharge: 2012-09-21 | Disposition: A | Discharge status: Home / Self Care | Payer: Medicare Other | Source: Ambulatory Visit | Attending: Radiation Oncology | Admitting: Radiation Oncology

## 2012-09-21 ENCOUNTER — Ambulatory Visit: Payer: Medicare Other

## 2012-09-22 ENCOUNTER — Encounter: Payer: Self-pay | Admitting: Radiation Oncology

## 2012-09-22 ENCOUNTER — Ambulatory Visit
Admission: RE | Admit: 2012-09-22 | Discharge: 2012-09-22 | Disposition: A | Discharge status: Home / Self Care | Payer: Medicare Other | Source: Ambulatory Visit | Attending: Radiation Oncology | Admitting: Radiation Oncology

## 2012-09-22 ENCOUNTER — Ambulatory Visit: Payer: Medicare Other

## 2012-09-22 ENCOUNTER — Ambulatory Visit: Payer: Medicare Other | Admitting: Nutrition

## 2012-09-22 ENCOUNTER — Encounter: Payer: Medicare Other | Admitting: Nutrition

## 2012-09-22 ENCOUNTER — Other Ambulatory Visit (HOSPITAL_BASED_OUTPATIENT_CLINIC_OR_DEPARTMENT_OTHER): Payer: Medicare Other

## 2012-09-22 VITALS — BP 129/71 | HR 93 | Temp 97.0°F | Resp 22

## 2012-09-22 DIAGNOSIS — C3490 Malignant neoplasm of unspecified part of unspecified bronchus or lung: Secondary | ICD-10-CM

## 2012-09-22 LAB — CBC WITH DIFFERENTIAL/PLATELET
BASO%: 0.3 % (ref 0.0–2.0)
EOS%: 0.2 % (ref 0.0–7.0)
HCT: 32.4 % — ABNORMAL LOW (ref 38.4–49.9)
LYMPH%: 5.4 % — ABNORMAL LOW (ref 14.0–49.0)
MCH: 28.1 pg (ref 27.2–33.4)
MCHC: 32.7 g/dL (ref 32.0–36.0)
NEUT%: 91.5 % — ABNORMAL HIGH (ref 39.0–75.0)
Platelets: 289 10*3/uL (ref 140–400)

## 2012-09-22 LAB — COMPREHENSIVE METABOLIC PANEL (CC13)
ALT: 16 U/L (ref 0–55)
AST: 22 U/L (ref 5–34)
Albumin: 3.1 g/dL — ABNORMAL LOW (ref 3.5–5.0)
Alkaline Phosphatase: 160 U/L — ABNORMAL HIGH (ref 40–150)
BUN: 34 mg/dL — ABNORMAL HIGH (ref 7.0–26.0)
CO2: 21 meq/L — ABNORMAL LOW (ref 22–29)
Calcium: 8.8 mg/dL (ref 8.4–10.4)
Chloride: 108 meq/L — ABNORMAL HIGH (ref 98–107)
Creatinine: 1.3 mg/dL (ref 0.7–1.3)
Glucose: 173 mg/dL — ABNORMAL HIGH (ref 70–99)
Potassium: 4.3 meq/L (ref 3.5–5.1)
Sodium: 138 meq/L (ref 136–145)
Total Bilirubin: 0.95 mg/dL (ref 0.20–1.20)
Total Protein: 6 g/dL — ABNORMAL LOW (ref 6.4–8.3)

## 2012-09-22 MED ORDER — HYDROCODONE-ACETAMINOPHEN 7.5-500 MG/15ML PO SOLN: 15.0000 mL | Freq: Four times a day (QID) | ORAL | Status: DC | PRN

## 2012-09-22 NOTE — Progress Notes (Signed)
Patient is an 78 year old male diagnosed with lung cancer.  Past medical history includes MI, arthritis, CHF, depression, diverticulosis, diabetes, stage III chronic kidney disease, and GERD.  Medications include Mylanta, Lasix, Glucotrol, Synthroid, multivitamin, K-Dur, Compazine, and Crestor.  Labs include glucose of 157, BUN 36, creatinine 1.4, and albumin 3.3 on December 10.  Height: 5 feet 11 inches. Weight: 204.8 pounds.  Usual body weight: 213 pounds. BMI: 27.26.  Patient presents to nutrition appointment with 2 sons. Patient describes poor appetite and occasional nausea and vomiting. He has painful swallowing. He is not prompted to eat but he knows it is important.  Nutrition diagnosis: Food and nutrition related knowledge deficit related to diagnosis of lung cancer and associated treatments as evidenced by no prior need for nutrition related information.  Intervention: I educated patient on the importance of consuming 6 small meals/snacks daily. I've educated him on ways to prompt himself to eat. I've encouraged him to consume oral nutrition supplements at least twice a day. I've educated him on appropriate snacks. I provided handouts and oral nutrition supplement samples for him to take. I've answered his questions.  Monitoring, evaluation, goals: Patient will tolerate increased oral intake to minimize further weight loss.  Next visit: Tuesday, January 14 during chemotherapy.

## 2012-09-22 NOTE — Progress Notes (Signed)
Weekly Management Note:  Site: Left lung/chest Current Dose:  3240  cGy Projected Dose: 5940  cGy  Narrative: The patient is seen today for routine under treatment assessment. CBCT/MVCT images/port films were reviewed. The chart was reviewed.   Shawn [name] is seen today complaining of worsening pain on swallowing. His weight is down 3 pounds over the past week. Shawn [name] had more chemotherapy last week. Shawn [name] was given a prescription for Carafate slurry which has not been very helpful.  Physical Examination:  Filed Vitals:   09/22/12 1526  BP: 129/71  Pulse: 93  Temp: 97 F (36.1 C)  Resp: 22  .  Weight:  . Shawn [name] does not appear to be dehydrated. Head and neck examination: Oral cavity and oropharynx without evidence of candidiasis. Nodes: There is no palpable cervical or supraclavicular lymphadenopathy. Chest there no significant skin changes along his anterior or posterior chest.  Laboratory data:  Lab Results  Component Value Date   WBC 20.7* 09/22/2012   HGB 10.6* 09/22/2012   HCT 32.4* 09/22/2012   MCV 85.9 09/22/2012   PLT 289 09/22/2012   CMP     Component Value Date/Time   NA 138 09/22/2012 1316   NA 137 07/29/2012 1207   K 4.3 09/22/2012 1316   K 4.2 07/29/2012 1207   CL 108* 09/22/2012 1316   CL 103 07/29/2012 1207   CO2 21* 09/22/2012 1316   CO2 23 07/29/2012 1207   GLUCOSE 173* 09/22/2012 1316   GLUCOSE 146* 07/29/2012 1207   BUN 34.0* 09/22/2012 1316   BUN 20 07/29/2012 1207   CREATININE 1.3 09/22/2012 1316   CREATININE 1.23 07/29/2012 1207   CREATININE 1.48* 05/11/2012 1030   CALCIUM 8.8 09/22/2012 1316   CALCIUM 9.3 07/29/2012 1207   PROT 6.0* 09/22/2012 1316   PROT 6.9 07/29/2012 1207   ALBUMIN 3.1* 09/22/2012 1316   ALBUMIN 3.3* 07/29/2012 1207   AST 22 09/22/2012 1316   AST 35 07/29/2012 1207   ALT 16 09/22/2012 1316   ALT 29 07/29/2012 1207   ALKPHOS 160* 09/22/2012 1316   ALKPHOS 117 07/29/2012 1207   BILITOT 0.95 09/22/2012 1316   BILITOT 0.8 07/29/2012 1207   GFRNONAA 53*  07/29/2012 1207   GFRAA 61* 07/29/2012 1207     Impression: Tolerating radiation therapy well, however, 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 I'll start him on Lortab elixir, dispense 480 cc bottle, no refills.  Plan: Continue radiation therapy as planned.

## 2012-09-22 NOTE — Progress Notes (Signed)
Patient and sons camae to nursing asking for rx for pain, patient appt to see Md was changed to Thursday, but patient doesn't have any pain meds, is taking carafate but that is not helping, has difficulty swallowing, and burning in throat,asked Dr.Murray to see  Patient, 99% room airs sats, treated today rad  Noon, saw Zenovia Jarred today alsoo

## 2012-09-24 ENCOUNTER — Ambulatory Visit: Payer: Medicare Other

## 2012-09-24 ENCOUNTER — Telehealth: Payer: Self-pay | Admitting: *Deleted

## 2012-09-24 ENCOUNTER — Ambulatory Visit
Admission: RE | Admit: 2012-09-24 | Discharge: 2012-09-24 | Disposition: A | Discharge status: Home / Self Care | Payer: Medicare Other | Source: Ambulatory Visit | Attending: Radiation Oncology | Admitting: Radiation Oncology

## 2012-09-24 NOTE — Progress Notes (Signed)
Patient stated "lortab Elixir is helping his comfort/pain", and informed him he can pick up rest of rx at his pharmacy today,, continue to take carafate as well,  asked Dr.Kinard if he still needed to see patient since Dr.Murray saw him Tuesday 09/22/12 and the lortab elixir is helping his pain , ,pt infomed no need to see MD today 2:21 PM

## 2012-09-24 NOTE — Telephone Encounter (Signed)
Stephanie,pharmacist from [facility] [phone] called to clarify Lortab elixir 7.5/500mg  , that was written by Dr.Murray on  Tuesday afternoon;they no longer carry the 7.5/500mg   Asked if oay to dispense the 7.5/325mg   Rest of rx, ,informed Stepanie okay per Dr.Kinard.now

## 2012-09-24 NOTE — Assessment & Plan Note (Signed)
Switched to rivaroxaban and using the reduced clearance dose for now.

## 2012-09-24 NOTE — Assessment & Plan Note (Signed)
Getting radiation therapy at present.

## 2012-09-24 NOTE — Assessment & Plan Note (Signed)
No current angina type symptoms.

## 2012-09-25 ENCOUNTER — Ambulatory Visit: Payer: Medicare Other

## 2012-09-25 ENCOUNTER — Ambulatory Visit
Admission: RE | Admit: 2012-09-25 | Discharge: 2012-09-25 | Disposition: A | Discharge status: Home / Self Care | Payer: Medicare Other | Source: Ambulatory Visit | Attending: Radiation Oncology | Admitting: Radiation Oncology

## 2012-09-28 ENCOUNTER — Ambulatory Visit: Payer: Medicare Other

## 2012-09-28 ENCOUNTER — Ambulatory Visit
Admission: RE | Admit: 2012-09-28 | Discharge: 2012-09-28 | Disposition: A | Discharge status: Home / Self Care | Payer: Medicare Other | Source: Ambulatory Visit | Attending: Radiation Oncology | Admitting: Radiation Oncology

## 2012-09-29 ENCOUNTER — Other Ambulatory Visit (HOSPITAL_BASED_OUTPATIENT_CLINIC_OR_DEPARTMENT_OTHER): Payer: Medicare Other | Admitting: Lab

## 2012-09-29 ENCOUNTER — Ambulatory Visit
Admission: RE | Admit: 2012-09-29 | Discharge: 2012-09-29 | Disposition: A | Discharge status: Home / Self Care | Payer: Medicare Other | Source: Ambulatory Visit | Attending: Radiation Oncology | Admitting: Radiation Oncology

## 2012-09-29 ENCOUNTER — Ambulatory Visit: Payer: Medicare Other

## 2012-09-29 VITALS — BP 115/62 | HR 84 | Temp 97.3°F | Wt 204.1 lb

## 2012-09-29 DIAGNOSIS — C3400 Malignant neoplasm of unspecified main bronchus: Secondary | ICD-10-CM

## 2012-09-29 DIAGNOSIS — C3490 Malignant neoplasm of unspecified part of unspecified bronchus or lung: Secondary | ICD-10-CM

## 2012-09-29 LAB — CBC WITH DIFFERENTIAL/PLATELET
BASO%: 0.4 % (ref 0.0–2.0)
Basophils Absolute: 0 10*3/uL (ref 0.0–0.1)
HCT: 27.2 % — ABNORMAL LOW (ref 38.4–49.9)
LYMPH%: 14.9 % (ref 14.0–49.0)
MCHC: 33.8 g/dL (ref 32.0–36.0)
MONO#: 0.6 10*3/uL (ref 0.1–0.9)
NEUT%: 75.3 % — ABNORMAL HIGH (ref 39.0–75.0)
Platelets: 21 10*3/uL — ABNORMAL LOW (ref 140–400)
WBC: 7.6 10*3/uL (ref 4.0–10.3)

## 2012-09-29 LAB — COMPREHENSIVE METABOLIC PANEL (CC13)
ALT: 29 U/L (ref 0–55)
BUN: 30 mg/dL — ABNORMAL HIGH (ref 7.0–26.0)
CO2: 22 mEq/L (ref 22–29)
Creatinine: 1.3 mg/dL (ref 0.7–1.3)
Glucose: 93 mg/dl (ref 70–99)
Total Bilirubin: 0.39 mg/dL (ref 0.20–1.20)

## 2012-09-29 NOTE — Progress Notes (Signed)
[facility]    Radiation Oncology [address] [place]     Maryln Gottron, M.D. [place], [place] [address]               Billie Lade, M.D., Ph.D. Phone: [phone]      Molli Hazard A. Kathrynn Running, M.D. Fax: [phone]      Radene Gunning, M.D., Ph.D.         Lurline Hare, M.D.         Grayland Jack, M.D Weekly Treatment Management Note  Name: Shawn [name]     MRN: [id number]        CSN: [id number] Date: 09/29/2012      DOB: Mar 13, 1928  CC: Shawn Meo, MD         Shawn [name]    Status: Outpatient  Diagnosis: The encounter diagnosis was Poorly differentiated lung carcinoma, favoring extensive stage small cell lung cancer - Nov 2013, ECOG 1.  Current Dose: 39.6 Gy  Current Fraction: 22  Planned Dose: 59.4 Gy  Narrative: Shawn [name] was seen today for weekly treatment management. The chart was checked and CBCT  were reviewed. He is having some esophageal symptoms but is not taking his Lortab elixir and Carafate on a regular basis he does feel his throat becomes more sore after the radiation treatments.  He did meet with the nutritionist recommended supplements in light of his poor appetite. I recommend he discuss  appetite stimulants with Dr. Arbutus Ped when he sees him next week.  I recommended patient take his Lortab and Carafate on a regular basis.  Review of patient's allergies indicates no known allergies.  Current Outpatient Prescriptions  Medication Sig Dispense Refill  . alum & mag hydroxide-simeth (MAALOX/MYLANTA) 200-200-20 MG/5ML suspension Take 30 mLs by mouth every 6 (six) hours as needed. For indigestion      . aspirin 81 MG tablet Take 1 tablet (81 mg total) by mouth every other day.  30 tablet  1  . furosemide (LASIX) 20 MG tablet Take 20 mg by mouth daily.      Marland Kitchen glipiZIDE (GLUCOTROL) 10 MG tablet Take 10 mg by mouth daily.        Marland Kitchen HYDROcodone-acetaminophen (LORTAB) 7.5-500 MG/15ML solution Take 15 mLs by mouth every 6 (six) hours as needed for  pain.  480 mL  0  . levothyroxine (SYNTHROID, LEVOTHROID) 75 MCG tablet Take 75 mcg by mouth daily.      Marland Kitchen lidocaine-prilocaine (EMLA) cream Apply topically as needed. Apply to port 1 hour before chemotherapy, cover with plastic dressing.  30 g  0  . Linaclotide (LINZESS) 145 MCG CAPS Take 145 mcg by mouth every other day.      . lisinopril (PRINIVIL,ZESTRIL) 10 MG tablet Take 10 mg by mouth daily.      . Multiple Vitamin (MULTIVITAMIN) tablet Take 1 tablet by mouth daily.        . nitroGLYCERIN (NITROSTAT) 0.4 MG SL tablet Place 0.4 mg under the tongue every 5 (five) minutes as needed. For chest pain      . potassium chloride (K-DUR,KLOR-CON) 10 MEQ tablet Take 10 mEq by mouth daily.      . prochlorperazine (COMPAZINE) 10 MG tablet Take 10 mg by mouth every 6 (six) hours as needed.      . Rivaroxaban (XARELTO) 15 MG TABS tablet Take 1 tablet (15 mg total) by mouth daily.  30 tablet  1  . rosuvastatin (CRESTOR) 10 MG tablet Take 10 mg by mouth  daily.      . sucralfate (CARAFATE) 1 GM/10ML suspension Take 10 mLs (1 g total) by mouth 4 (four) times daily.  420 mL  0  . [DISCONTINUED] rivastigmine (EXELON) 4.6 mg/24hr Place 1 patch onto the skin daily.       Labs:  Lab Results  Component Value Date   WBC 7.6 09/29/2012   HGB 9.2* 09/29/2012   HCT 27.2* 09/29/2012   MCV 85.5 09/29/2012   PLT 21* 09/29/2012   Lab Results  Component Value Date   CREATININE 1.3 09/29/2012   BUN 30.0* 09/29/2012   NA 139 09/29/2012   K 4.0 09/29/2012   CL 110* 09/29/2012   CO2 22 09/29/2012   Lab Results  Component Value Date   ALT 29 09/29/2012   AST 21 09/29/2012   PHOS 4.1 05/29/2009   BILITOT 0.39 09/29/2012    Physical Examination:  weight is 204 lb 1.6 oz (92.579 kg). His temperature is 97.3 F (36.3 C). His blood pressure is 115/62 and his pulse is 84. His oxygen saturation is 96%.    Wt Readings from Last 3 Encounters:  09/29/12 204 lb 1.6 oz (92.579 kg)  09/15/12 204 lb 12.8 oz (92.897 kg)  09/10/12 203 lb 1.9 oz  (92.135 kg)    The oral cavity is moist without secondary infection Lungs - Normal respiratory effort, chest expands symmetrically. Lungs are clear to auscultation, no crackles or wheezes.  Heart has regular rhythm and rate  Abdomen is soft and non tender with normal bowel sounds  Assessment:  Patient tolerating treatments well  Plan: Continue treatment per original radiation prescription

## 2012-09-29 NOTE — Progress Notes (Signed)
Patient here for routine weekly assessment of chest radiation."feels bad".Appetite suppressed and difficulty swallowing due to throat pain.Not really taking carafate as not eating much.Has had approximately 5 lb wt. Loss in last 2 weeks and 10 lbs overall.Intermittent nausea.Completed 22 of 33 radiation treatments.

## 2012-09-30 ENCOUNTER — Ambulatory Visit (HOSPITAL_COMMUNITY)
Admission: RE | Admit: 2012-09-30 | Discharge: 2012-09-30 | Disposition: A | Discharge status: Home / Self Care | Payer: Medicare Other | Source: Ambulatory Visit | Attending: Physician Assistant | Admitting: Physician Assistant

## 2012-09-30 ENCOUNTER — Ambulatory Visit: Payer: Medicare Other

## 2012-09-30 ENCOUNTER — Ambulatory Visit
Admission: RE | Admit: 2012-09-30 | Discharge: 2012-09-30 | Disposition: A | Discharge status: Home / Self Care | Payer: Medicare Other | Source: Ambulatory Visit | Attending: Radiation Oncology | Admitting: Radiation Oncology

## 2012-09-30 DIAGNOSIS — C3490 Malignant neoplasm of unspecified part of unspecified bronchus or lung: Secondary | ICD-10-CM | POA: Insufficient documentation

## 2012-09-30 MED ORDER — IOHEXOL 300 MG/ML  SOLN
80.0000 mL | Freq: Once | INTRAMUSCULAR | Status: AC | PRN
  Administered 2012-09-30: 80 mL via INTRAVENOUS

## 2012-10-01 ENCOUNTER — Ambulatory Visit: Payer: Medicare Other

## 2012-10-01 ENCOUNTER — Ambulatory Visit
Admission: RE | Admit: 2012-10-01 | Discharge: 2012-10-01 | Disposition: A | Discharge status: Home / Self Care | Payer: Medicare Other | Source: Ambulatory Visit | Attending: Radiation Oncology | Admitting: Radiation Oncology

## 2012-10-02 ENCOUNTER — Ambulatory Visit
Admission: RE | Admit: 2012-10-02 | Discharge: 2012-10-02 | Disposition: A | Discharge status: Home / Self Care | Payer: Medicare Other | Source: Ambulatory Visit | Attending: Radiation Oncology | Admitting: Radiation Oncology

## 2012-10-02 ENCOUNTER — Ambulatory Visit: Payer: Medicare Other

## 2012-10-02 ENCOUNTER — Ambulatory Visit (HOSPITAL_COMMUNITY): Payer: Medicare Other

## 2012-10-05 ENCOUNTER — Ambulatory Visit
Admission: RE | Admit: 2012-10-05 | Discharge: 2012-10-05 | Disposition: A | Discharge status: Home / Self Care | Payer: Medicare Other | Source: Ambulatory Visit | Attending: Radiation Oncology | Admitting: Radiation Oncology

## 2012-10-05 ENCOUNTER — Ambulatory Visit: Payer: Medicare Other

## 2012-10-06 ENCOUNTER — Ambulatory Visit: Payer: Medicare Other

## 2012-10-06 ENCOUNTER — Other Ambulatory Visit: Payer: Medicare Other | Admitting: Lab

## 2012-10-06 ENCOUNTER — Encounter: Payer: Self-pay | Admitting: Internal Medicine

## 2012-10-06 ENCOUNTER — Ambulatory Visit: Payer: Medicare Other | Admitting: Nutrition

## 2012-10-06 ENCOUNTER — Ambulatory Visit (HOSPITAL_BASED_OUTPATIENT_CLINIC_OR_DEPARTMENT_OTHER): Payer: Medicare Other | Admitting: Internal Medicine

## 2012-10-06 ENCOUNTER — Telehealth: Payer: Self-pay | Admitting: *Deleted

## 2012-10-06 ENCOUNTER — Telehealth: Payer: Self-pay | Admitting: Internal Medicine

## 2012-10-06 ENCOUNTER — Other Ambulatory Visit (HOSPITAL_BASED_OUTPATIENT_CLINIC_OR_DEPARTMENT_OTHER): Payer: Medicare Other | Admitting: Lab

## 2012-10-06 VITALS — BP 129/72 | HR 113 | Temp 96.8°F | Resp 20 | Ht 71.0 in | Wt 193.4 lb

## 2012-10-06 DIAGNOSIS — C3400 Malignant neoplasm of unspecified main bronchus: Secondary | ICD-10-CM

## 2012-10-06 DIAGNOSIS — C3490 Malignant neoplasm of unspecified part of unspecified bronchus or lung: Secondary | ICD-10-CM

## 2012-10-06 LAB — CBC WITH DIFFERENTIAL/PLATELET
BASO%: 0.2 % (ref 0.0–2.0)
EOS%: 0.8 % (ref 0.0–7.0)
LYMPH%: 12.8 % — ABNORMAL LOW (ref 14.0–49.0)
MCHC: 33.9 g/dL (ref 32.0–36.0)
MCV: 83.4 fL (ref 79.3–98.0)
MONO%: 10.3 % (ref 0.0–14.0)
NEUT#: 7.3 10*3/uL — ABNORMAL HIGH (ref 1.5–6.5)
Platelets: 188 10*3/uL (ref 140–400)
RBC: 3.61 10*6/uL — ABNORMAL LOW (ref 4.20–5.82)
RDW: 14.7 % — ABNORMAL HIGH (ref 11.0–14.6)
nRBC: 0 % (ref 0–0)

## 2012-10-06 MED ORDER — METHYLPREDNISOLONE 4 MG PO KIT: PACK | ORAL | Status: DC

## 2012-10-06 MED ORDER — PROCHLORPERAZINE MALEATE 10 MG PO TABS: 10.0000 mg | ORAL_TABLET | Freq: Four times a day (QID) | ORAL | Status: DC | PRN

## 2012-10-06 NOTE — Telephone Encounter (Signed)
Gave pt appt for January and February lab 2014, MD and chemo

## 2012-10-06 NOTE — Patient Instructions (Signed)
You had significant improvement in her disease on the recent scan. Continue chemotherapy as scheduled. Followup in 4 weeks.

## 2012-10-06 NOTE — Telephone Encounter (Signed)
Per staff phone call and POF I have schedueld appts.  JMW  

## 2012-10-06 NOTE — Progress Notes (Signed)
Patient's treatment was cancelled and therefore, nutrition follow up was not completed.  Follow up scheduled on Tuesday, Feb 11 during chemotherapy.

## 2012-10-06 NOTE — Progress Notes (Signed)
[facility] [phone]   [phone]) [phone]  OFFICE PROGRESS NOTE  Minda Meo, MD [address] [facility], [place]. [place] [address]  DIAGNOSIS: Extensive stage small cell lung cancer with questionable small metastatic focus in the posterior left temporal/occipital lobe Junction of the brain diagnosed in November of 2013.   PRIOR THERAPY: None   CURRENT THERAPY: Systemic chemotherapy with carboplatin for an AUC of 5 given on day 1 and etoposide at 100 mg per meter squared given on days 1, 2 and 3 with Neulasta support given on day 4 status post 2 cycles. This will be given concurrent with radiotherapy under the care of Dr. Roselind Messier  INTERVAL HISTORY: Shawn [name] 79 y.o. male returns to the clinic today for followup visit accompanied his 2 sons and daughter. The patient is feeling fine today with no specific complaints except for mild fatigue. He tolerated the last 2 cycles of systemic chemotherapy with carboplatin and etoposide fairly well concurrent with radiation. He denied having any significant chest pain but continues to have shortness breath with exertion, no cough or hemoptysis. The patient lost around 14 pounds since he started his treatment. He had repeat CT scan of the chest performed recently and he is here for evaluation and discussion of his scan results.  MEDICAL HISTORY: Past Medical History  Diagnosis Date  . CAD (coronary artery disease)     a. 1990 CABGx3: LIMA->LAD, VG->OM, VG->dRCA;  b. 05/2007 VF Arrest/Cath/PCI: 100 VG->OM tx w/ BMS, 95 VG->RCA tx w/ bms;  c. 06/2007 90 Native PDA tx w DES ;  d. 05/2009 Echo EF 60-65%, nl WM, mild MR; e. normal lexiscan myoview 03/30/12  . HTN (hypertension)   . Hypercholesteremia   . Atrial fibrillation     a. had been on tikosyn -> d/c 09/2010;  b. Was on Amio ->d/c 09/2011;  c.  anticoagulated w/ Pradaxa  . Junctional ectopic tachycardia     a. noted 09/2010  . Sinus  bradycardia   . Back pain     a. Followed by [facility] - on prednisone  . Prostate cancer   . Unsteady gait     a. in rehab for core strengthening  . Colitis   . Tubular adenoma of colon 02/2007  . Myocardial infarction   . Arthritis   . CHF (congestive heart failure)   . Depression   . Internal hemorrhoids without mention of complication   . Diverticulosis of colon (without mention of hemorrhage)   . Diabetes mellitus     Type 2 NIDDM x 15 years  . CKD (chronic kidney disease), stage III     pt denies CKD  . GERD (gastroesophageal reflux disease)     ALLERGIES:   has no known allergies.  MEDICATIONS:  Current Outpatient Prescriptions  Medication Sig Dispense Refill  . alum & mag hydroxide-simeth (MAALOX/MYLANTA) 200-200-20 MG/5ML suspension Take 30 mLs by mouth every 6 (six) hours as needed. For indigestion      . aspirin 81 MG tablet Take 1 tablet (81 mg total) by mouth every other day.  30 tablet  1  . furosemide (LASIX) 20 MG tablet Take 20 mg by mouth daily.      Marland Kitchen glipiZIDE (GLUCOTROL) 10 MG tablet Take 10 mg by mouth daily.        Marland Kitchen HYDROcodone-acetaminophen (LORTAB) 7.5-500 MG/15ML solution Take 15 mLs by mouth every 6 (six) hours as needed for pain.  480 mL  0  .  levothyroxine (SYNTHROID, LEVOTHROID) 75 MCG tablet Take 75 mcg by mouth daily.      Marland Kitchen lidocaine-prilocaine (EMLA) cream Apply topically as needed. Apply to port 1 hour before chemotherapy, cover with plastic dressing.  30 g  0  . Linaclotide (LINZESS) 145 MCG CAPS Take 145 mcg by mouth every other day.      . lisinopril (PRINIVIL,ZESTRIL) 10 MG tablet Take 10 mg by mouth daily.      . Multiple Vitamin (MULTIVITAMIN) tablet Take 1 tablet by mouth daily.        . nitroGLYCERIN (NITROSTAT) 0.4 MG SL tablet Place 0.4 mg under the tongue every 5 (five) minutes as needed. For chest pain      . potassium chloride (K-DUR,KLOR-CON) 10 MEQ tablet Take 10 mEq by mouth daily.      . prochlorperazine (COMPAZINE) 10 MG tablet Take  10 mg by mouth every 6 (six) hours as needed.      . Rivaroxaban (XARELTO) 15 MG TABS tablet Take 1 tablet (15 mg total) by mouth daily.  30 tablet  1  . rosuvastatin (CRESTOR) 10 MG tablet Take 10 mg by mouth daily.      . sucralfate (CARAFATE) 1 GM/10ML suspension Take 10 mLs (1 g total) by mouth 4 (four) times daily.  420 mL  0  . [DISCONTINUED] rivastigmine (EXELON) 4.6 mg/24hr Place 1 patch onto the skin daily.        SURGICAL HISTORY:  Past Surgical History  Procedure Date  . Coronary artery bypass graft 1990  . Prostate surgery 01/1992    retropubic prostatectomy  . Cardiac valve surgery   . Coronary angioplasty with stent placement 2008    x 3   . Rt finger trigger a-1 pulley   . Cholecystectomy 05/05/12  . Cholecystectomy 05/05/2012    Procedure: LAPAROSCOPIC CHOLECYSTECTOMY WITH INTRAOPERATIVE CHOLANGIOGRAM;  Surgeon: Mariella Saa, MD;  Location: [facility] OR;  Service: General;  Laterality: N/A;  . Endobronchial ultrasound 08/04/2012    Procedure: ENDOBRONCHIAL ULTRASOUND;  Surgeon: Kalman Shan, MD;  Location: [facility] OR;  Service: Cardiopulmonary;  Laterality: N/A;  . Video bronchoscopy 08/04/2012    Procedure: VIDEO BRONCHOSCOPY WITH FLUORO;  Surgeon: Kalman Shan, MD;  Location: [facility] OR;  Service: Cardiopulmonary;  Laterality: N/A;    REVIEW OF SYSTEMS:  A comprehensive review of systems was negative except for: Constitutional: positive for anorexia, fatigue and weight loss Respiratory: positive for dyspnea on exertion Musculoskeletal: positive for muscle weakness   PHYSICAL EXAMINATION: General appearance: alert, cooperative and no distress Head: Normocephalic, without obvious abnormality, atraumatic Neck: no adenopathy Lymph nodes: Cervical, supraclavicular, and axillary nodes normal. Resp: clear to auscultation bilaterally Back: symmetric, no curvature. ROM normal. No CVA tenderness. Cardio: regular rate and rhythm, S1, S2 normal, no murmur, click, rub or  gallop GI: soft, non-tender; bowel sounds normal; no masses,  no organomegaly Extremities: extremities normal, atraumatic, no cyanosis or edema Neurologic: Alert and oriented X 3, normal strength and tone. Normal symmetric reflexes. Normal coordination and gait  ECOG PERFORMANCE STATUS: 1 - Symptomatic but completely ambulatory  Blood pressure 129/72, pulse 113, temperature 96.8 F (36 C), temperature source Oral, resp. rate 20, height 5\' 11"  (1.803 m), weight 193 lb 6.4 oz (87.726 kg).  LABORATORY DATA: Lab Results  Component Value Date   WBC 9.7 10/06/2012   HGB 10.2* 10/06/2012   HCT 30.1* 10/06/2012   MCV 83.4 10/06/2012   PLT 188 10/06/2012      Chemistry  Component Value Date/Time   NA 139 09/29/2012 1300   NA 137 07/29/2012 1207   K 4.0 09/29/2012 1300   K 4.2 07/29/2012 1207   CL 110* 09/29/2012 1300   CL 103 07/29/2012 1207   CO2 22 09/29/2012 1300   CO2 23 07/29/2012 1207   BUN 30.0* 09/29/2012 1300   BUN 20 07/29/2012 1207   CREATININE 1.3 09/29/2012 1300   CREATININE 1.23 07/29/2012 1207   CREATININE 1.48* 05/11/2012 1030      Component Value Date/Time   CALCIUM 9.3 09/29/2012 1300   CALCIUM 9.3 07/29/2012 1207   ALKPHOS 125 09/29/2012 1300   ALKPHOS 117 07/29/2012 1207   AST 21 09/29/2012 1300   AST 35 07/29/2012 1207   ALT 29 09/29/2012 1300   ALT 29 07/29/2012 1207   BILITOT 0.39 09/29/2012 1300   BILITOT 0.8 07/29/2012 1207       RADIOGRAPHIC STUDIES: Ct Chest W Contrast  09/30/2012  *RADIOLOGY REPORT*  Clinical Data: Lung cancer with ongoing chemotherapy and radiation therapy.  History of prostate cancer status post prostatectomy.  CT CHEST WITH CONTRAST  Technique:  Multidetector CT imaging of the chest was performed following the standard protocol during bolus administration of intravenous contrast.  Contrast: 80mL OMNIPAQUE IOHEXOL 300 MG/ML  SOLN  Comparison: PET CT from 06/29/2012  Findings: There is no axillary lymphadenopathy.  No mediastinal or hilar lymphadenopathy.  The  patient is status post CABG no pericardial or pleural effusion.  Lung windows show changes of emphysema.  The left suprahilar region has decreased in size in the interval. Measuring at the same level and in the same dimensions as the previous study, the left suprahilar lesion has decreased from 2.3 x 1.7 cm on the previous study to 2.0 x 1.0 cm on the current exam.  The peripheral 13 x 13 mm focus of ground-glass attenuation in the posterolateral left upper lobe has nearly resolved.  A new 13 x 13 mm ground-glass opacities seen in the posterior right lower lobe (image 39).  Bone windows reveal no worrisome lytic or sclerotic osseous lesions.  IMPRESSION: Interval decrease in size of the left suprahilar lesion with near complete resolution of the previously identified ground-glass attenuation in the posterior left upper lobe.  New 13 mm focus of ground-glass density in the posterior right lower lobe. This is probably related to post infectious or post inflammatory alveolitis.  Continued attention on follow-up imaging recommended.   Original Report Authenticated By: Kennith Center, M.D.      ASSESSMENT: This is a very pleasant 79 years old white male with extensive stage small cell lung cancer secondary to presence of questionable brain metastasis. The patient is currently undergoing systemic chemotherapy with carboplatin and etoposide status post 2 cycles concurrent with radiation. He had significant improvement in his disease.   PLAN: I discussed the scan results with the patient and his family today. I recommended for him to continue with systemic chemotherapy with carboplatin and etoposide for 2 more cycles before repeating staging scan. The patient has been with his family for this week and he would like to delay the start of cycle #3 until next week. He would come back for followup visit in 4 weeks with the start of cycle #4. He was advised to call immediately if he has any concerning symptoms in the  interval.  All questions were answered. The patient knows to call the clinic with any problems, questions or concerns. We can certainly see the patient much sooner if  necessary.  I spent 15 minutes counseling the patient face to face. The total time spent in the appointment was 25 minutes.

## 2012-10-07 ENCOUNTER — Ambulatory Visit: Payer: Medicare Other

## 2012-10-07 ENCOUNTER — Ambulatory Visit
Admission: RE | Admit: 2012-10-07 | Discharge: 2012-10-07 | Disposition: A | Discharge status: Home / Self Care | Payer: Medicare Other | Source: Ambulatory Visit | Attending: Radiation Oncology | Admitting: Radiation Oncology

## 2012-10-07 ENCOUNTER — Encounter: Payer: Self-pay | Admitting: Radiation Oncology

## 2012-10-07 VITALS — BP 102/56 | HR 102 | Temp 97.5°F | Resp 20 | Wt 193.6 lb

## 2012-10-07 DIAGNOSIS — C3490 Malignant neoplasm of unspecified part of unspecified bronchus or lung: Secondary | ICD-10-CM

## 2012-10-07 NOTE — Progress Notes (Addendum)
Pt reports loss of appetite, fatigue, painful swallowing on right side, dry cough. He states Carafate is helpful. Pt drinks 3 nutritional supplements daily. He states he is on steroid to increase his appetite. Pt states he "cannot believe he weighed 204 last week." He states "it must have been my clothes". Pt reports weakness of legs due to inactivity. Skin in tx area w/o hyperpigmentation.

## 2012-10-07 NOTE — Progress Notes (Signed)
[facility]    Radiation Oncology [address] [place]     Maryln Gottron, M.D. [place], [place] [address]               Billie Lade, M.D., Ph.D. Phone: [phone]      Molli Hazard A. Kathrynn Running, M.D. Fax: [phone]      Radene Gunning, M.D., Ph.D.         Lurline Hare, M.D.         Grayland Jack, M.D Weekly Treatment Management Note  Name: Shawn [name]     MRN: [id number]        CSN: [id number] Date: 10/07/2012      DOB: 1927/10/05  CC: Minda Meo, MD         Jacky Kindle    Status: Outpatient  Diagnosis: The encounter diagnosis was Poorly differentiated lung carcinoma, favoring extensive stage small cell lung cancer - Nov 2013, ECOG 1.  Current Dose: 48.6 Gy  Current Fraction: 27  Planned Dose: 59.4 Gy  Narrative: Shawn [name] was seen today for weekly treatment management. The chart was checked and CBCT  were reviewed. He is tolerating his treatments reasonably well. He has minimal esophageal symptoms. He does have some fatigue but denies any cough or breathing problems.  Review of patient's allergies indicates no known allergies.  Current Outpatient Prescriptions  Medication Sig Dispense Refill  . alum & mag hydroxide-simeth (MAALOX/MYLANTA) 200-200-20 MG/5ML suspension Take 30 mLs by mouth every 6 (six) hours as needed. For indigestion      . aspirin 81 MG tablet Take 1 tablet (81 mg total) by mouth every other day.  30 tablet  1  . furosemide (LASIX) 20 MG tablet Take 20 mg by mouth daily.      Marland Kitchen glipiZIDE (GLUCOTROL) 10 MG tablet Take 10 mg by mouth daily.        Marland Kitchen HYDROcodone-acetaminophen (LORTAB) 7.5-500 MG/15ML solution Take 15 mLs by mouth every 6 (six) hours as needed for pain.  480 mL  0  . levothyroxine (SYNTHROID, LEVOTHROID) 75 MCG tablet Take 75 mcg by mouth daily.      Marland Kitchen lidocaine-prilocaine (EMLA) cream Apply topically as needed. Apply to port 1 hour before chemotherapy, cover with plastic dressing.  30 g  0  . Linaclotide (LINZESS) 145  MCG CAPS Take 145 mcg by mouth every other day.      . lisinopril (PRINIVIL,ZESTRIL) 10 MG tablet Take 10 mg by mouth daily.      . methylPREDNISolone (MEDROL, PAK,) 4 MG tablet follow package directions  21 tablet  0  . Multiple Vitamin (MULTIVITAMIN) tablet Take 1 tablet by mouth daily.        . nitroGLYCERIN (NITROSTAT) 0.4 MG SL tablet Place 0.4 mg under the tongue every 5 (five) minutes as needed. For chest pain      . potassium chloride (K-DUR,KLOR-CON) 10 MEQ tablet Take 10 mEq by mouth daily.      . prochlorperazine (COMPAZINE) 10 MG tablet Take 1 tablet (10 mg total) by mouth every 6 (six) hours as needed.  30 tablet  1  . Rivaroxaban (XARELTO) 15 MG TABS tablet Take 1 tablet (15 mg total) by mouth daily.  30 tablet  1  . rosuvastatin (CRESTOR) 10 MG tablet Take 10 mg by mouth daily.      . sucralfate (CARAFATE) 1 GM/10ML suspension Take 10 mLs (1 g total) by mouth 4 (four) times daily.  420 mL  0  . [  DISCONTINUED] rivastigmine (EXELON) 4.6 mg/24hr Place 1 patch onto the skin daily.       Labs:  Lab Results  Component Value Date   WBC 9.7 10/06/2012   HGB 10.2* 10/06/2012   HCT 30.1* 10/06/2012   MCV 83.4 10/06/2012   PLT 188 10/06/2012   Lab Results  Component Value Date   CREATININE 1.3 09/29/2012   BUN 30.0* 09/29/2012   NA 139 09/29/2012   K 4.0 09/29/2012   CL 110* 09/29/2012   CO2 22 09/29/2012   Lab Results  Component Value Date   ALT 29 09/29/2012   AST 21 09/29/2012   PHOS 4.1 05/29/2009   BILITOT 0.39 09/29/2012    Physical Examination:  weight is 193 lb 9.6 oz (87.816 kg). His oral temperature is 97.5 F (36.4 C). His blood pressure is 102/56 and his pulse is 102. His respiration is 20.    Wt Readings from Last 3 Encounters:  10/07/12 193 lb 9.6 oz (87.816 kg)  10/06/12 193 lb 6.4 oz (87.726 kg)  09/29/12 204 lb 1.6 oz (92.579 kg)    The oral cavity is moist without secondary infection Lungs - Normal respiratory effort, chest expands symmetrically. Lungs are clear to  auscultation, no crackles or wheezes.  Heart has regular rhythm and rate  Abdomen is soft and non tender with normal bowel sounds  Assessment:  Patient tolerating treatments well  Plan: Continue treatment per original radiation prescription

## 2012-10-08 ENCOUNTER — Other Ambulatory Visit: Payer: Self-pay

## 2012-10-08 ENCOUNTER — Ambulatory Visit: Payer: Medicare Other

## 2012-10-08 ENCOUNTER — Ambulatory Visit
Admission: RE | Admit: 2012-10-08 | Discharge: 2012-10-08 | Disposition: A | Discharge status: Home / Self Care | Payer: Medicare Other | Source: Ambulatory Visit | Attending: Radiation Oncology | Admitting: Radiation Oncology

## 2012-10-08 NOTE — Telephone Encounter (Signed)
Prescription refill for carafate and hydrocodone called into [facility] pharmacy per Dr.Kinard.Spoke with [employer].

## 2012-10-09 ENCOUNTER — Ambulatory Visit: Payer: Medicare Other

## 2012-10-09 ENCOUNTER — Ambulatory Visit
Admission: RE | Admit: 2012-10-09 | Discharge: 2012-10-09 | Disposition: A | Discharge status: Home / Self Care | Payer: Medicare Other | Source: Ambulatory Visit | Attending: Radiation Oncology | Admitting: Radiation Oncology

## 2012-10-12 ENCOUNTER — Ambulatory Visit
Admission: RE | Admit: 2012-10-12 | Discharge: 2012-10-12 | Disposition: A | Discharge status: Home / Self Care | Payer: Medicare Other | Source: Ambulatory Visit | Attending: Radiation Oncology | Admitting: Radiation Oncology

## 2012-10-13 ENCOUNTER — Ambulatory Visit (HOSPITAL_BASED_OUTPATIENT_CLINIC_OR_DEPARTMENT_OTHER): Payer: Medicare Other

## 2012-10-13 ENCOUNTER — Ambulatory Visit
Admission: RE | Admit: 2012-10-13 | Discharge: 2012-10-13 | Disposition: A | Discharge status: Home / Self Care | Payer: Medicare Other | Source: Ambulatory Visit | Attending: Radiation Oncology | Admitting: Radiation Oncology

## 2012-10-13 ENCOUNTER — Other Ambulatory Visit (HOSPITAL_BASED_OUTPATIENT_CLINIC_OR_DEPARTMENT_OTHER): Payer: Medicare Other

## 2012-10-13 ENCOUNTER — Encounter: Payer: Self-pay | Admitting: Radiation Oncology

## 2012-10-13 VITALS — BP 109/60 | HR 83

## 2012-10-13 VITALS — BP 109/60 | HR 89 | Resp 18 | Wt 192.0 lb

## 2012-10-13 DIAGNOSIS — Z5111 Encounter for antineoplastic chemotherapy: Secondary | ICD-10-CM

## 2012-10-13 DIAGNOSIS — C3490 Malignant neoplasm of unspecified part of unspecified bronchus or lung: Secondary | ICD-10-CM

## 2012-10-13 DIAGNOSIS — C3410 Malignant neoplasm of upper lobe, unspecified bronchus or lung: Secondary | ICD-10-CM

## 2012-10-13 LAB — CBC WITH DIFFERENTIAL/PLATELET
BASO%: 0.1 % (ref 0.0–2.0)
EOS%: 0.1 % (ref 0.0–7.0)
MCH: 28.9 pg (ref 27.2–33.4)
MCHC: 33.1 g/dL (ref 32.0–36.0)
MONO#: 0.9 10*3/uL (ref 0.1–0.9)
NEUT%: 85.6 % — ABNORMAL HIGH (ref 39.0–75.0)
RBC: 3.49 10*6/uL — ABNORMAL LOW (ref 4.20–5.82)
RDW: 19.4 % — ABNORMAL HIGH (ref 11.0–14.6)
WBC: 9.1 10*3/uL (ref 4.0–10.3)
lymph#: 0.4 10*3/uL — ABNORMAL LOW (ref 0.9–3.3)
nRBC: 0 % (ref 0–0)

## 2012-10-13 LAB — COMPREHENSIVE METABOLIC PANEL (CC13)
ALT: 18 U/L (ref 0–55)
AST: 18 U/L (ref 5–34)
Albumin: 3.2 g/dL — ABNORMAL LOW (ref 3.5–5.0)
Alkaline Phosphatase: 80 U/L (ref 40–150)
BUN: 28 mg/dL — ABNORMAL HIGH (ref 7.0–26.0)
Potassium: 4.6 mEq/L (ref 3.5–5.1)
Sodium: 137 mEq/L (ref 136–145)

## 2012-10-13 MED ORDER — HEPARIN SOD (PORK) LOCK FLUSH 100 UNIT/ML IV SOLN
500.0000 [IU] | Freq: Once | INTRAVENOUS | Status: AC | PRN
  Administered 2012-10-13: 500 [IU]
  Filled 2012-10-13: qty 5

## 2012-10-13 MED ORDER — SODIUM CHLORIDE 0.9 % IJ SOLN
10.0000 mL | INTRAMUSCULAR | Status: DC | PRN
  Administered 2012-10-13: 10 mL
  Filled 2012-10-13: qty 10

## 2012-10-13 MED ORDER — ONDANSETRON 16 MG/50ML IVPB (CHCC)
16.0000 mg | Freq: Once | INTRAVENOUS | Status: AC
  Administered 2012-10-13: 16 mg via INTRAVENOUS

## 2012-10-13 MED ORDER — SODIUM CHLORIDE 0.9 % IV SOLN
100.0000 mg/m2 | Freq: Once | INTRAVENOUS | Status: AC
  Administered 2012-10-13: 220 mg via INTRAVENOUS
  Filled 2012-10-13: qty 11

## 2012-10-13 MED ORDER — DEXAMETHASONE SODIUM PHOSPHATE 4 MG/ML IJ SOLN
20.0000 mg | Freq: Once | INTRAMUSCULAR | Status: AC
  Administered 2012-10-13: 20 mg via INTRAVENOUS

## 2012-10-13 MED ORDER — SODIUM CHLORIDE 0.9 % IV SOLN
Freq: Once | INTRAVENOUS | Status: AC
  Administered 2012-10-13: 13:00:00 via INTRAVENOUS

## 2012-10-13 MED ORDER — SODIUM CHLORIDE 0.9 % IV SOLN
420.5000 mg | Freq: Once | INTRAVENOUS | Status: AC
  Administered 2012-10-13: 420 mg via INTRAVENOUS
  Filled 2012-10-13: qty 42

## 2012-10-13 NOTE — Progress Notes (Signed)
Patient presents to the clinic today accompanied by his son for PUT with Dr. Roselind Messier. Patient alert and oriented to person, place, and time. No distress noted. Steady gait noted. Pleasant affect noted. Patient denies pain at this time. Hoarseness noted. Patient reports painful swallowing. Patient reports mostly a dry cough but, occasionally productive with yellow sputum. Patient reports dry mouth. Patient reports decreased appetite but, denies taste changes. Patient reports using hydrocodone at night before bed to relieve throat pain and carafate before meals. Patient denies skin changes to treatment field. Patient weight stable. 10/07/2012 193.9 lb today 192 lb. Reported all findings to Dr. Roselind Messier.

## 2012-10-13 NOTE — Progress Notes (Signed)
[facility]    Radiation Oncology [address] [place]     Maryln Gottron, M.D. [place], [place] [address]               Billie Lade, M.D., Ph.D. Phone: [phone]      Molli Hazard A. Kathrynn Running, M.D. Fax: [phone]      Radene Gunning, M.D., Ph.D.         Lurline Hare, M.D.         Grayland Jack, M.D Weekly Treatment Management Note  Name: Shawn [name]     MRN: [id number]        CSN: [id number] Date: 10/13/2012      DOB: 1928/07/02  CC: Minda Meo, MD         Jacky Kindle    Status: Outpatient  Diagnosis: The encounter diagnosis was Poorly differentiated lung carcinoma, favoring extensive stage small cell lung cancer - Nov 2013, ECOG 1.  Current Dose: 55.8 Gy  Current Fraction: 31/33  Planned Dose: 59.4 Gy  Narrative: Shawn [name] was seen today for weekly treatment management. The chart was checked and CBCT  were reviewed. Shawn [name] continues to tolerate the treatments quite well typically given his age. Shawn [name] denies any significant swallowing problems or pain with swallowing. His breathing is stable.  Chest CT scan on January 8 showed interval improvement with his radiation and chemotherapy.  Review of patient's allergies indicates no known allergies.  Current Outpatient Prescriptions  Medication Sig Dispense Refill  . alum & mag hydroxide-simeth (MAALOX/MYLANTA) 200-200-20 MG/5ML suspension Take 30 mLs by mouth every 6 (six) hours as needed. For indigestion      . aspirin 81 MG tablet Take 1 tablet (81 mg total) by mouth every other day.  30 tablet  1  . furosemide (LASIX) 20 MG tablet Take 20 mg by mouth daily.      Marland Kitchen glipiZIDE (GLUCOTROL) 10 MG tablet Take 10 mg by mouth daily.        Marland Kitchen HYDROcodone-acetaminophen (NORCO) 7.5-325 MG per tablet Take 1 tablet by mouth every 6 (six) hours as needed. 1 tablet q6h prn pain qty 60, no refills called into [facility], [address]      . levothyroxine (SYNTHROID, LEVOTHROID) 75 MCG tablet Take 75 mcg by mouth daily.        Marland Kitchen lidocaine-prilocaine (EMLA) cream Apply topically as needed. Apply to port 1 hour before chemotherapy, cover with plastic dressing.  30 g  0  . Linaclotide (LINZESS) 145 MCG CAPS Take 145 mcg by mouth every other day.      . lisinopril (PRINIVIL,ZESTRIL) 10 MG tablet Take 10 mg by mouth daily.      . methylPREDNISolone (MEDROL, PAK,) 4 MG tablet follow package directions  21 tablet  0  . Multiple Vitamin (MULTIVITAMIN) tablet Take 1 tablet by mouth daily.        . nitroGLYCERIN (NITROSTAT) 0.4 MG SL tablet Place 0.4 mg under the tongue every 5 (five) minutes as needed. For chest pain      . potassium chloride (K-DUR,KLOR-CON) 10 MEQ tablet Take 10 mEq by mouth daily.      . prochlorperazine (COMPAZINE) 10 MG tablet Take 1 tablet (10 mg total) by mouth every 6 (six) hours as needed.  30 tablet  1  . Rivaroxaban (XARELTO) 15 MG TABS tablet Take 1 tablet (15 mg total) by mouth daily.  30 tablet  1  . rosuvastatin (CRESTOR) 10 MG tablet Take 10 mg by mouth  daily.      . sucralfate (CARAFATE) 1 GM/10ML suspension Take 1 g by mouth 4 (four) times daily. Refill called into [facility] [address] To [facility] End[facility] [phone] directions unchanged with one refill      . [DISCONTINUED] rivastigmine (EXELON) 4.6 mg/24hr Place 1 patch onto the skin daily.       No current facility-administered medications for this encounter.   Facility-Administered Medications Ordered in Other Encounters  Medication Dose Route Frequency Provider Last Rate Last Dose  . sodium chloride 0.9 % injection 10 mL  10 mL Intracatheter PRN Si Gaul, MD   10 mL at 10/13/12 1705   Labs:  Lab Results  Component Value Date   WBC 9.1 10/13/2012   HGB 10.1* 10/13/2012   HCT 30.5* 10/13/2012   MCV 87.4 10/13/2012   PLT 490* 10/13/2012   Lab Results  Component Value Date   CREATININE 1.4* 10/13/2012   BUN 28.0* 10/13/2012   NA 137 10/13/2012   K 4.6 10/13/2012   CL 102 10/13/2012   CO2 25 10/13/2012   Lab Results  Component  Value Date   ALT 18 10/13/2012   AST 18 10/13/2012   PHOS 4.1 05/29/2009   BILITOT 0.41 10/13/2012    Physical Examination:  weight is 192 lb (87.091 kg). His blood pressure is 109/60 and his pulse is 89. His respiration is 18.    Wt Readings from Last 3 Encounters:  10/13/12 192 lb (87.091 kg)  10/07/12 193 lb 9.6 oz (87.816 kg)  10/06/12 193 lb 6.4 oz (87.726 kg)    The oral cavity is moist without secondary infection. Lungs - Normal respiratory effort, chest expands symmetrically. Lungs are clear to auscultation, no crackles or wheezes.  Heart has regular rhythm and rate  Abdomen is soft and non tender with normal bowel sounds  Assessment:  Patient tolerating treatments well  Plan: Continue treatment per original radiation prescription

## 2012-10-13 NOTE — Patient Instructions (Signed)
[facility] Discharge Instructions for Patients Receiving Chemotherapy  Today you received the following chemotherapy agents Carboplatin/VP 16 (Etoposide) To help prevent nausea and vomiting after your treatment, we encourage you to take your nausea medication as prescribed.  If you develop nausea and vomiting that is not controlled by your nausea medication, call the clinic. If it is after clinic hours your family physician or the after hours number for the clinic or go to the Emergency Department.   BELOW ARE SYMPTOMS THAT SHOULD BE REPORTED IMMEDIATELY:  *FEVER GREATER THAN 100.5 F  *CHILLS WITH OR WITHOUT FEVER  NAUSEA AND VOMITING THAT IS NOT CONTROLLED WITH YOUR NAUSEA MEDICATION  *UNUSUAL SHORTNESS OF BREATH  *UNUSUAL BRUISING OR BLEEDING  TENDERNESS IN MOUTH AND THROAT WITH OR WITHOUT PRESENCE OF ULCERS  *URINARY PROBLEMS  *BOWEL PROBLEMS  UNUSUAL RASH Items with * indicate a potential emergency and should be followed up as soon as possible.  One of the nurses will contact you 24 hours after your treatment. Please let the nurse know about any problems that you may have experienced. Feel free to call the clinic you have any questions or concerns. The clinic phone number is [phone].   I have been informed and understand all the instructions given to me. I know to contact the clinic, my physician, or go to the Emergency Department if any problems should occur. I do not have any questions at this time, but understand that I may call the clinic during office hours   should I have any questions or need assistance in obtaining follow up care.    __________________________________________  _____________  __________ Signature of Patient or Authorized Representative            Date                   Time    __________________________________________ Nurse's Signature

## 2012-10-14 ENCOUNTER — Ambulatory Visit: Payer: Medicare Other | Admitting: Nutrition

## 2012-10-14 ENCOUNTER — Ambulatory Visit: Payer: Medicare Other | Admitting: Internal Medicine

## 2012-10-14 ENCOUNTER — Encounter: Payer: Self-pay | Admitting: Internal Medicine

## 2012-10-14 ENCOUNTER — Ambulatory Visit
Admission: RE | Admit: 2012-10-14 | Discharge: 2012-10-14 | Disposition: A | Discharge status: Home / Self Care | Payer: Medicare Other | Source: Ambulatory Visit | Attending: Radiation Oncology | Admitting: Radiation Oncology

## 2012-10-14 ENCOUNTER — Ambulatory Visit (INDEPENDENT_AMBULATORY_CARE_PROVIDER_SITE_OTHER): Payer: Medicare Other | Admitting: Internal Medicine

## 2012-10-14 ENCOUNTER — Ambulatory Visit (HOSPITAL_BASED_OUTPATIENT_CLINIC_OR_DEPARTMENT_OTHER): Payer: Medicare Other

## 2012-10-14 VITALS — BP 102/62 | HR 63 | Temp 97.7°F | Ht 70.0 in | Wt 200.4 lb

## 2012-10-14 DIAGNOSIS — C3490 Malignant neoplasm of unspecified part of unspecified bronchus or lung: Secondary | ICD-10-CM

## 2012-10-14 DIAGNOSIS — C3400 Malignant neoplasm of unspecified main bronchus: Secondary | ICD-10-CM

## 2012-10-14 DIAGNOSIS — Z5111 Encounter for antineoplastic chemotherapy: Secondary | ICD-10-CM

## 2012-10-14 DIAGNOSIS — R5383 Other fatigue: Secondary | ICD-10-CM

## 2012-10-14 DIAGNOSIS — F329 Major depressive disorder, single episode, unspecified: Secondary | ICD-10-CM

## 2012-10-14 MED ORDER — PROCHLORPERAZINE EDISYLATE 5 MG/ML IJ SOLN: 10.0000 mg | Freq: Once | INTRAMUSCULAR | Status: DC

## 2012-10-14 MED ORDER — PROCHLORPERAZINE MALEATE 10 MG PO TABS
10.0000 mg | ORAL_TABLET | Freq: Once | ORAL | Status: AC
  Administered 2012-10-14: 10 mg via ORAL

## 2012-10-14 MED ORDER — HEPARIN SOD (PORK) LOCK FLUSH 100 UNIT/ML IV SOLN
500.0000 [IU] | Freq: Once | INTRAVENOUS | Status: AC | PRN
  Administered 2012-10-14: 500 [IU]
  Filled 2012-10-14: qty 5

## 2012-10-14 MED ORDER — ETOPOSIDE CHEMO INJECTION 1 GM/50ML
100.0000 mg/m2 | Freq: Once | INTRAVENOUS | Status: AC
  Administered 2012-10-14: 220 mg via INTRAVENOUS
  Filled 2012-10-14: qty 11

## 2012-10-14 MED ORDER — SODIUM CHLORIDE 0.9 % IV SOLN
Freq: Once | INTRAVENOUS | Status: AC
  Administered 2012-10-14: 11:00:00 via INTRAVENOUS

## 2012-10-14 MED ORDER — SODIUM CHLORIDE 0.9 % IJ SOLN
10.0000 mL | INTRAMUSCULAR | Status: DC | PRN
  Administered 2012-10-14: 10 mL
  Filled 2012-10-14: qty 10

## 2012-10-14 NOTE — Progress Notes (Signed)
Patient reports that he is doing well. He feels that his poor appetite and nausea and vomiting have improved. He continues to have painful swallowing, however, he also feels that this is better. His weight is relatively stable and was documented as 192 pounds on January 21.  This is a decrease from usual body weight. Patient has no nutrition concerns today.  Nutrition diagnosis: Food and nutrition related knowledge deficit improved.  Intervention: I educated patient on the importance of continuing smaller, more frequent meals with adequate calories and protein to promote weight maintenance. He is to continue oral nutrition supplements as needed.  Monitoring, evaluation, goals: Patient has been able to tolerate adequate calories and protein to promote weight maintenance.  Next visit: Tuesday, February 11th, during chemotherapy.

## 2012-10-14 NOTE — Patient Instructions (Addendum)
[facility] Discharge Instructions for Patients Receiving Chemotherapy  Today you received the following chemotherapy agents:  etoposide  To help prevent nausea and vomiting after your treatment, we encourage you to take your nausea medication.  Take it as often as prescribed.     If you develop nausea and vomiting that is not controlled by your nausea medication, call the clinic. If it is after clinic hours your family physician or the after hours number for the clinic or go to the Emergency Department.   BELOW ARE SYMPTOMS THAT SHOULD BE REPORTED IMMEDIATELY:  *FEVER GREATER THAN 100.5 F  *CHILLS WITH OR WITHOUT FEVER  NAUSEA AND VOMITING THAT IS NOT CONTROLLED WITH YOUR NAUSEA MEDICATION  *UNUSUAL SHORTNESS OF BREATH  *UNUSUAL BRUISING OR BLEEDING  TENDERNESS IN MOUTH AND THROAT WITH OR WITHOUT PRESENCE OF ULCERS  *URINARY PROBLEMS  *BOWEL PROBLEMS  UNUSUAL RASH Items with * indicate a potential emergency and should be followed up as soon as possible.  Feel free to call the clinic you have any questions or concerns. The clinic phone number is [phone].   I have been informed and understand all the instructions given to me. I know to contact the clinic, my physician, or go to the Emergency Department if any problems should occur. I do not have any questions at this time, but understand that I may call the clinic during office hours   should I have any questions or need assistance in obtaining follow up care.    __________________________________________  _____________  __________ Signature of Patient or Authorized Representative            Date                   Time    __________________________________________ Nurse's Signature    

## 2012-10-14 NOTE — Progress Notes (Signed)
Subjective:    Patient ID: Shawn [name], male    DOB: 05/28/1928, 79 y.o.   MRN: [id number]  HPI  IOV 07/20/12 79 year old pleasant male. Been having gastric symptioms on and offf since summer 2013. CT abdomen lung cut in July 2013 showed RLLL subpleural 1.5cm nodule. Had CT guided TTNA arrnaged but due to gastric issues this was placed on hold. This was follwed up with PET scan 06/29/12 that showed  1)  hypermetabolic nodule within the left parotid gland which  measures 8 mm (image 31) with SUV max = 5.7.   2) Chest: There is a left suprahilar mass surrounding the left upper  lobe bronchus measuring approximately 22 x 16 mm with SUV max = the  19.3. Adjacent hypermetabolic AP window node is difficult to see  on the CT portion and measures approximately 30 mm with SUV max =  16.2.    3) There is a focus of peripheral ground-glass opacity and pleural thickening in the superior left upper lobe mearuring12 mm x 12 mm  (image 74) with S UV max 5.4.   4) No PET lightting of the RLL nodule seen in July 2013 and still seen on PET CT 06/29/12  5) No additional hypermetabolic mediastinal lymph nodes.   6) There is hypermetabolic activity associated with  the left adrenal gland with SUV max = 4.5. Adrenal gland on the CT  portion is mildly full but not abnormally enlarged.    Currently main respiratory symptom is chronic cough for several months with some sputum. Denies acid reflux of sinus drainge. Noted he is on ACE inhibitors.      OV 08/10/12  Here with sons to discuss biopsy results  - LMB mass shows poorly differentiated carcinoma; favoring small cell   He has PET hot lesion in left main bronchus area, Left chest near chest wall and left adrenal. Of note, thee was a PET hot left parotid gland lesion that was seen by ENT Dr Lazarus Salines and in discussion with IR felt too small to biopsy  Dx is c/w Extensive STage small cell CA  You have lung cancer - which is likely of the small cell  type  Please see Dr Arbutus Ped for treatment options  I will see you back in 2 months to help support you through the course of your illness    OV 10/14/2012 Followup diagnosis of extensive stage small cell cancer. He presents with his son. He is undergoing chemotherapy. Overall he is doing well but specifically here several problems. She notices he is adjusting to his cancer diagnosis and use a 6/10 for feeling sad and depressed. Of note, this whole cancer was diagnosed when he was having depression spells. He was on at antidepressants through psychiatrist but he stopped it after his cancer diagnosis. Currently he feels sad and no sense of purpose but denies hopelessness and crying spells or suicidal or homicidal thoughts. He and his son feel is too much on the plate right now to go back and see the psychiatrist.  In addition he has mild pain in his right neck and his appetite is poor he is moderate amount of fatigue and this poor sense of well-being, and noted that he is not exercising   [place] Symptom Assessment Numerical Scale 0 is no problem -> 10 worst problem 10/14/2012  Midst of chemo andxrt  No Pain -> Worst pain 2, right neck  No Tiredness -> Worset tiredness 5  No Nausea -> Worst nausea  5  No Depression -> Worst depression 6, has hx of depression since summer 2013. Was on anti-dep through psych but stopped after cancer dx. Feels sad, No sense of purpose or worth but denies hopelessnes and cyring spells. Not intereted in med or counseling now  No Anxiety -> Worst Anxiety 0  No Drowsiness -> Worst Drowsiness 3.5  Best appetite-> Worst Appetitle 9  Best Feeling of well being -> Worst feeling 5  No dyspnea-> Worst dyspnea 0  Other problem (none -> severe) none  Completed by  Son and patient        Past, Family, Social reviewed: no change since last visit other than in history of present illness   Review of Systems  Constitutional: Negative for fever and unexpected weight change.   HENT: Negative for ear pain, nosebleeds, congestion, sore throat, rhinorrhea, sneezing, trouble swallowing, dental problem, postnasal drip and sinus pressure.   Eyes: Negative for redness and itching.  Respiratory: Negative for cough, chest tightness, shortness of breath and wheezing.   Cardiovascular: Negative for palpitations and leg swelling.  Gastrointestinal: Negative for nausea and vomiting.  Genitourinary: Negative for dysuria.  Musculoskeletal: Negative for joint swelling.  Skin: Negative for rash.  Neurological: Negative for headaches.  Hematological: Does not bruise/bleed easily.  Psychiatric/Behavioral: Negative for dysphoric mood. The patient is not nervous/anxious.        Objective:   Physical Exam  Nursing note and vitals reviewed. Constitutional: He is oriented to person, place, and time. He appears well-developed and well-nourished. No distress.  HENT:  Head: Normocephalic and atraumatic.  Right Ear: External ear normal.  Left Ear: External ear normal.  Mouth/Throat: Oropharynx is clear and moist. No oropharyngeal exudate.       Hard of hearing  Eyes: Conjunctivae normal and EOM are normal. Pupils are equal, round, and reactive to light. Right eye exhibits no discharge. Left eye exhibits no discharge. No scleral icterus.  Neck: Normal range of motion. Neck supple. No JVD present. No tracheal deviation present. No thyromegaly present.  Cardiovascular: Normal rate, regular rhythm and intact distal pulses.  Exam reveals no gallop and no friction rub.   No murmur heard. Pulmonary/Chest: Effort normal and breath sounds normal. No respiratory distress. He has no wheezes. He has no rales. He exhibits no tenderness.  Abdominal: Soft. Bowel sounds are normal. He exhibits no distension and no mass. There is no tenderness. There is no rebound and no guarding.  Musculoskeletal: Normal range of motion. He exhibits no edema and no tenderness.  Lymphadenopathy:    He has no  cervical adenopathy.  Neurological: He is alert and oriented to person, place, and time. He has normal reflexes. No cranial nerve deficit. Coordination normal.  Skin: Skin is warm and dry. No rash noted. He is not diaphoretic. No erythema. No pallor.  Psychiatric: He has a normal mood and affect. His behavior is normal. Judgment and thought content normal.   Vital signs reviewed       Assessment & Plan:

## 2012-10-14 NOTE — Patient Instructions (Addendum)
Glad you are doing well For fatigue: start doing treadmill 3 times a week but if balance is an issue do arm rower or recumbent bike. Also do light weights For depression: we will monitor Followup  - 4 months or sooner if needed

## 2012-10-15 ENCOUNTER — Ambulatory Visit (HOSPITAL_BASED_OUTPATIENT_CLINIC_OR_DEPARTMENT_OTHER): Payer: Medicare Other

## 2012-10-15 ENCOUNTER — Ambulatory Visit
Admission: RE | Admit: 2012-10-15 | Discharge: 2012-10-15 | Disposition: A | Discharge status: Home / Self Care | Payer: Medicare Other | Source: Ambulatory Visit | Attending: Radiation Oncology | Admitting: Radiation Oncology

## 2012-10-15 VITALS — BP 113/50 | HR 74 | Temp 97.1°F | Resp 18

## 2012-10-15 DIAGNOSIS — Z5111 Encounter for antineoplastic chemotherapy: Secondary | ICD-10-CM

## 2012-10-15 DIAGNOSIS — C3410 Malignant neoplasm of upper lobe, unspecified bronchus or lung: Secondary | ICD-10-CM

## 2012-10-15 DIAGNOSIS — C3490 Malignant neoplasm of unspecified part of unspecified bronchus or lung: Secondary | ICD-10-CM

## 2012-10-15 MED ORDER — SODIUM CHLORIDE 0.9 % IV SOLN
100.0000 mg/m2 | Freq: Once | INTRAVENOUS | Status: AC
  Administered 2012-10-15: 220 mg via INTRAVENOUS
  Filled 2012-10-15: qty 11

## 2012-10-15 MED ORDER — SODIUM CHLORIDE 0.9 % IJ SOLN
10.0000 mL | INTRAMUSCULAR | Status: DC | PRN
  Administered 2012-10-15: 10 mL
  Filled 2012-10-15: qty 10

## 2012-10-15 MED ORDER — SODIUM CHLORIDE 0.9 % IV SOLN
Freq: Once | INTRAVENOUS | Status: AC
  Administered 2012-10-15: 11:00:00 via INTRAVENOUS

## 2012-10-15 MED ORDER — PROCHLORPERAZINE MALEATE 10 MG PO TABS
10.0000 mg | ORAL_TABLET | Freq: Once | ORAL | Status: AC
  Administered 2012-10-15: 10 mg via ORAL

## 2012-10-15 MED ORDER — HEPARIN SOD (PORK) LOCK FLUSH 100 UNIT/ML IV SOLN
500.0000 [IU] | Freq: Once | INTRAVENOUS | Status: AC | PRN
  Administered 2012-10-15: 500 [IU]
  Filled 2012-10-15: qty 5

## 2012-10-15 NOTE — Patient Instructions (Addendum)
[facility] Discharge Instructions for Patients Receiving Chemotherapy  Today you received the following chemotherapy agents Etoposide.  To help prevent nausea and vomiting after your treatment, we encourage you to take your nausea medication.   If you develop nausea and vomiting that is not controlled by your nausea medication, call the clinic. If it is after clinic hours your family physician or the after hours number for the clinic or go to the Emergency Department.   BELOW ARE SYMPTOMS THAT SHOULD BE REPORTED IMMEDIATELY:  *FEVER GREATER THAN 100.5 F  *CHILLS WITH OR WITHOUT FEVER  NAUSEA AND VOMITING THAT IS NOT CONTROLLED WITH YOUR NAUSEA MEDICATION  *UNUSUAL SHORTNESS OF BREATH  *UNUSUAL BRUISING OR BLEEDING  TENDERNESS IN MOUTH AND THROAT WITH OR WITHOUT PRESENCE OF ULCERS  *URINARY PROBLEMS  *BOWEL PROBLEMS  UNUSUAL RASH Items with * indicate a potential emergency and should be followed up as soon as possible.  One of the nurses will contact you 24 hours after your treatment. Please let the nurse know about any problems that you may have experienced. Feel free to call the clinic you have any questions or concerns. The clinic phone number is [phone].   I have been informed and understand all the instructions given to me. I know to contact the clinic, my physician, or go to the Emergency Department if any problems should occur. I do not have any questions at this time, but understand that I may call the clinic during office hours   should I have any questions or need assistance in obtaining follow up care.    __________________________________________  _____________  __________ Signature of Patient or Authorized Representative            Date                   Time    __________________________________________ Nurse's Signature

## 2012-10-16 ENCOUNTER — Other Ambulatory Visit: Payer: Self-pay | Admitting: *Deleted

## 2012-10-16 ENCOUNTER — Ambulatory Visit (HOSPITAL_BASED_OUTPATIENT_CLINIC_OR_DEPARTMENT_OTHER): Payer: Medicare Other

## 2012-10-16 VITALS — BP 113/62 | HR 87 | Temp 96.8°F

## 2012-10-16 DIAGNOSIS — C3490 Malignant neoplasm of unspecified part of unspecified bronchus or lung: Secondary | ICD-10-CM

## 2012-10-16 MED ORDER — ROSUVASTATIN CALCIUM 10 MG PO TABS: 10.0000 mg | ORAL_TABLET | Freq: Every day | ORAL | Status: DC

## 2012-10-16 MED ORDER — LISINOPRIL 10 MG PO TABS: 10.0000 mg | ORAL_TABLET | Freq: Every day | ORAL | Status: DC

## 2012-10-16 MED ORDER — PEGFILGRASTIM INJECTION 6 MG/0.6ML
6.0000 mg | Freq: Once | SUBCUTANEOUS | Status: AC
  Administered 2012-10-16: 6 mg via SUBCUTANEOUS
  Filled 2012-10-16: qty 0.6

## 2012-10-19 ENCOUNTER — Telehealth: Payer: Self-pay | Admitting: Cardiology

## 2012-10-19 NOTE — Telephone Encounter (Signed)
New Problem:   Patient's son called in needing a refill of the patient's furosemide (LASIX) 20 MG tablet sent in to the [facility] listed on his profile.  Please call back.

## 2012-10-20 ENCOUNTER — Other Ambulatory Visit (HOSPITAL_COMMUNITY): Payer: Self-pay | Admitting: Internal Medicine

## 2012-10-20 MED ORDER — FUROSEMIDE 20 MG PO TABS: 20.0000 mg | ORAL_TABLET | Freq: Every day | ORAL | Status: DC

## 2012-10-21 NOTE — Telephone Encounter (Signed)
Do you want to refill  xarelto for this pt? Please advise. Carron Curie, CMA

## 2012-10-22 ENCOUNTER — Inpatient Hospital Stay (HOSPITAL_COMMUNITY)
Admission: EM | Admit: 2012-10-22 | Discharge: 2012-10-28 | DRG: 808 | Disposition: A | Discharge status: Home / Self Care | Payer: Medicare Other | Attending: Internal Medicine | Admitting: Internal Medicine

## 2012-10-22 ENCOUNTER — Telehealth: Payer: Self-pay | Admitting: Medical Oncology

## 2012-10-22 ENCOUNTER — Encounter (HOSPITAL_COMMUNITY): Payer: Self-pay

## 2012-10-22 ENCOUNTER — Encounter (HOSPITAL_COMMUNITY): Payer: Self-pay | Admitting: *Deleted

## 2012-10-22 ENCOUNTER — Emergency Department (HOSPITAL_COMMUNITY): Payer: Medicare Other

## 2012-10-22 ENCOUNTER — Emergency Department (HOSPITAL_COMMUNITY)
Admission: EM | Admit: 2012-10-22 | Discharge: 2012-10-22 | Discharge status: Against Medical Advice | Payer: Medicare Other | Source: Home / Self Care

## 2012-10-22 DIAGNOSIS — I25810 Atherosclerosis of coronary artery bypass graft(s) without angina pectoris: Secondary | ICD-10-CM

## 2012-10-22 DIAGNOSIS — I951 Orthostatic hypotension: Secondary | ICD-10-CM

## 2012-10-22 DIAGNOSIS — E46 Unspecified protein-calorie malnutrition: Secondary | ICD-10-CM

## 2012-10-22 DIAGNOSIS — Z9221 Personal history of antineoplastic chemotherapy: Secondary | ICD-10-CM

## 2012-10-22 DIAGNOSIS — E860 Dehydration: Secondary | ICD-10-CM

## 2012-10-22 DIAGNOSIS — Z7901 Long term (current) use of anticoagulants: Secondary | ICD-10-CM

## 2012-10-22 DIAGNOSIS — I129 Hypertensive chronic kidney disease with stage 1 through stage 4 chronic kidney disease, or unspecified chronic kidney disease: Secondary | ICD-10-CM | POA: Diagnosis present

## 2012-10-22 DIAGNOSIS — Y842 Radiological procedure and radiotherapy as the cause of abnormal reaction of the patient, or of later complication, without mention of misadventure at the time of the procedure: Secondary | ICD-10-CM | POA: Diagnosis present

## 2012-10-22 DIAGNOSIS — Z7982 Long term (current) use of aspirin: Secondary | ICD-10-CM

## 2012-10-22 DIAGNOSIS — I498 Other specified cardiac arrhythmias: Secondary | ICD-10-CM

## 2012-10-22 DIAGNOSIS — R5381 Other malaise: Secondary | ICD-10-CM | POA: Diagnosis present

## 2012-10-22 DIAGNOSIS — I2510 Atherosclerotic heart disease of native coronary artery without angina pectoris: Secondary | ICD-10-CM

## 2012-10-22 DIAGNOSIS — D709 Neutropenia, unspecified: Principal | ICD-10-CM

## 2012-10-22 DIAGNOSIS — R5081 Fever presenting with conditions classified elsewhere: Secondary | ICD-10-CM | POA: Diagnosis present

## 2012-10-22 DIAGNOSIS — T451X5A Adverse effect of antineoplastic and immunosuppressive drugs, initial encounter: Secondary | ICD-10-CM | POA: Diagnosis present

## 2012-10-22 DIAGNOSIS — D61810 Antineoplastic chemotherapy induced pancytopenia: Secondary | ICD-10-CM | POA: Diagnosis present

## 2012-10-22 DIAGNOSIS — R609 Edema, unspecified: Secondary | ICD-10-CM

## 2012-10-22 DIAGNOSIS — R918 Other nonspecific abnormal finding of lung field: Secondary | ICD-10-CM

## 2012-10-22 DIAGNOSIS — I4891 Unspecified atrial fibrillation: Secondary | ICD-10-CM | POA: Diagnosis present

## 2012-10-22 DIAGNOSIS — N183 Chronic kidney disease, stage 3 unspecified: Secondary | ICD-10-CM | POA: Diagnosis present

## 2012-10-22 DIAGNOSIS — K118 Other diseases of salivary glands: Secondary | ICD-10-CM

## 2012-10-22 DIAGNOSIS — I252 Old myocardial infarction: Secondary | ICD-10-CM

## 2012-10-22 DIAGNOSIS — R5383 Other fatigue: Secondary | ICD-10-CM | POA: Insufficient documentation

## 2012-10-22 DIAGNOSIS — N182 Chronic kidney disease, stage 2 (mild): Secondary | ICD-10-CM

## 2012-10-22 DIAGNOSIS — I10 Essential (primary) hypertension: Secondary | ICD-10-CM | POA: Diagnosis present

## 2012-10-22 DIAGNOSIS — M549 Dorsalgia, unspecified: Secondary | ICD-10-CM

## 2012-10-22 DIAGNOSIS — D61818 Other pancytopenia: Secondary | ICD-10-CM

## 2012-10-22 DIAGNOSIS — Z951 Presence of aortocoronary bypass graft: Secondary | ICD-10-CM

## 2012-10-22 DIAGNOSIS — C3490 Malignant neoplasm of unspecified part of unspecified bronchus or lung: Secondary | ICD-10-CM

## 2012-10-22 DIAGNOSIS — E876 Hypokalemia: Secondary | ICD-10-CM | POA: Diagnosis not present

## 2012-10-22 DIAGNOSIS — Z87891 Personal history of nicotine dependence: Secondary | ICD-10-CM

## 2012-10-22 DIAGNOSIS — Z8546 Personal history of malignant neoplasm of prostate: Secondary | ICD-10-CM

## 2012-10-22 DIAGNOSIS — R1013 Epigastric pain: Secondary | ICD-10-CM

## 2012-10-22 DIAGNOSIS — M129 Arthropathy, unspecified: Secondary | ICD-10-CM | POA: Diagnosis present

## 2012-10-22 DIAGNOSIS — R05 Cough: Secondary | ICD-10-CM

## 2012-10-22 DIAGNOSIS — Z79899 Other long term (current) drug therapy: Secondary | ICD-10-CM

## 2012-10-22 DIAGNOSIS — Z9861 Coronary angioplasty status: Secondary | ICD-10-CM

## 2012-10-22 DIAGNOSIS — K5900 Constipation, unspecified: Secondary | ICD-10-CM | POA: Diagnosis present

## 2012-10-22 DIAGNOSIS — I495 Sick sinus syndrome: Secondary | ICD-10-CM

## 2012-10-22 DIAGNOSIS — Z923 Personal history of irradiation: Secondary | ICD-10-CM

## 2012-10-22 DIAGNOSIS — E41 Nutritional marasmus: Secondary | ICD-10-CM | POA: Diagnosis present

## 2012-10-22 DIAGNOSIS — E039 Hypothyroidism, unspecified: Secondary | ICD-10-CM

## 2012-10-22 DIAGNOSIS — E119 Type 2 diabetes mellitus without complications: Secondary | ICD-10-CM | POA: Diagnosis present

## 2012-10-22 DIAGNOSIS — F329 Major depressive disorder, single episode, unspecified: Secondary | ICD-10-CM

## 2012-10-22 DIAGNOSIS — F0280 Dementia in other diseases classified elsewhere without behavioral disturbance: Secondary | ICD-10-CM

## 2012-10-22 DIAGNOSIS — E7800 Pure hypercholesterolemia, unspecified: Secondary | ICD-10-CM | POA: Diagnosis present

## 2012-10-22 DIAGNOSIS — F3289 Other specified depressive episodes: Secondary | ICD-10-CM | POA: Diagnosis present

## 2012-10-22 DIAGNOSIS — I509 Heart failure, unspecified: Secondary | ICD-10-CM | POA: Diagnosis present

## 2012-10-22 DIAGNOSIS — K208 Other esophagitis without bleeding: Secondary | ICD-10-CM | POA: Diagnosis present

## 2012-10-22 DIAGNOSIS — R42 Dizziness and giddiness: Secondary | ICD-10-CM

## 2012-10-22 LAB — CG4 I-STAT (LACTIC ACID): Lactic Acid, Venous: 2.77 mmol/L — ABNORMAL HIGH (ref 0.5–2.2)

## 2012-10-22 LAB — COMPREHENSIVE METABOLIC PANEL
CO2: 20 mEq/L (ref 19–32)
Calcium: 9.3 mg/dL (ref 8.4–10.5)
Creatinine, Ser: 1.12 mg/dL (ref 0.50–1.35)
GFR calc Af Amer: 69 mL/min — ABNORMAL LOW (ref >90)
GFR calc non Af Amer: 59 mL/min — ABNORMAL LOW (ref >90)
Glucose, Bld: 170 mg/dL — ABNORMAL HIGH (ref 70–99)

## 2012-10-22 LAB — CBC WITH DIFFERENTIAL/PLATELET
HCT: 29 % — ABNORMAL LOW (ref 39.0–52.0)
Hemoglobin: 9.8 g/dL — ABNORMAL LOW (ref 13.0–17.0)
MCH: 29.7 pg (ref 26.0–34.0)
MCHC: 33.8 g/dL (ref 30.0–36.0)
MCV: 87.9 fL (ref 78.0–100.0)
Platelets: 45 K/uL — ABNORMAL LOW (ref 150–400)
RBC: 3.3 MIL/uL — ABNORMAL LOW (ref 4.22–5.81)
RDW: 19 % — ABNORMAL HIGH (ref 11.5–15.5)
Smear Review: DECREASED
WBC: 0.4 K/uL — CL (ref 4.0–10.5)

## 2012-10-22 LAB — COMPREHENSIVE METABOLIC PANEL WITH GFR
ALT: 15 U/L (ref 0–53)
AST: 20 U/L (ref 0–37)
Albumin: 3.4 g/dL — ABNORMAL LOW (ref 3.5–5.2)
Alkaline Phosphatase: 98 U/L (ref 39–117)
BUN: 23 mg/dL (ref 6–23)
Chloride: 103 meq/L (ref 96–112)
Potassium: 4.9 meq/L (ref 3.5–5.1)
Sodium: 136 meq/L (ref 135–145)
Total Bilirubin: 1.4 mg/dL — ABNORMAL HIGH (ref 0.3–1.2)
Total Protein: 6.6 g/dL (ref 6.0–8.3)

## 2012-10-22 LAB — URINALYSIS, ROUTINE W REFLEX MICROSCOPIC
Bilirubin Urine: NEGATIVE
Glucose, UA: NEGATIVE mg/dL
Hgb urine dipstick: NEGATIVE
Ketones, ur: NEGATIVE mg/dL
Leukocytes, UA: NEGATIVE
Nitrite: NEGATIVE
Protein, ur: NEGATIVE mg/dL
Specific Gravity, Urine: 1.015 (ref 1.005–1.030)
Urobilinogen, UA: 1 mg/dL (ref 0.0–1.0)
pH: 5 (ref 5.0–8.0)

## 2012-10-22 MED ORDER — INSULIN ASPART 100 UNIT/ML ~~LOC~~ SOLN
0.0000 [IU] | Freq: Three times a day (TID) | SUBCUTANEOUS | Status: DC
  Administered 2012-10-24 – 2012-10-28 (×2): 1 [IU] via SUBCUTANEOUS

## 2012-10-22 MED ORDER — LINACLOTIDE 145 MCG PO CAPS: 145.0000 ug | ORAL_CAPSULE | ORAL | Status: DC

## 2012-10-22 MED ORDER — PROCHLORPERAZINE MALEATE 10 MG PO TABS
10.0000 mg | ORAL_TABLET | Freq: Every day | ORAL | Status: DC
  Administered 2012-10-22 – 2012-10-27 (×4): 10 mg via ORAL
  Filled 2012-10-22 (×7): qty 1

## 2012-10-22 MED ORDER — DEXTROSE 5 % IV SOLN
2.0000 g | Freq: Three times a day (TID) | INTRAVENOUS | Status: DC
  Administered 2012-10-22 – 2012-10-26 (×11): 2 g via INTRAVENOUS
  Filled 2012-10-22 (×15): qty 2

## 2012-10-22 MED ORDER — SODIUM CHLORIDE 0.9 % IV SOLN: Freq: Once | INTRAVENOUS | Status: DC

## 2012-10-22 MED ORDER — LINACLOTIDE 145 MCG PO CAPS
145.0000 ug | ORAL_CAPSULE | ORAL | Status: DC
  Administered 2012-10-23 – 2012-10-27 (×3): 145 ug via ORAL
  Filled 2012-10-22 (×3): qty 1

## 2012-10-22 MED ORDER — SODIUM CHLORIDE 0.9 % IV SOLN
INTRAVENOUS | Status: DC
  Administered 2012-10-22: 21:00:00 via INTRAVENOUS

## 2012-10-22 MED ORDER — SENNA 8.6 MG PO TABS
1.0000 | ORAL_TABLET | Freq: Two times a day (BID) | ORAL | Status: DC
  Administered 2012-10-22 – 2012-10-28 (×9): 8.6 mg via ORAL
  Filled 2012-10-22 (×15): qty 1

## 2012-10-22 MED ORDER — POTASSIUM CHLORIDE CRYS ER 10 MEQ PO TBCR
10.0000 meq | EXTENDED_RELEASE_TABLET | Freq: Every morning | ORAL | Status: DC
  Administered 2012-10-23: 10 meq via ORAL
  Filled 2012-10-22 (×2): qty 1

## 2012-10-22 MED ORDER — MORPHINE SULFATE 2 MG/ML IJ SOLN
2.0000 mg | INTRAMUSCULAR | Status: DC | PRN
  Administered 2012-10-22 – 2012-10-26 (×10): 2 mg via INTRAVENOUS
  Filled 2012-10-22 (×11): qty 1

## 2012-10-22 MED ORDER — LISINOPRIL 10 MG PO TABS
10.0000 mg | ORAL_TABLET | Freq: Every morning | ORAL | Status: DC
  Administered 2012-10-23 – 2012-10-28 (×5): 10 mg via ORAL
  Filled 2012-10-22 (×6): qty 1

## 2012-10-22 MED ORDER — LEVOTHYROXINE SODIUM 75 MCG PO TABS
75.0000 ug | ORAL_TABLET | Freq: Every day | ORAL | Status: DC
  Administered 2012-10-23 – 2012-10-28 (×5): 75 ug via ORAL
  Filled 2012-10-22 (×7): qty 1

## 2012-10-22 MED ORDER — LIDOCAINE-PRILOCAINE 2.5-2.5 % EX CREA
TOPICAL_CREAM | Freq: Once | CUTANEOUS | Status: AC
  Administered 2012-10-22: 1 via TOPICAL
  Filled 2012-10-22: qty 5

## 2012-10-22 MED ORDER — SODIUM CHLORIDE 0.9 % IV BOLUS (SEPSIS)
1000.0000 mL | Freq: Once | INTRAVENOUS | Status: AC
  Administered 2012-10-22: 1000 mL via INTRAVENOUS

## 2012-10-22 MED ORDER — SUCRALFATE 1 GM/10ML PO SUSP
1.0000 g | Freq: Four times a day (QID) | ORAL | Status: DC
  Administered 2012-10-22 – 2012-10-28 (×19): 1 g via ORAL
  Filled 2012-10-22 (×25): qty 10

## 2012-10-22 MED ORDER — RIVAROXABAN 15 MG PO TABS
15.0000 mg | ORAL_TABLET | Freq: Every day | ORAL | Status: DC
  Administered 2012-10-22: 15 mg via ORAL
  Filled 2012-10-22 (×2): qty 1

## 2012-10-22 NOTE — ED Notes (Signed)
Pt has esophageal CA, had 6 weeks of radiation to his esophagus that ended this past Thursday. Resulted in esophageal burn. No appetite, unable to eat and drink d/t pain in esophagus. Tolerates V8 fruit juices and some boost protein supplements some days, not much since this past Monday. A&Ox4, ambulatory, stable. Son at bedside.

## 2012-10-22 NOTE — ED Notes (Signed)
Pt is CA pt and has had increasing general weakness over the past couple of days.  Pt has been having poor po intake, per family no solid for several days and only some juices in the last couple of days.  No focal weakness.  Gave pt a mask to wear.

## 2012-10-22 NOTE — Telephone Encounter (Signed)
Les reported that pt had a " set back today" with severe weakness , tremor in hands. Recent chemotherapy last week with neulasta. Per Dimas Alexandria she instructed Les to take pt to ED for evaluation.

## 2012-10-22 NOTE — ED Notes (Addendum)
IV start team arrived,but pt requested numbing cream, refused Portacath access w/o lidocaine cream, refused PIV.

## 2012-10-22 NOTE — ED Provider Notes (Signed)
History     CSN: [id number]  Arrival date & time 10/22/12  1501   First MD Initiated Contact with Patient 10/22/12 1547      Chief Complaint  Patient presents with  . Weakness    (Consider location/radiation/quality/duration/timing/severity/associated sxs/prior treatment) HPI Comments: Patient with history of small cell lung cancer with known metastases who has been receiving both radiation and chemotherapy. He recently completed his radiation treatments but did develop some esophageal radiation damage and has difficulty swallowing due to pain. He recently ended his most recent round of chemotherapy and is due for another session soon. The patient's son is concerned that the patient has not been able to eat or drink much in the last week, even using booster. This morning he tried to take 2 tablespoons of this and actually vomited. Patient denies any diarrhea. He has had a mild runny nose but no coughing, sinus pressure, stiff neck, rash or headache. Patient reports no significant appetite. He has had some chills this morning. The patient also has a significant history of hypertension and diabetes. Patient reports he did have his influenza vaccination this year. Patient and family did initially go to [facility] this morning for his symptoms but do to an excessively long wait, they came here to [facility].  Patient is a 79 y.o. male presenting with weakness. The history is provided by the patient and a relative.  Weakness The primary symptoms include nausea and vomiting. Primary symptoms do not include headaches or fever.  Additional symptoms include weakness. Additional symptoms do not include neck stiffness.    Past Medical History  Diagnosis Date  . CAD (coronary artery disease)     a. 1990 CABGx3: LIMA->LAD, VG->OM, VG->dRCA;  b. 05/2007 VF Arrest/Cath/PCI: 100 VG->OM tx w/ BMS, 95 VG->RCA tx w/ bms;  c. 06/2007 90 Native PDA tx w DES ;  d. 05/2009 Echo EF 60-65%, nl WM, mild MR; e. normal  lexiscan myoview 03/30/12  . HTN (hypertension)   . Hypercholesteremia   . Atrial fibrillation     a. had been on tikosyn -> d/c 09/2010;  b. Was on Amio ->d/c 09/2011;  c.  anticoagulated w/ Pradaxa  . Junctional ectopic tachycardia     a. noted 09/2010  . Sinus bradycardia   . Back pain     a. Followed by [facility] - on prednisone  . Prostate cancer   . Unsteady gait     a. in rehab for core strengthening  . Colitis   . Tubular adenoma of colon 02/2007  . Myocardial infarction   . Arthritis   . CHF (congestive heart failure)   . Depression   . Internal hemorrhoids without mention of complication   . Diverticulosis of colon (without mention of hemorrhage)   . Diabetes mellitus     Type 2 NIDDM x 15 years  . CKD (chronic kidney disease), stage III     pt denies CKD  . GERD (gastroesophageal reflux disease)     Past Surgical History  Procedure Date  . Coronary artery bypass graft 1990  . Prostate surgery 01/1992    retropubic prostatectomy  . Cardiac valve surgery   . Coronary angioplasty with stent placement 2008    x 3   . Rt finger trigger a-1 pulley   . Cholecystectomy 05/05/12  . Cholecystectomy 05/05/2012    Procedure: LAPAROSCOPIC CHOLECYSTECTOMY WITH INTRAOPERATIVE CHOLANGIOGRAM;  Surgeon: Mariella Saa, MD;  Location: [facility] OR;  Service: General;  Laterality: N/A;  .  Endobronchial ultrasound 08/04/2012    Procedure: ENDOBRONCHIAL ULTRASOUND;  Surgeon: Kalman Shan, MD;  Location: [facility] OR;  Service: Cardiopulmonary;  Laterality: N/A;  . Video bronchoscopy 08/04/2012    Procedure: VIDEO BRONCHOSCOPY WITH FLUORO;  Surgeon: Kalman Shan, MD;  Location: [facility] OR;  Service: Cardiopulmonary;  Laterality: N/A;    Family History  Problem Relation Age of Onset  . Other Father     died of unknown cause @ young age  . Coronary artery disease Mother     died in late 53's w/ "hardening of the arteries"  . Heart disease Mother   . Heart disease Son   . Diabetes Son   . Colon  cancer Neg Hx     History  Substance Use Topics  . Smoking status: Former Smoker -- 40 years    Types: Cigarettes    Quit date: 09/23/1988  . Smokeless tobacco: Never Used     Comment: quit 20+ years ago  . Alcohol Use: No      Review of Systems  Constitutional: Positive for chills and appetite change. Negative for fever.  HENT: Positive for rhinorrhea. Negative for neck stiffness.   Respiratory: Negative for cough and wheezing.   Cardiovascular: Negative for chest pain.  Gastrointestinal: Positive for nausea and vomiting. Negative for abdominal pain and diarrhea.  Musculoskeletal: Negative for back pain.  Skin: Negative for rash.  Neurological: Positive for weakness. Negative for headaches.  All other systems reviewed and are negative.    Allergies  Review of patient's allergies indicates no known allergies.  Home Medications   Current Outpatient Rx  Name  Route  Sig  Dispense  Refill  . ASPIRIN 81 MG PO TABS   Oral   Take 1 tablet (81 mg total) by mouth every other day.   30 tablet   1   . FUROSEMIDE 20 MG PO TABS   Oral   Take 20 mg by mouth every morning.         Marland Kitchen GLIPIZIDE ER 10 MG PO TB24   Oral   Take 10 mg by mouth every morning.         Marland Kitchen HYDROCODONE-ACETAMINOPHEN 7.5-325 MG PO TABS   Oral   Take 1 tablet by mouth every 6 (six) hours as needed. For pain         . LANSOPRAZOLE 15 MG PO CPDR   Oral   Take 15 mg by mouth at bedtime.         Marland Kitchen LEVOTHYROXINE SODIUM 75 MCG PO TABS   Oral   Take 75 mcg by mouth every morning.          Marland Kitchen LIDOCAINE-PRILOCAINE 2.5-2.5 % EX CREA   Topical   Apply topically as needed. Apply to port 1 hour before chemotherapy, cover with plastic dressing.   30 g   0   . LINACLOTIDE 145 MCG PO CAPS   Oral   Take 145 mcg by mouth every other day.         Marland Kitchen LISINOPRIL 10 MG PO TABS   Oral   Take 10 mg by mouth every morning.         Marland Kitchen ONE-DAILY MULTI VITAMINS PO TABS   Oral   Take 1 tablet by mouth  every morning.          Marland Kitchen NITROGLYCERIN 0.4 MG SL SUBL   Sublingual   Place 0.4 mg under the tongue every 5 (five) minutes as needed. For chest pain         .  POTASSIUM CHLORIDE CRYS ER 10 MEQ PO TBCR   Oral   Take 10 mEq by mouth every morning.          Marland Kitchen PROCHLORPERAZINE MALEATE 10 MG PO TABS   Oral   Take 10 mg by mouth at bedtime.         Marland Kitchen RIVAROXABAN 15 MG PO TABS   Oral   Take 15 mg by mouth daily with supper.         Marland Kitchen ROSUVASTATIN CALCIUM 10 MG PO TABS   Oral   Take 10 mg by mouth every morning.         . SUCRALFATE 1 GM/10ML PO SUSP   Oral   Take 1 g by mouth 4 (four) times daily. Refill called into [facility] [address] To [facility] [phone] directions unchanged with one refill           BP 139/62  Pulse 108  Temp 98.4 F (36.9 C) (Oral)  Resp 18  SpO2 99%  Physical Exam  Nursing note and vitals reviewed. Constitutional: He is oriented to person, place, and time. He appears well-developed and well-nourished.  HENT:  Head: Normocephalic and atraumatic.  Nose: Rhinorrhea present. No mucosal edema.  Mouth/Throat: Uvula is midline. No oropharyngeal exudate or posterior oropharyngeal erythema.  Eyes: EOM are normal. Pupils are equal, round, and reactive to light.  Neck: Normal range of motion. Neck supple. No tracheal deviation present.  Cardiovascular: Regular rhythm, S1 normal, S2 normal and normal heart sounds.   No extrasystoles are present. Tachycardia present.   No murmur heard. Pulmonary/Chest: Effort normal. No stridor. No respiratory distress. He has no wheezes.  Abdominal: Soft. He exhibits no distension. There is no tenderness. There is no rebound.  Musculoskeletal: He exhibits no edema.  Neurological: He is alert and oriented to person, place, and time. No cranial nerve deficit. Coordination normal.  Skin: Skin is warm and dry. No rash noted.    ED Course  Procedures (including critical care time)  Labs Reviewed  CBC WITH  DIFFERENTIAL - Abnormal; Notable for the following:    WBC 0.4 (*)     RBC 3.30 (*)     Hemoglobin 9.8 (*)     HCT 29.0 (*)     RDW 19.0 (*)     Platelets 45 (*)  PLATELET COUNT CONFIRMED BY SMEAR   All other components within normal limits  COMPREHENSIVE METABOLIC PANEL - Abnormal; Notable for the following:    Glucose, Bld 170 (*)     Albumin 3.4 (*)     Total Bilirubin 1.4 (*)     GFR calc non Af Amer 59 (*)     GFR calc Af Amer 69 (*)     All other components within normal limits  URINALYSIS, ROUTINE W REFLEX MICROSCOPIC - Abnormal; Notable for the following:    APPearance CLOUDY (*)     All other components within normal limits  CG4 I-STAT (LACTIC ACID) - Abnormal; Notable for the following:    Lactic Acid, Venous 2.77 (*)     All other components within normal limits  CULTURE, BLOOD (ROUTINE X 2)  CULTURE, BLOOD (ROUTINE X 2)  URINE CULTURE   Dg Chest Port 1 View  10/22/2012  *RADIOLOGY REPORT*  Clinical Data: Weakness, shortness of breath.  PORTABLE CHEST - 1 VIEW  Comparison: 08/04/2012  Findings: The prior CABG.  Mild hyperinflation/COPD.  Right Port-A- Cath is in place with the tip in the lower SVC.  Calcified granuloma in the right upper lobe.  Previously seen left suprahilar density not well appreciated by plain film.  No effusions.  No acute bony abnormality.  IMPRESSION: Hyperinflation/COPD.  Left suprahilar nodular density seen on prior CT not well appreciated on plain film.   Original Report Authenticated By: Charlett Nose, M.D.      1. Neutropenic fever   2. Dehydration     6:40 PM I spoke to Dr. Welton Flakes who agrees admit to [facility] for symptoms since pt is now patient of Dr. Sofie Hartigan.  Spoke to Dr. Sharl Ma who agrees to admit, will keep here at [facility] in case ID needs to get involved.    MDM  Pt with neutropenia, tachycardia, meeting SIRS criteria, I suspect may have fever, but rectal temp is contraindicated at this time.  Will start on cefepime after obtaining cultures.  Will  discuss with Dr. Jacky Kindle for admission.          Gavin Pound. Latoiya Maradiaga, MD 10/22/12 1610

## 2012-10-22 NOTE — H&P (Addendum)
PCP:   Minda Meo, MD   Chief Complaint:  weakness  HPI: 79 yr old male with h/o small cell lung cancer with presence of questionable brain metastases, who underwent systemic chemotherapy with carboplatin and etoposide status post 2 cycles concurrent with radiation. Patient also underwent 33 doses of radiation treatment. Patient's last chemo was last week, and since than he has not been feeling well, he has very poor po intake. He admits to difficulty swallowing. Patient has been on carafate but says that he has not been taking carafate, as it caused constipation.he denies fever, admits to right sided chest pain worse with breathing. He denies nausea, vomiting but admits to one loose stool this morning.   Allergies:  No Known Allergies    Past Medical History  Diagnosis Date  . CAD (coronary artery disease)     a. 1990 CABGx3: LIMA->LAD, VG->OM, VG->dRCA;  b. 05/2007 VF Arrest/Cath/PCI: 100 VG->OM tx w/ BMS, 95 VG->RCA tx w/ bms;  c. 06/2007 90 Native PDA tx w DES ;  d. 05/2009 Echo EF 60-65%, nl WM, mild MR; e. normal lexiscan myoview 03/30/12  . HTN (hypertension)   . Hypercholesteremia   . Atrial fibrillation     a. had been on tikosyn -> d/c 09/2010;  b. Was on Amio ->d/c 09/2011;  c.  anticoagulated w/ Pradaxa  . Junctional ectopic tachycardia     a. noted 09/2010  . Sinus bradycardia   . Back pain     a. Followed by [facility] - on prednisone  . Prostate cancer   . Unsteady gait     a. in rehab for core strengthening  . Colitis   . Tubular adenoma of colon 02/2007  . Myocardial infarction   . Arthritis   . CHF (congestive heart failure)   . Depression   . Internal hemorrhoids without mention of complication   . Diverticulosis of colon (without mention of hemorrhage)   . Diabetes mellitus     Type 2 NIDDM x 15 years  . CKD (chronic kidney disease), stage III     pt denies CKD  . GERD (gastroesophageal reflux disease)     Past Surgical History  Procedure Date  . Coronary  artery bypass graft 1990  . Prostate surgery 01/1992    retropubic prostatectomy  . Cardiac valve surgery   . Coronary angioplasty with stent placement 2008    x 3   . Rt finger trigger a-1 pulley   . Cholecystectomy 05/05/12  . Cholecystectomy 05/05/2012    Procedure: LAPAROSCOPIC CHOLECYSTECTOMY WITH INTRAOPERATIVE CHOLANGIOGRAM;  Surgeon: Mariella Saa, MD;  Location: [facility] OR;  Service: General;  Laterality: N/A;  . Endobronchial ultrasound 08/04/2012    Procedure: ENDOBRONCHIAL ULTRASOUND;  Surgeon: Kalman Shan, MD;  Location: [facility] OR;  Service: Cardiopulmonary;  Laterality: N/A;  . Video bronchoscopy 08/04/2012    Procedure: VIDEO BRONCHOSCOPY WITH FLUORO;  Surgeon: Kalman Shan, MD;  Location: [facility] OR;  Service: Cardiopulmonary;  Laterality: N/A;    Prior to Admission medications   Medication Sig Start Date End Date Taking? Authorizing Provider  aspirin 81 MG tablet Take 1 tablet (81 mg total) by mouth every other day. 08/06/12  Yes Kalman Shan, MD  furosemide (LASIX) 20 MG tablet Take 20 mg by mouth every morning. 10/20/12  Yes Herby Abraham, MD  glipiZIDE (GLUCOTROL XL) 10 MG 24 hr tablet Take 10 mg by mouth every morning.   Yes Historical Provider, MD  HYDROcodone-acetaminophen (NORCO) 7.5-325 MG per tablet Take 1  tablet by mouth every 6 (six) hours as needed. For pain   Yes Billie Lade, MD  lansoprazole (PREVACID) 15 MG capsule Take 15 mg by mouth at bedtime.   Yes Historical Provider, MD  levothyroxine (SYNTHROID, LEVOTHROID) 75 MCG tablet Take 75 mcg by mouth every morning.    Yes Historical Provider, MD  lidocaine-prilocaine (EMLA) cream Apply topically as needed. Apply to port 1 hour before chemotherapy, cover with plastic dressing. 08/28/12  Yes Si Gaul, MD  Linaclotide [facility]) 145 MCG CAPS Take 145 mcg by mouth every other day.   Yes Historical Provider, MD  lisinopril (PRINIVIL,ZESTRIL) 10 MG tablet Take 10 mg by mouth every morning. 10/16/12  Yes  Herby Abraham, MD  Multiple Vitamin (MULTIVITAMIN) tablet Take 1 tablet by mouth every morning.    Yes Historical Provider, MD  nitroGLYCERIN (NITROSTAT) 0.4 MG SL tablet Place 0.4 mg under the tongue every 5 (five) minutes as needed. For chest pain   Yes Historical Provider, MD  potassium chloride (K-DUR,KLOR-CON) 10 MEQ tablet Take 10 mEq by mouth every morning.    Yes Historical Provider, MD  prochlorperazine (COMPAZINE) 10 MG tablet Take 10 mg by mouth at bedtime. 10/06/12  Yes Si Gaul, MD  Rivaroxaban (XARELTO) 15 MG TABS tablet Take 15 mg by mouth daily with supper. 08/06/12  Yes Kalman Shan, MD  rosuvastatin (CRESTOR) 10 MG tablet Take 10 mg by mouth every morning. 10/16/12  Yes Herby Abraham, MD  sucralfate (CARAFATE) 1 GM/10ML suspension Take 1 g by mouth 4 (four) times daily. Refill called into [facility] [address] To [facility] [phone] directions unchanged with one refill 10/08/12  Yes Billie Lade, MD    Social History:  reports that he quit smoking about 24 years ago. His smoking use included Cigarettes. He quit after 40 years of use. He has never used smokeless tobacco. He reports that he does not drink alcohol or use illicit drugs.  Family History  Problem Relation Age of Onset  . Other Father     died of unknown cause @ young age  . Coronary artery disease Mother     died in late 82's w/ "hardening of the arteries"  . Heart disease Mother   . Heart disease Son   . Diabetes Son   . Colon cancer Neg Hx     Review of Systems:  HEENT: Denies headache, blurred vision, runny nose, sore throat,  Neck: Denies thyroid problems,lymphadenopathy Chest : Denies shortness of breath, no history of COPD Heart : Denies Chest pain,  coronary arterey disease GI: See HPI GU: Denies dysuria, urgency, frequency of urination, hematuria Neuro: Denies stroke, seizures, syncope   Physical Exam: Blood pressure 140/59, pulse 117, temperature 98.4 F (36.9 C),  temperature source Oral, resp. rate 18, SpO2 100.00%. Constitutional:   Patient in no acute distress and cooperative with exam. Head: Normocephalic and atraumatic Mouth: Mucus membranes moist Eyes: PERRL, EOMI, conjunctivae normal Neck: Supple, No Thyromegaly Cardiovascular: RRR, S1 normal, S2 normal Pulmonary/Chest: CTAB, no wheezes, rales, or rhonchi Abdominal: Soft. Non-tender, non-distended, bowel sounds are normal, no masses, organomegaly, or guarding present.  Neurological: A&O x3, Strenght is normal and symmetric bilaterally, cranial nerve II-XII are grossly intact, no focal motor deficit, sensory intact to light touch bilaterally.  Extremities : No Cyanosis, Clubbing or Edema   Labs on Admission:  Results for orders placed during the hospital encounter of 10/22/12 (from the past 48 hour(s))  CBC WITH DIFFERENTIAL  Status: Abnormal   Collection Time   10/22/12  3:15 PM      Component Value Range Comment   WBC 0.4 (*) 4.0 - 10.5 K/uL    RBC 3.30 (*) 4.22 - 5.81 MIL/uL    Hemoglobin 9.8 (*) 13.0 - 17.0 g/dL    HCT 16.1 (*) 09.6 - 52.0 %    MCV 87.9  78.0 - 100.0 fL    MCH 29.7  26.0 - 34.0 pg    MCHC 33.8  30.0 - 36.0 g/dL    RDW 04.5 (*) 40.9 - 15.5 %    Platelets 45 (*) 150 - 400 K/uL PLATELET COUNT CONFIRMED BY SMEAR   Neutrophils Relative TOO FEW TO COUNT, SMEAR AVAILABLE FOR REVIEW  43 - 77 %    Neutro Abs TOO FEW TO COUNT, SMEAR AVAILABLE FOR REVIEW  1.7 - 7.7 K/uL    Lymphocytes Relative TOO FEW TO COUNT, SMEAR AVAILABLE FOR REVIEW  12 - 46 %    Lymphs Abs TOO FEW TO COUNT, SMEAR AVAILABLE FOR REVIEW  0.7 - 4.0 K/uL    Monocytes Relative TOO FEW TO COUNT, SMEAR AVAILABLE FOR REVIEW  3 - 12 %    Monocytes Absolute TOO FEW TO COUNT, SMEAR AVAILABLE FOR REVIEW  0.1 - 1.0 K/uL    Eosinophils Relative TOO FEW TO COUNT, SMEAR AVAILABLE FOR REVIEW  0 - 5 %    Eosinophils Absolute TOO FEW TO COUNT, SMEAR AVAILABLE FOR REVIEW  0.0 - 0.7 K/uL    Basophils Relative TOO FEW TO  COUNT, SMEAR AVAILABLE FOR REVIEW  0 - 1 %    Basophils Absolute TOO FEW TO COUNT, SMEAR AVAILABLE FOR REVIEW  0.0 - 0.1 K/uL    RBC Morphology MORPHOLOGY UNREMARKABLE      Smear Review PLATELETS APPEAR DECREASED     COMPREHENSIVE METABOLIC PANEL     Status: Abnormal   Collection Time   10/22/12  3:15 PM      Component Value Range Comment   Sodium 136  135 - 145 mEq/L    Potassium 4.9  3.5 - 5.1 mEq/L    Chloride 103  96 - 112 mEq/L    CO2 20  19 - 32 mEq/L    Glucose, Bld 170 (*) 70 - 99 mg/dL    BUN 23  6 - 23 mg/dL    Creatinine, Ser 8.11  0.50 - 1.35 mg/dL    Calcium 9.3  8.4 - 91.4 mg/dL    Total Protein 6.6  6.0 - 8.3 g/dL    Albumin 3.4 (*) 3.5 - 5.2 g/dL    AST 20  0 - 37 U/L    ALT 15  0 - 53 U/L    Alkaline Phosphatase 98  39 - 117 U/L    Total Bilirubin 1.4 (*) 0.3 - 1.2 mg/dL    GFR calc non Af Amer 59 (*) >90 mL/min    GFR calc Af Amer 69 (*) >90 mL/min   URINALYSIS, ROUTINE W REFLEX MICROSCOPIC     Status: Abnormal   Collection Time   10/22/12  5:07 PM      Component Value Range Comment   Color, Urine YELLOW  YELLOW    APPearance CLOUDY (*) CLEAR    Specific Gravity, Urine 1.015  1.005 - 1.030    pH 5.0  5.0 - 8.0    Glucose, UA NEGATIVE  NEGATIVE mg/dL    Hgb urine dipstick NEGATIVE  NEGATIVE    Bilirubin Urine  NEGATIVE  NEGATIVE    Ketones, ur NEGATIVE  NEGATIVE mg/dL    Protein, ur NEGATIVE  NEGATIVE mg/dL    Urobilinogen, UA 1.0  0.0 - 1.0 mg/dL    Nitrite NEGATIVE  NEGATIVE    Leukocytes, UA NEGATIVE  NEGATIVE MICROSCOPIC NOT DONE ON URINES WITH NEGATIVE PROTEIN, BLOOD, LEUKOCYTES, NITRITE, OR GLUCOSE <1000 mg/dL.  CG4 I-STAT (LACTIC ACID)     Status: Abnormal   Collection Time   10/22/12  5:27 PM      Component Value Range Comment   Lactic Acid, Venous 2.77 (*) 0.5 - 2.2 mmol/L     Radiological Exams on Admission: Dg Chest Port 1 View  10/22/2012  *RADIOLOGY REPORT*  Clinical Data: Weakness, shortness of breath.  PORTABLE CHEST - 1 VIEW  Comparison:  08/04/2012  Findings: The prior CABG.  Mild hyperinflation/COPD.  Right Port-A- Cath is in place with the tip in the lower SVC.  Calcified granuloma in the right upper lobe.  Previously seen left suprahilar density not well appreciated by plain film.  No effusions.  No acute bony abnormality.  IMPRESSION: Hyperinflation/COPD.  Left suprahilar nodular density seen on prior CT not well appreciated on plain film.   Original Report Authenticated By: Charlett Nose, M.D.     Assessment/Plan Severe neutropenia Esophagitis  Atrial fibrillation Small cell lung cancer  Severe neutropenia Patient has absolute neutropenia with total WBC count of 400. There was a question of possible fever in the ED the patient had normal oral temperature. Patient was given cefepime in the ED. I called and discussed Dr. Park Breed, patient received Neulasta last week after the chemotherapy so he does not need anymore Neupogen at this time. We'll continue the patient on vancomycin and cefepime for possible neutropenic fever for at least 72 hours to the urine culture and cultures come back negative or patient's white count improves. He  Esophagitis Patient has esophagitis most likely due to radiation treatments. Patient has been on Carafate 1 g 4 times a day. He'll continue on that in the hospital. Also start the patient on X. 40 mg IV daily.   Atrial fibrillation Will continue patient on anticoagulation with xarelto   Small cell lung cancer Patient is followed by oncology.  he is due to get his next chemotherapy next week    constipation We'll start him on Senokot 1 tablet by mouth twice a day Continue linzess 145 mcg by mouth every other day   Diabetes mellitus Restart the patient's Lantus insulin Will hold the oral medications       Time Spent on Admission: 75 min  Momina Hunton S [facility] Pager: [phone] 10/22/2012, 7:24 PM

## 2012-10-22 NOTE — ED Notes (Signed)
WBC 0.4, EDP aware.

## 2012-10-22 NOTE — Progress Notes (Signed)
ANTIBIOTIC CONSULT NOTE - INITIAL  Pharmacy Consult for Vancomycin Indication: Severe Neutropenia  No Known Allergies  Patient Measurements: Weight - 90.9kg Height - 178cm  Vital Signs: Temp: 98.4 F (36.9 C) (01/30 1651) Temp src: Oral (01/30 1651) BP: 140/59 mmHg (01/30 1856) Pulse Rate: 117  (01/30 1856)  Labs:  Basename 10/22/12 1515  WBC 0.4*  HGB 9.8*  PLT 45*  LABCREA --  CREATININE 1.12   Estimated CrCl = 60 ml/min  Microbiology: No results found for this or any previous visit (from the past 720 hour(s)).  Medical History: Past Medical History  Diagnosis Date  . CAD (coronary artery disease)     a. 1990 CABGx3: LIMA->LAD, VG->OM, VG->dRCA;  b. 05/2007 VF Arrest/Cath/PCI: 100 VG->OM tx w/ BMS, 95 VG->RCA tx w/ bms;  c. 06/2007 90 Native PDA tx w DES ;  d. 05/2009 Echo EF 60-65%, nl WM, mild MR; e. normal lexiscan myoview 03/30/12  . HTN (hypertension)   . Hypercholesteremia   . Atrial fibrillation     a. had been on tikosyn -> d/c 09/2010;  b. Was on Amio ->d/c 09/2011;  c.  anticoagulated w/ Pradaxa  . Junctional ectopic tachycardia     a. noted 09/2010  . Sinus bradycardia   . Back pain     a. Followed by [facility] - on prednisone  . Prostate cancer   . Unsteady gait     a. in rehab for core strengthening  . Colitis   . Tubular adenoma of colon 02/2007  . Myocardial infarction   . Arthritis   . CHF (congestive heart failure)   . Depression   . Internal hemorrhoids without mention of complication   . Diverticulosis of colon (without mention of hemorrhage)   . Diabetes mellitus     Type 2 NIDDM x 15 years  . CKD (chronic kidney disease), stage III     pt denies CKD  . GERD (gastroesophageal reflux disease)    Assessment: 79yo male admitted with complex medical history, elevated lactic acid, and severe neutropenia.  He is currently receiving IV Cefepime 2 gm every 8 hours.  He is afebrile, urine and blood cultures have been obtained.  We have been asked to  broaden his antibiotic regimen with IV Vancomycin.  Goal of Therapy:  Vancomycin trough level 15-20 mcg/ml  Plan:   - Begin IV Vancomycin 1 gm every 12 hours  - Monitor renal function and adjust accordingly  - Monitor culture data and change antibiotics as needed  - Monitor for therapeutic response  Nadara Mustard, PharmD., MS Clinical Pharmacist Pager:  [phone] Thank you for allowing pharmacy to be part of this patients care team. 10/22/2012,8:36 PM

## 2012-10-22 NOTE — ED Notes (Signed)
Patient is a cancer patient and had his last chemo treatment 8 days ago. Patient's family reports that the patient has not eaten properly and is c/o weakness and dizziness. Patient's family called his cancer doctor and was told to come to the ED for further evaluation and treatment.

## 2012-10-23 ENCOUNTER — Encounter (HOSPITAL_COMMUNITY): Payer: Self-pay | Admitting: Family Medicine

## 2012-10-23 DIAGNOSIS — R5381 Other malaise: Secondary | ICD-10-CM

## 2012-10-23 DIAGNOSIS — D709 Neutropenia, unspecified: Secondary | ICD-10-CM

## 2012-10-23 DIAGNOSIS — R1013 Epigastric pain: Secondary | ICD-10-CM

## 2012-10-23 DIAGNOSIS — E119 Type 2 diabetes mellitus without complications: Secondary | ICD-10-CM

## 2012-10-23 DIAGNOSIS — E039 Hypothyroidism, unspecified: Secondary | ICD-10-CM

## 2012-10-23 DIAGNOSIS — C3490 Malignant neoplasm of unspecified part of unspecified bronchus or lung: Secondary | ICD-10-CM

## 2012-10-23 DIAGNOSIS — R5081 Fever presenting with conditions classified elsewhere: Secondary | ICD-10-CM

## 2012-10-23 DIAGNOSIS — I10 Essential (primary) hypertension: Secondary | ICD-10-CM

## 2012-10-23 DIAGNOSIS — R5383 Other fatigue: Secondary | ICD-10-CM

## 2012-10-23 DIAGNOSIS — I4891 Unspecified atrial fibrillation: Secondary | ICD-10-CM

## 2012-10-23 LAB — URINE CULTURE: Colony Count: 6000

## 2012-10-23 LAB — PATHOLOGIST SMEAR REVIEW

## 2012-10-23 LAB — COMPREHENSIVE METABOLIC PANEL
Albumin: 2.6 g/dL — ABNORMAL LOW (ref 3.5–5.2)
BUN: 24 mg/dL — ABNORMAL HIGH (ref 6–23)
Creatinine, Ser: 1.15 mg/dL (ref 0.50–1.35)
GFR calc Af Amer: 66 mL/min — ABNORMAL LOW (ref >90)
Glucose, Bld: 71 mg/dL (ref 70–99)
Total Bilirubin: 1 mg/dL (ref 0.3–1.2)
Total Protein: 5.3 g/dL — ABNORMAL LOW (ref 6.0–8.3)

## 2012-10-23 LAB — CBC
MCV: 87 fL (ref 78.0–100.0)
Platelets: 24 10*3/uL — CL (ref 150–400)
RBC: 2.39 MIL/uL — ABNORMAL LOW (ref 4.22–5.81)
WBC: 0.3 10*3/uL — CL (ref 4.0–10.5)

## 2012-10-23 LAB — GLUCOSE, CAPILLARY
Glucose-Capillary: 73 mg/dL (ref 70–99)
Glucose-Capillary: 100 mg/dL — ABNORMAL HIGH (ref 70–99)

## 2012-10-23 MED ORDER — VANCOMYCIN HCL IN DEXTROSE 1-5 GM/200ML-% IV SOLN
1000.0000 mg | Freq: Two times a day (BID) | INTRAVENOUS | Status: DC
  Administered 2012-10-23 – 2012-10-25 (×5): 1000 mg via INTRAVENOUS
  Filled 2012-10-23 (×6): qty 200

## 2012-10-23 MED ORDER — ONDANSETRON HCL 4 MG/2ML IJ SOLN
4.0000 mg | Freq: Four times a day (QID) | INTRAMUSCULAR | Status: DC | PRN
  Administered 2012-10-23 – 2012-10-24 (×4): 4 mg via INTRAVENOUS
  Filled 2012-10-23 (×4): qty 2

## 2012-10-23 MED ORDER — ACETAMINOPHEN 325 MG PO TABS
650.0000 mg | ORAL_TABLET | ORAL | Status: DC | PRN
  Administered 2012-10-23 (×2): 650 mg via ORAL
  Filled 2012-10-23: qty 2

## 2012-10-23 MED ORDER — RESOURCE THICKENUP CLEAR PO POWD
ORAL | Status: DC | PRN
  Filled 2012-10-23: qty 125

## 2012-10-23 MED ORDER — AMIODARONE HCL IN DEXTROSE 360-4.14 MG/200ML-% IV SOLN
60.0000 mg/h | Freq: Once | INTRAVENOUS | Status: DC
  Filled 2012-10-23: qty 200

## 2012-10-23 MED ORDER — MAGNESIUM SULFATE 40 MG/ML IJ SOLN
2.0000 g | Freq: Once | INTRAMUSCULAR | Status: AC
  Administered 2012-10-23: 2 g via INTRAVENOUS
  Filled 2012-10-23: qty 50

## 2012-10-23 MED ORDER — ENSURE COMPLETE PO LIQD
237.0000 mL | Freq: Three times a day (TID) | ORAL | Status: DC
  Administered 2012-10-23: 237 mL via ORAL

## 2012-10-23 MED ORDER — PANTOPRAZOLE SODIUM 40 MG IV SOLR
40.0000 mg | INTRAVENOUS | Status: DC
  Administered 2012-10-23: 40 mg via INTRAVENOUS
  Filled 2012-10-23 (×3): qty 40

## 2012-10-23 MED ORDER — AMIODARONE LOAD VIA INFUSION
150.0000 mg | Freq: Once | INTRAVENOUS | Status: AC
  Administered 2012-10-24: 150 mg via INTRAVENOUS
  Filled 2012-10-23: qty 83.34

## 2012-10-23 MED ORDER — PANTOPRAZOLE SODIUM 40 MG IV SOLR
40.0000 mg | Freq: Two times a day (BID) | INTRAVENOUS | Status: DC
  Administered 2012-10-23 – 2012-10-28 (×9): 40 mg via INTRAVENOUS
  Filled 2012-10-23 (×11): qty 40

## 2012-10-23 NOTE — Care Management Note (Signed)
    Page 1 of 2   10/28/2012     2:08:10 PM   CARE MANAGEMENT NOTE 10/28/2012  Patient:  Shawn [name], Shawn [name]   Account Number:  [id number]  Date Initiated:  10/23/2012  Documentation initiated by:  Zo Loudon  Subjective/Objective Assessment:   PT ADM ON 10/22/12 WITH SEVERE NEUTROPENIA, ESOPHAGITIS. SUPPORTIVE FAMILY AT BEDSIDE.     Action/Plan:   WILL FOLLOW FOR HOME NEEDS AS PT PROGRESSES.  MAY NEED PT/OT CONSULTS TO DETERMINE HOME NEEDS.   Anticipated DC Date:  10/26/2012   Anticipated DC Plan:  HOME W HOME HEALTH SERVICES      DC Planning Services  CM consult      [facility] Choice  HOME HEALTH   Choice offered to / List presented to:  C-2 HC POA / Guardian        HH arranged  HH-2 PT  HH-3 OT      [facility] agency  [facility]   Status of service:  Completed, signed off Medicare Important Message given?   (If response is "NO", the following Medicare IM given date fields will be blank) Date Medicare IM given:   Date Additional Medicare IM given:    Discharge Disposition:  HOME W HOME HEALTH SERVICES  Per UR Regulation:  Reviewed for med. necessity/level of care/duration of stay  If discussed at Long Length of Stay Meetings, dates discussed:    Comments:  10/28/12 Rosalita Chessman [phone] PT FOR DC HOME TODAY.  MET WITH PT AND SONS AT BEDSIDE.  PT IS ACTIVE WITH COMFORT KEEPERS FOR 24HR [facility] AIDES AT HOME. PT AGREEABLE TO HHPT AND OT AT DC; REFERRAL TO [facility], PER CHOICE.  START OF CARE 24-48H POST DC DATE.   CONTACT #S:  KEENA, HEESCH                                     [place]  [phone]

## 2012-10-23 NOTE — Progress Notes (Signed)
[facility] PROGRESS NOTE  Shawn [name] UJW:[id number] DOB: 1928/08/14 DOA: 10/22/2012 PCP: Minda Meo, MD  Assessment/Plan: 1-Neutropenic Fever:continue vanc and cefepime; no further fever episodes; cx's pending but w/o any growth up to date.  2-pancytopenia: secondary to chemotherapy tx. Will follow cell line trend and transfuse as needed; no signs of overt bleeding. Will hold xarelto and avoid heparin products. Oncology on board, will follow rec's.  3-HTN: stable, continue current regimen  4-DM: continue SSI  5-hypothyroidism: continue synthroid  6-GERD: continue PPI  7-protein calorie malnutrition (moderate to severe): will follow dietician rec's; started on Ensure TID  8-esophagitis: continue PPI and carafate  9-deconditioning: will ask PT/OT to evaluate patient.  Code Status: Full Family Communication: son at bedside Disposition Plan: home when medically stable; unless unsafe; will follow PT/OT rec's.   Consultants:  oncology  Procedures:  See below for x-ray report  Antibiotics:  Cefepime and vanc 1/30  HPI/Subjective: Afebrile, no CP or SOB.  Objective: Filed Vitals:   10/23/12 0042 10/23/12 0404 10/23/12 0748 10/23/12 1343  BP:  115/56 96/60 96/46   Pulse:  110 103 84  Temp: 99.5 F (37.5 C) 99.2 F (37.3 C) 98 F (36.7 C) 97.9 F (36.6 C)  TempSrc:  Oral Oral Axillary  Resp:  20 20 18   Weight:  84.3 kg (185 lb 13.6 oz)    SpO2:  95% 93% 96%    Intake/Output Summary (Last 24 hours) at 10/23/12 1628 Last data filed at 10/23/12 1300  Gross per 24 hour  Intake      0 ml  Output      0 ml  Net      0 ml   Filed Weights   10/23/12 0404  Weight: 84.3 kg (185 lb 13.6 oz)    Exam:   General:  NAD, afebrile; feeling slightly better  Cardiovascular: S1 and S2, no rubs or gallops  Respiratory: no wheezing, no crackles  Abdomen: soft, NT, ND, positive BS  Neuro: non focal deficit  Data Reviewed: Basic Metabolic  Panel:  Lab 10/23/12 0500 10/22/12 1515  NA 137 136  K 3.5 4.9  CL 106 103  CO2 23 20  GLUCOSE 71 170*  BUN 24* 23  CREATININE 1.15 1.12  CALCIUM 8.4 9.3  MG -- --  PHOS -- --   Liver Function Tests:  Lab 10/23/12 0500 10/22/12 1515  AST 13 20  ALT 11 15  ALKPHOS 73 98  BILITOT 1.0 1.4*  PROT 5.3* 6.6  ALBUMIN 2.6* 3.4*   CBC:  Lab 10/23/12 0500 10/22/12 1515  WBC 0.3* 0.4*  NEUTROABS -- TOO FEW TO COUNT, SMEAR AVAILABLE FOR REVIEW  HGB 7.2* 9.8*  HCT 20.8* 29.0*  MCV 87.0 87.9  PLT 24* 45*   BNP (last 3 results)  Basename 07/29/12 1207  PROBNP 656.4*   CBG:  Lab 10/23/12 1621 10/23/12 1123 10/23/12 0711 10/23/12 0634  GLUCAP 65* 100* 73 58*    Recent Results (from the past 240 hour(s))  CULTURE, BLOOD (ROUTINE X 2)     Status: Normal (Preliminary result)   Collection Time   10/22/12  5:04 PM      Component Value Range Status Comment   Specimen Description BLOOD HAND LEFT   Final    Special Requests BOTTLES DRAWN AEROBIC ONLY 4CC   Final    Culture  Setup Time 10/22/2012 22:17   Final    Culture     Final    Value:  BLOOD CULTURE RECEIVED NO GROWTH TO DATE CULTURE WILL BE HELD FOR 5 DAYS BEFORE ISSUING A FINAL NEGATIVE REPORT   Report Status PENDING   Incomplete   URINE CULTURE     Status: Normal   Collection Time   10/22/12  5:07 PM      Component Value Range Status Comment   Specimen Description URINE, CLEAN CATCH   Final    Special Requests NONE   Final    Culture  Setup Time 10/22/2012 18:17   Final    Colony Count 6,000 COLONIES/ML   Final    Culture INSIGNIFICANT GROWTH   Final    Report Status 10/23/2012 FINAL   Final   CULTURE, BLOOD (ROUTINE X 2)     Status: Normal (Preliminary result)   Collection Time   10/22/12  5:10 PM      Component Value Range Status Comment   Specimen Description BLOOD HAND RIGHT   Final    Special Requests BOTTLES DRAWN AEROBIC ONLY 2CC   Final    Culture  Setup Time 10/22/2012 22:17   Final    Culture      Final    Value:        BLOOD CULTURE RECEIVED NO GROWTH TO DATE CULTURE WILL BE HELD FOR 5 DAYS BEFORE ISSUING A FINAL NEGATIVE REPORT   Report Status PENDING   Incomplete      Studies: Dg Chest Port 1 View  10/22/2012  *RADIOLOGY REPORT*  Clinical Data: Weakness, shortness of breath.  PORTABLE CHEST - 1 VIEW  Comparison: 08/04/2012  Findings: The prior CABG.  Mild hyperinflation/COPD.  Right Port-A- Cath is in place with the tip in the lower SVC.  Calcified granuloma in the right upper lobe.  Previously seen left suprahilar density not well appreciated by plain film.  No effusions.  No acute bony abnormality.  IMPRESSION: Hyperinflation/COPD.  Left suprahilar nodular density seen on prior CT not well appreciated on plain film.   Original Report Authenticated By: Charlett Nose, M.D.     Scheduled Meds:   . ceFEPime (MAXIPIME) IV  2 g Intravenous Q8H  . insulin aspart  0-9 Units Subcutaneous TID WC  . levothyroxine  75 mcg Oral QAC breakfast  . Linaclotide  145 mcg Oral QODAY  . lisinopril  10 mg Oral q morning - 10a  . pantoprazole (PROTONIX) IV  40 mg Intravenous Q24H  . potassium chloride  10 mEq Oral q morning - 10a  . prochlorperazine  10 mg Oral QHS  . senna  1 tablet Oral BID  . sucralfate  1 g Oral QID  . vancomycin  1,000 mg Intravenous Q12H   Continuous Infusions:   . sodium chloride 75 mL/hr at 10/22/12 2042    Time spent: >30 minutes    Kendahl Bumgardner  [facility] Pager [phone]. If 8PM-8AM, please contact night-coverage at [email or web address], password [facility] 10/23/2012, 4:28 PM  LOS: 1 day

## 2012-10-23 NOTE — Progress Notes (Signed)
Pt. Would like to try to eat supper, then recheck CBG.  Currently 65.  Will f/u after supper per pt and family request. [address]

## 2012-10-24 DIAGNOSIS — I4891 Unspecified atrial fibrillation: Secondary | ICD-10-CM

## 2012-10-24 DIAGNOSIS — T451X5A Adverse effect of antineoplastic and immunosuppressive drugs, initial encounter: Secondary | ICD-10-CM

## 2012-10-24 DIAGNOSIS — D702 Other drug-induced agranulocytosis: Secondary | ICD-10-CM

## 2012-10-24 LAB — CBC WITH DIFFERENTIAL/PLATELET
Band Neutrophils: 0 % (ref 0–10)
Basophils Relative: 0 % (ref 0–1)
HCT: 27.1 % — ABNORMAL LOW (ref 39.0–52.0)
Hemoglobin: 9.4 g/dL — ABNORMAL LOW (ref 13.0–17.0)
Lymphs Abs: 0 10*3/uL — ABNORMAL LOW (ref 0.7–4.0)
MCH: 30 pg (ref 26.0–34.0)
MCHC: 34.7 g/dL (ref 30.0–36.0)
Monocytes Absolute: 0 10*3/uL — ABNORMAL LOW (ref 0.1–1.0)
nRBC: 0 /100 WBC

## 2012-10-24 LAB — BASIC METABOLIC PANEL
BUN: 27 mg/dL — ABNORMAL HIGH (ref 6–23)
Chloride: 105 mEq/L (ref 96–112)
GFR calc Af Amer: 67 mL/min — ABNORMAL LOW (ref >90)
Glucose, Bld: 174 mg/dL — ABNORMAL HIGH (ref 70–99)
Potassium: 3.4 mEq/L — ABNORMAL LOW (ref 3.5–5.1)

## 2012-10-24 LAB — GLUCOSE, CAPILLARY: Glucose-Capillary: 86 mg/dL (ref 70–99)

## 2012-10-24 LAB — PREPARE RBC (CROSSMATCH)

## 2012-10-24 MED ORDER — BOOST / RESOURCE BREEZE PO LIQD
1.0000 | Freq: Three times a day (TID) | ORAL | Status: DC
  Administered 2012-10-24 – 2012-10-27 (×3): 1 via ORAL

## 2012-10-24 MED ORDER — POTASSIUM CHLORIDE CRYS ER 20 MEQ PO TBCR
20.0000 meq | EXTENDED_RELEASE_TABLET | Freq: Every morning | ORAL | Status: DC
  Administered 2012-10-25 – 2012-10-28 (×4): 20 meq via ORAL
  Filled 2012-10-24 (×4): qty 1

## 2012-10-24 MED ORDER — AMIODARONE HCL IN DEXTROSE 360-4.14 MG/200ML-% IV SOLN
30.0000 mg/h | INTRAVENOUS | Status: DC
  Administered 2012-10-24 – 2012-10-26 (×4): 30 mg/h via INTRAVENOUS
  Filled 2012-10-24 (×10): qty 200

## 2012-10-24 MED ORDER — BOOST PLUS PO LIQD
237.0000 mL | Freq: Three times a day (TID) | ORAL | Status: DC
  Administered 2012-10-26 – 2012-10-28 (×4): 237 mL via ORAL
  Filled 2012-10-24 (×17): qty 237

## 2012-10-24 MED ORDER — POTASSIUM CHLORIDE 10 MEQ/100ML IV SOLN
10.0000 meq | INTRAVENOUS | Status: AC
  Administered 2012-10-24 (×4): 10 meq via INTRAVENOUS
  Filled 2012-10-24 (×4): qty 100

## 2012-10-24 MED ORDER — AMIODARONE HCL IN DEXTROSE 360-4.14 MG/200ML-% IV SOLN
60.0000 mg/h | INTRAVENOUS | Status: AC
  Administered 2012-10-24: 60 mg/h via INTRAVENOUS
  Filled 2012-10-24: qty 200

## 2012-10-24 MED ORDER — MAGNESIUM SULFATE 40 MG/ML IJ SOLN
2.0000 g | Freq: Once | INTRAMUSCULAR | Status: AC
  Administered 2012-10-24: 2 g via INTRAVENOUS
  Filled 2012-10-24: qty 50

## 2012-10-24 NOTE — Progress Notes (Signed)
[facility] PROGRESS NOTE  Shawn DAISEY [name]:[id number] DOB: 04-Jun-1928 DOA: 10/22/2012 PCP: Minda Meo, MD  Assessment/Plan: 1-Neutropenic Fever:continue vanc and cefepime; no further fever episodes; cx's pending but w/o any growth up to date.  2-pancytopenia: secondary to chemotherapy tx. Will follow cell line trend and transfuse as needed; no signs of overt bleeding. Will continue holding xarelto and avoid heparin products. Oncology on board, will follow rec's. 1 unit of PRBC's given so far, Hgn 9.2 now. Platelets 16,000  3-HTN: stable, continue current regimen  4-DM: continue SSI  5-hypothyroidism: continue synthroid  6-GERD: continue PPI  7-protein calorie malnutrition (moderate to severe): will follow dietician rec's; continue Ensure TID. Patient has been encouraged to eat and the plan is to reassess on Monday; after discussing with family members and patient itself he has expressed that his nutrition and will be accomplished by mouth he will like to have a peg tube placed.  8-esophagitis: continue PPI and carafate  9-deconditioning: will ask PT/OT to evaluate patient; patient have 24 hour services for assistance and care and will like to go home at discharge. Will assess to ensure that he did not require any further home health services.  10-atrial fibrillation with SVTs: Combivent at overnight to sinus rate in; patient require amiodarone infusion. Also one unit of PRBC was given hemoglobin stable. Will replete electrolytes and continue monitoring on telemetry. If needed will start patient on low-dose beta blocker. Continue holding xarelto.  DVT: SCD's  Code Status: Full Family Communication: son at bedside Disposition Plan: home when medically stable; unless unsafe; will follow PT/OT rec's.   Consultants:  oncology  Procedures:  See below for x-ray report  Antibiotics:  Cefepime and vanc 1/30  HPI/Subjective: Afebrile, no CP or SOB. No overt bleeding.  Complaining of epigastric discomfort. No appetite. Very weak  Objective: Filed Vitals:   10/24/12 0337 10/24/12 0425 10/24/12 0441 10/24/12 0600  BP: 123/61  115/52 138/68  Pulse: 91  94 85  Temp: 98.4 F (36.9 C)  97.5 F (36.4 C) 97.9 F (36.6 C)  TempSrc: Oral  Oral Oral  Resp: 18  18 18   Height:      Weight:  85.4 kg (188 lb 4.4 oz)    SpO2:    98%    Intake/Output Summary (Last 24 hours) at 10/24/12 1030 Last data filed at 10/24/12 0600  Gross per 24 hour  Intake    775 ml  Output    950 ml  Net   -175 ml   Filed Weights   10/23/12 0404 10/24/12 0425  Weight: 84.3 kg (185 lb 13.6 oz) 85.4 kg (188 lb 4.4 oz)    Exam:   General:  NAD, afebrile; feeling weak; no shortness of breath  Cardiovascular: S1 and S2, no rubs or gallops; rate control and sinus per telemetry  Respiratory: no wheezing, no crackles  Abdomen: soft, NT, ND, positive BS  Neuro: non focal deficit  Data Reviewed: Basic Metabolic Panel:  Lab 10/24/12 8657 10/23/12 0500 10/22/12 1515  NA 136 137 136  K 3.4* 3.5 4.9  CL 105 106 103  CO2 19 23 20   GLUCOSE 174* 71 170*  BUN 27* 24* 23  CREATININE 1.14 1.15 1.12  CALCIUM 8.5 8.4 9.3  MG 1.7 -- --  PHOS -- -- --   Liver Function Tests:  Lab 10/23/12 0500 10/22/12 1515  AST 13 20  ALT 11 15  ALKPHOS 73 98  BILITOT 1.0 1.4*  PROT 5.3* 6.6  ALBUMIN 2.6*  3.4*   CBC:  Lab 10/24/12 0630 10/23/12 0500 10/22/12 1515  WBC 0.7* 0.3* 0.4*  NEUTROABS -- -- TOO FEW TO COUNT, SMEAR AVAILABLE FOR REVIEW  HGB 9.4* 7.2* 9.8*  HCT 27.1* 20.8* 29.0*  MCV 86.6 87.0 87.9  PLT 16* 24* 45*   BNP (last 3 results)  Basename 07/29/12 1207  PROBNP 656.4*   CBG:  Lab 10/24/12 0548 10/23/12 2245 10/23/12 2148 10/23/12 1621 10/23/12 1123  GLUCAP 140* 102* 59* 65* 100*    Recent Results (from the past 240 hour(s))  CULTURE, BLOOD (ROUTINE X 2)     Status: Normal (Preliminary result)   Collection Time   10/22/12  5:04 PM      Component Value  Range Status Comment   Specimen Description BLOOD HAND LEFT   Final    Special Requests BOTTLES DRAWN AEROBIC ONLY 4CC   Final    Culture  Setup Time 10/22/2012 22:17   Final    Culture     Final    Value:        BLOOD CULTURE RECEIVED NO GROWTH TO DATE CULTURE WILL BE HELD FOR 5 DAYS BEFORE ISSUING A FINAL NEGATIVE REPORT   Report Status PENDING   Incomplete   URINE CULTURE     Status: Normal   Collection Time   10/22/12  5:07 PM      Component Value Range Status Comment   Specimen Description URINE, CLEAN CATCH   Final    Special Requests NONE   Final    Culture  Setup Time 10/22/2012 18:17   Final    Colony Count 6,000 COLONIES/ML   Final    Culture INSIGNIFICANT GROWTH   Final    Report Status 10/23/2012 FINAL   Final   CULTURE, BLOOD (ROUTINE X 2)     Status: Normal (Preliminary result)   Collection Time   10/22/12  5:10 PM      Component Value Range Status Comment   Specimen Description BLOOD HAND RIGHT   Final    Special Requests BOTTLES DRAWN AEROBIC ONLY 2CC   Final    Culture  Setup Time 10/22/2012 22:17   Final    Culture     Final    Value:        BLOOD CULTURE RECEIVED NO GROWTH TO DATE CULTURE WILL BE HELD FOR 5 DAYS BEFORE ISSUING A FINAL NEGATIVE REPORT   Report Status PENDING   Incomplete      Studies: Dg Chest Port 1 View  10/22/2012  *RADIOLOGY REPORT*  Clinical Data: Weakness, shortness of breath.  PORTABLE CHEST - 1 VIEW  Comparison: 08/04/2012  Findings: The prior CABG.  Mild hyperinflation/COPD.  Right Port-A- Cath is in place with the tip in the lower SVC.  Calcified granuloma in the right upper lobe.  Previously seen left suprahilar density not well appreciated by plain film.  No effusions.  No acute bony abnormality.  IMPRESSION: Hyperinflation/COPD.  Left suprahilar nodular density seen on prior CT not well appreciated on plain film.   Original Report Authenticated By: Charlett Nose, M.D.     Scheduled Meds:    . ceFEPime (MAXIPIME) IV  2 g Intravenous Q8H   . feeding supplement  237 mL Oral TID BM  . insulin aspart  0-9 Units Subcutaneous TID WC  . levothyroxine  75 mcg Oral QAC breakfast  . Linaclotide  145 mcg Oral QODAY  . lisinopril  10 mg Oral q morning - 10a  . pantoprazole (PROTONIX)  IV  40 mg Intravenous Q12H  . potassium chloride  20 mEq Oral q morning - 10a  . prochlorperazine  10 mg Oral QHS  . senna  1 tablet Oral BID  . sucralfate  1 g Oral QID  . vancomycin  1,000 mg Intravenous Q12H   Continuous Infusions:    . sodium chloride 75 mL/hr at 10/22/12 2042  . amiodarone (NEXTERONE PREMIX) 360 mg/200 mL dextrose 30 mg/hr (10/24/12 0800)    Time spent: >30 minutes    Monice Lundy  [facility] Pager [phone]. If 8PM-8AM, please contact night-coverage at [email or web address], password [facility] 10/24/2012, 10:30 AM  LOS: 2 days

## 2012-10-24 NOTE — Progress Notes (Signed)
Monitor tech instructs the nurse to go in and check on the patient, due to the fact that he was experiencing several runs of V-Tach. The nurse as well as others converge into the patient's room, and find that the patient was asymptomatic, however, he was still experiencing V-Tach. The nurse pages the PA Daphane Shepherd) covering for C. Madera. Due to the patient continuing to experience V-Tach, the nurse calls a rapid response. April presented to the floor and asked for a ECG. An ECD was performed that showed SVT with PVC's. April disagreed with the ECG print out and identified the rhythm as V-Tach. Daphane Shepherd presents to the unit and orders a magnesium run. She also orders Amiodarone, however, later decided to just run the mag and see how he does. She gave instructions that if the mag does not suffice, and the patient again experiences runs of V-Tach, give a bolus of Amio and begin Amio drip. The nurse complied with the instructions. Harmon Pier

## 2012-10-24 NOTE — Consult Note (Addendum)
[facility] INPATIENT PROGRESS NOTE  Name: AKI ABALOS      MRN: [id number]    Location: 2025/2025-01  Date: 10/24/2012 Time:2:23 PM   Subjective: Interval History:Guillermo A Schneck was seen with his son present in the room.  He reported substernal chest pain when he swallows.  He had not been able to eat or drink much at home.  He was very weak; prompting him to present to ED.  He has mild productive cough with some blood-tinged sputum.  He denied fever, SOB, abdominal pain, GI bleed, skin rash.   Objective: Vital signs in last 24 hours: Temp:  [97.5 F (36.4 C)-98.4 F (36.9 C)] 97.9 F (36.6 C) (02/01 0600) Pulse Rate:  [85-94] 85  (02/01 0600) Resp:  [18] 18  (02/01 0600) BP: (106-138)/(48-68) 138/68 mmHg (02/01 0600) SpO2:  [92 %-98 %] 96 % (02/01 1315) Weight:  [188 lb 4.4 oz (85.4 kg)] 188 lb 4.4 oz (85.4 kg) (02/01 0425)      PHYSICAL EXAM:  Gen: thin-appearing man, in no acute distress. Eyes: No scleral icterus or jaundice. ENT: There was no oropharyngeal lesions. Neck was supple without thyromegaly. Lymphatics: Negative for cervical, supraclavicular, axillary, or inguinal adenopathy.  Respiratory: Lungs were clear bilaterally without wheezing or crackles. Cardiovascular: normal heart rate and rhythm; S1/S2; without murmur, rubs, or gallop. There was no pedal edema. GI: Abdomen was soft, flat, nontender, nondistended, without organomegaly. Musculoskeletal exam: No spinal tenderness on palpation of vertebral spine. Skin exam was without ecchymosis on the extensor surfaces of bilateral arms.  There was an erythematous patch of skin on the left back from radiation without desquamation.  Neuro exam was nonfocal.    Studies/Results: WBC 0.7; Hgb 9.4; plt 16 Cr 1.14.  I personally reviewed the patient's peripheral blood smear today.  There was anisocytosis.  There was no peripheral blast.  There was no schistocytosis, spherocytosis, target cell, rouleaux formation, tear  drop cell.  There was no giant platelets or platelet clumps.     Dg Chest Port 1 View  10/22/2012  *RADIOLOGY REPORT*  Clinical Data: Weakness, shortness of breath.  PORTABLE CHEST - 1 VIEW  Comparison: 08/04/2012  Findings: The prior CABG.  Mild hyperinflation/COPD.  Right Port-A- Cath is in place with the tip in the lower SVC.  Calcified granuloma in the right upper lobe.  Previously seen left suprahilar density not well appreciated by plain film.  No effusions.  No acute bony abnormality.  IMPRESSION: Hyperinflation/COPD.  Left suprahilar nodular density seen on prior CT not well appreciated on plain film.   Original Report Authenticated By: Charlett Nose, M.D.      MEDICATIONS: reviewed.     PROBLEM LIST:   1.  Small cell lung cancer:  S/p concurrent chemoradiation.  S/p 3 cycles of carboplatin/Etoposide with las chemo on 10/15/12. Chemo on hold until acute issues resolve.  2.  Pancytopenia:  Due to recent chemo. Given his age, it may take him 2-3 weeks from chemo to recover.  I have low clinical suspicion for primary bone marrow issue.  3.  Neutropenic fever. 4.  Radiation-induced esophagitis.  5.  Hypothyroidism. 6.  Moderate calorie/protein malnutrition. 7.  Diabetes mellitus  RECOMMENDATION:   - Agree with empiric antibiotics Cefepime and Vancomycin.  Cultures still negative to date.  After 3-5 days, if cultures still negative, would encourage switching to oral empiric regimen. - Follow daily CBC and transfuse for Hgb <7 or plt <10 or active bleeding. - Agree with  Carafate and Protonix.  I advised patient to take pain med before swallowing.  It may take about 3 weeks from finish of radiation to have better symptoms of esophagitis.

## 2012-10-24 NOTE — Progress Notes (Signed)
Monitor tech informs the nurse that the patient again has had to runs of V-Tach, however, the patient continues to go in and out of V-Tach to Sinus Tach. Harmon Pier

## 2012-10-24 NOTE — Progress Notes (Signed)
Subjective: Interval History: patient experiencing runs of wide complex tachycardia. Reports chest pain/pressure at the time this occurred. Alert, oriented, talking the entire time. Slightly hypotensive (98/58). No dyspnea or decrease in oxygen saturation.  Objective: Vital signs in last 24 hours: Temp:  [97.7 F (36.5 C)-99.5 F (37.5 C)] 97.7 F (36.5 C) (01/31 1931) Pulse Rate:  [84-110] 89  (01/31 1931) Resp:  [18-20] 18  (01/31 1931) BP: (96-115)/(46-60) 113/57 mmHg (01/31 1931) SpO2:  [92 %-96 %] 92 % (01/31 1931) Weight:  [84.3 kg (185 lb 13.6 oz)] 84.3 kg (185 lb 13.6 oz) (01/31 0404)  Intake/Output from previous day: 01/31 0701 - 02/01 0700 In: 50 [P.O.:50] Out: 425 [Urine:425] Intake/Output this shift: Total I/O In: -  Out: 425 [Urine:425]  BP 113/57  Pulse 89  Temp 97.7 F (36.5 C) (Oral)  Resp 18  Wt 84.3 kg (185 lb 13.6 oz)  SpO2 92% General appearance: alert, cooperative and no distress Heart: wide complex tachycardia - see EKG  Results for orders placed during the hospital encounter of 10/22/12 (from the past 24 hour(s))  COMPREHENSIVE METABOLIC PANEL     Status: Abnormal   Collection Time   10/23/12  5:00 AM      Component Value Range   Sodium 137  135 - 145 mEq/L   Potassium 3.5  3.5 - 5.1 mEq/L   Chloride 106  96 - 112 mEq/L   CO2 23  19 - 32 mEq/L   Glucose, Bld 71  70 - 99 mg/dL   BUN 24 (*) 6 - 23 mg/dL   Creatinine, Ser 4.54  0.50 - 1.35 mg/dL   Calcium 8.4  8.4 - 09.8 mg/dL   Total Protein 5.3 (*) 6.0 - 8.3 g/dL   Albumin 2.6 (*) 3.5 - 5.2 g/dL   AST 13  0 - 37 U/L   ALT 11  0 - 53 U/L   Alkaline Phosphatase 73  39 - 117 U/L   Total Bilirubin 1.0  0.3 - 1.2 mg/dL   GFR calc non Af Amer 57 (*) >90 mL/min   GFR calc Af Amer 66 (*) >90 mL/min  CBC     Status: Abnormal   Collection Time   10/23/12  5:00 AM      Component Value Range   WBC 0.3 (*) 4.0 - 10.5 K/uL   RBC 2.39 (*) 4.22 - 5.81 MIL/uL   Hemoglobin 7.2 (*) 13.0 - 17.0 g/dL   HCT  11.9 (*) 14.7 - 52.0 %   MCV 87.0  78.0 - 100.0 fL   MCH 30.1  26.0 - 34.0 pg   MCHC 34.6  30.0 - 36.0 g/dL   RDW 82.9 (*) 56.2 - 13.0 %   Platelets 24 (*) 150 - 400 K/uL  GLUCOSE, CAPILLARY     Status: Abnormal   Collection Time   10/23/12  6:34 AM      Component Value Range   Glucose-Capillary 58 (*) 70 - 99 mg/dL  GLUCOSE, CAPILLARY     Status: Normal   Collection Time   10/23/12  7:11 AM      Component Value Range   Glucose-Capillary 73  70 - 99 mg/dL  GLUCOSE, CAPILLARY     Status: Abnormal   Collection Time   10/23/12 11:23 AM      Component Value Range   Glucose-Capillary 100 (*) 70 - 99 mg/dL   Comment 1 Notify RN    GLUCOSE, CAPILLARY     Status: Abnormal  Collection Time   10/23/12  4:21 PM      Component Value Range   Glucose-Capillary 65 (*) 70 - 99 mg/dL  GLUCOSE, CAPILLARY     Status: Abnormal   Collection Time   10/23/12  9:48 PM      Component Value Range   Glucose-Capillary 59 (*) 70 - 99 mg/dL  GLUCOSE, CAPILLARY     Status: Abnormal   Collection Time   10/23/12 10:45 PM      Component Value Range   Glucose-Capillary 102 (*) 70 - 99 mg/dL    Studies/Results: Ct Chest W Contrast  09/30/2012  *RADIOLOGY REPORT*  Clinical Data: Lung cancer with ongoing chemotherapy and radiation therapy.  History of prostate cancer status post prostatectomy.  CT CHEST WITH CONTRAST  Technique:  Multidetector CT imaging of the chest was performed following the standard protocol during bolus administration of intravenous contrast.  Contrast: 80mL OMNIPAQUE IOHEXOL 300 MG/ML  SOLN  Comparison: PET CT from 06/29/2012  Findings: There is no axillary lymphadenopathy.  No mediastinal or hilar lymphadenopathy.  The patient is status post CABG no pericardial or pleural effusion.  Lung windows show changes of emphysema.  The left suprahilar region has decreased in size in the interval. Measuring at the same level and in the same dimensions as the previous study, the left suprahilar lesion has  decreased from 2.3 x 1.7 cm on the previous study to 2.0 x 1.0 cm on the current exam.  The peripheral 13 x 13 mm focus of ground-glass attenuation in the posterolateral left upper lobe has nearly resolved.  A new 13 x 13 mm ground-glass opacities seen in the posterior right lower lobe (image 39).  Bone windows reveal no worrisome lytic or sclerotic osseous lesions.  IMPRESSION: Interval decrease in size of the left suprahilar lesion with near complete resolution of the previously identified ground-glass attenuation in the posterior left upper lobe.  New 13 mm focus of ground-glass density in the posterior right lower lobe. This is probably related to post infectious or post inflammatory alveolitis.  Continued attention on follow-up imaging recommended.   Original Report Authenticated By: Kennith Center, M.D.    Dg Chest Port 1 View  10/22/2012  *RADIOLOGY REPORT*  Clinical Data: Weakness, shortness of breath.  PORTABLE CHEST - 1 VIEW  Comparison: 08/04/2012  Findings: The prior CABG.  Mild hyperinflation/COPD.  Right Port-A- Cath is in place with the tip in the lower SVC.  Calcified granuloma in the right upper lobe.  Previously seen left suprahilar density not well appreciated by plain film.  No effusions.  No acute bony abnormality.  IMPRESSION: Hyperinflation/COPD.  Left suprahilar nodular density seen on prior CT not well appreciated on plain film.   Original Report Authenticated By: Charlett Nose, M.D.     Scheduled Meds:   . amiodarone  150 mg Intravenous Once  . ceFEPime (MAXIPIME) IV  2 g Intravenous Q8H  . feeding supplement  237 mL Oral TID BM  . insulin aspart  0-9 Units Subcutaneous TID WC  . levothyroxine  75 mcg Oral QAC breakfast  . Linaclotide  145 mcg Oral QODAY  . lisinopril  10 mg Oral q morning - 10a  . magnesium sulfate 1 - 4 g bolus IVPB  2 g Intravenous Once  . pantoprazole (PROTONIX) IV  40 mg Intravenous Q12H  . potassium chloride  10 mEq Oral q morning - 10a  .  prochlorperazine  10 mg Oral QHS  . senna  1 tablet  Oral BID  . sucralfate  1 g Oral QID  . vancomycin  1,000 mg Intravenous Q12H   Continuous Infusions:   . sodium chloride 75 mL/hr at 10/22/12 2042   PRN Meds:acetaminophen, morphine injection, ondansetron, RESOURCE THICKENUP CLEAR  Assessment/Plan: Wide complex tachycardia with a pulse. HR 180's. Ordered BMP, Mg++ and started 2 gm IV magnesium. Patient converted spontaneously to sinus tachycardia, with intermittent bigeminy, frequent PVC's. Will plan to give amiodarone if he has further symptomatic sustained episodes. Transfuse 1 unit of PRBC for Hgb 7.2 gm/dL.   LOS: 2 days   Andruw Battie A.

## 2012-10-24 NOTE — Evaluation (Signed)
Physical Therapy Evaluation Patient Details Name: Shawn [name] MRN: [id number] DOB: [birth year] Today's Date: 10/24/2012 Time: 1026-1100 PT Time Calculation (min): 34 min  PT Assessment / Plan / Recommendation Clinical Impression  Pt s/p Lung CA with dehydration and runs of VTach.  Will benefit from PT to address endurance and balance issues.  Will need to obtain a 3N1 for home as well as HHPT f/u.  Has other equipment and sons capable of providing care at 24 hours in the home.      PT Assessment  Patient needs continued PT services    Follow Up Recommendations  Home health PT;Supervision/Assistance - 24 hour                Equipment Recommendations   (3N1)         Frequency Min 3X/week    Precautions / Restrictions Precautions Precautions: Fall Restrictions Weight Bearing Restrictions: No   Pertinent Vitals/Pain VSS, some pain      Mobility  Bed Mobility Bed Mobility: Not assessed Transfers Transfers: Sit to Stand;Stand to Sit Sit to Stand: 4: Min assist;With upper extremity assist;With armrests;From chair/3-in-1 Stand to Sit: 4: Min assist;With upper extremity assist;With armrests;To chair/3-in-1 Details for Transfer Assistance: cues needed for hand placement, assist needed to control descent as pt "plopped" Ambulation/Gait Ambulation/Gait Assistance: 4: Min assist Ambulation Distance (Feet): 200 Feet Assistive device: Rolling walker Ambulation/Gait Assistance Details: Pt needed greater assist and cues as he fatigued.  Overall gait steady with RW with occasional cues for staying close to RW as well as to sequence steps appropriately.  Needed cues to stand tall as well.  Decr safety with RW once pt back in room and fatigued and rushing to chair needing cues to slow down and be safe.   Gait Pattern: Step-through pattern;Decreased stride length;Trunk flexed Stairs: No Wheelchair Mobility Wheelchair Mobility: No              PT Diagnosis: Generalized weakness   PT Problem List: Decreased balance;Decreased activity tolerance;Decreased mobility;Decreased safety awareness;Decreased knowledge of use of DME;Decreased strength PT Treatment Interventions: DME instruction;Gait training;Stair training;Functional mobility training;Therapeutic activities;Therapeutic exercise;Balance training;Patient/family education   PT Goals Acute Rehab PT Goals PT Goal Formulation: With patient Time For Goal Achievement: 11/07/12 Potential to Achieve Goals: Good Pt will go Supine/Side to Sit: with supervision PT Goal: Supine/Side to Sit - Progress: Goal set today Pt will go Sit to Stand: Independently PT Goal: Sit to Stand - Progress: Goal set today Pt will Transfer Bed to Chair/Chair to Bed: Independently PT Transfer Goal: Bed to Chair/Chair to Bed - Progress: Goal set today Pt will Ambulate: 51 - 150 feet;with supervision;with least restrictive assistive device PT Goal: Ambulate - Progress: Goal set today Pt will Go Up / Down Stairs: with min assist;Flight;with least restrictive assistive device PT Goal: Up/Down Stairs - Progress: Goal set today Pt will Perform Home Exercise Program: with supervision, verbal cues required/provided PT Goal: Perform Home Exercise Program - Progress: Goal set today  Visit Information  Last PT Received On: 10/24/12 Assistance Needed: +2    Subjective Data  Subjective: "I feel very weak.  I don't know if I can walk." Patient Stated Goal: To go home with family   Prior Functioning  Home Living Lives With: Son Available Help at Discharge: Family;Available 24 hours/day Type of Home: House Home Access: Stairs to enter [employer] of Steps: 3 Entrance Stairs-Rails: Right Home Layout: Two level;1/2 bath on main level;Bed/bath upstairs Alternate Level Stairs-Number of Steps: flight Alternate  Level Stairs-Rails: Right Bathroom Shower/Tub: Health visitor: Standard Home Adaptive Equipment: Walker -  rolling;Wheelchair - manual Prior Function Level of Independence: Independent Able to Take Stairs?: Yes Driving: No Vocation: Retired Musician: No difficulties    Cognition  Overall Cognitive Status: Appears within functional limits for tasks assessed/performed Arousal/Alertness: Awake/alert Orientation Level: Appears intact for tasks assessed Behavior During Session: [facility] for tasks performed    Extremity/Trunk Assessment Right Lower Extremity Assessment RLE ROM/Strength/Tone: [facility] for tasks assessed Left Lower Extremity Assessment LLE ROM/Strength/Tone: [facility] for tasks assessed Trunk Assessment Trunk Assessment: Normal   Balance Static Standing Balance Static Standing - Balance Support: Bilateral upper extremity supported;During functional activity Static Standing - Level of Assistance: 4: Min assist Static Standing - Comment/# of Minutes: Pt min guard assist with static stance holding onto RW for stability up to 2 minutes  End of Session PT - End of Session Equipment Utilized During Treatment: Gait belt Activity Tolerance: Patient limited by fatigue Patient left: in chair;with call bell/phone within reach;with family/visitor present Nurse Communication: Mobility status       INGOLD,Kenyanna Grzesiak 10/24/2012, 1:32 PM  [facility] Acute Rehabilitation [phone] (pager)

## 2012-10-24 NOTE — Progress Notes (Signed)
INITIAL NUTRITION ASSESSMENT  DOCUMENTATION CODES Per approved criteria  -Severe malnutrition in the context of chronic illness   Pt meets criteria for SEVERE MALNUTRITION in the context of chronic illness as evidenced by 8% weight loss x 1 month and very poor intake < 75% of his needs in > 1 month.   INTERVENTION: Discussed with pt strategies for increasing intake.  Magic cup TID between meals, each supplement provides 290 kcal and 9 grams of protein. Resource Breeze po TID, each supplement provides 250 kcal and 9 grams of protein. Boost Plus po TID, each supplement provides 350 kcal and 13 grams of protein  NUTRITION DIAGNOSIS: Inadequate oral intake related to painful swallow following XRT for SCLC as evidenced by no intake x 5 days.   Goal: Pt to meet >/= 90% of their estimated nutrition needs.  Monitor:  PO intake, supplement acceptance  Reason for Assessment: Malnutrition Screening Tool  79 y.o. male  Admitting Dx: Weakness  ASSESSMENT: Pt recently diagnosed with extensive SCLC. Pt has been undergoing XRT which has caused pt to have very painful swallow which is not any better with cold vs hot or different types of liquids. Pt states that it takes his breath away when he tries to swallow. Per discussion with MD before my visit pt will attempt to increase intake over the weekend but if not successful would like to have PEG placed for nutrition.   Height: Ht Readings from Last 1 Encounters:  10/23/12 5\' 11"  (1.803 m)    Weight: Wt Readings from Last 1 Encounters:  10/24/12 188 lb 4.4 oz (85.4 kg)    Ideal Body Weight: 78.1 kg  % Ideal Body Weight: 109%  Wt Readings from Last 10 Encounters:  10/24/12 188 lb 4.4 oz (85.4 kg)  10/14/12 200 lb 6.4 oz (90.901 kg)  10/13/12 192 lb (87.091 kg)  10/07/12 193 lb 9.6 oz (87.816 kg)  10/06/12 193 lb 6.4 oz (87.726 kg)  09/29/12 204 lb 1.6 oz (92.579 kg)  09/15/12 204 lb 12.8 oz (92.897 kg)  09/10/12 203 lb 1.9 oz  (92.135 kg)  09/09/12 201 lb (91.173 kg)  09/08/12 201 lb 3.2 oz (91.264 kg)    Usual Body Weight: 204 lb   % Usual Body Weight: 92%  BMI:  Body mass index is 26.26 kg/(m^2). Overweight  Estimated Nutritional Needs: Kcal: 2000-2200 Protein: 100-125 grams Fluid: > 2 L/day  Skin: no issues noted  Diet Order: Full Liquid Meal Completion: 0%  EDUCATION NEEDS: -Education needs addressed   Intake/Output Summary (Last 24 hours) at 10/24/12 0942 Last data filed at 10/24/12 0600  Gross per 24 hour  Intake    775 ml  Output    950 ml  Net   -175 ml    Last BM: 1/29   Labs:   Lab 10/24/12 0002 10/23/12 0500 10/22/12 1515  NA 136 137 136  K 3.4* 3.5 4.9  CL 105 106 103  CO2 19 23 20   BUN 27* 24* 23  CREATININE 1.14 1.15 1.12  CALCIUM 8.5 8.4 9.3  MG 1.7 -- --  PHOS -- -- --  GLUCOSE 174* 71 170*    CBG (last 3)   Basename 10/24/12 0548 10/23/12 2245 10/23/12 2148  GLUCAP 140* 102* 59*    Scheduled Meds:   . ceFEPime (MAXIPIME) IV  2 g Intravenous Q8H  . feeding supplement  237 mL Oral TID BM  . insulin aspart  0-9 Units Subcutaneous TID WC  . levothyroxine  75  mcg Oral QAC breakfast  . Linaclotide  145 mcg Oral QODAY  . lisinopril  10 mg Oral q morning - 10a  . pantoprazole (PROTONIX) IV  40 mg Intravenous Q12H  . potassium chloride  20 mEq Oral q morning - 10a  . prochlorperazine  10 mg Oral QHS  . senna  1 tablet Oral BID  . sucralfate  1 g Oral QID  . vancomycin  1,000 mg Intravenous Q12H    Continuous Infusions:   . sodium chloride 75 mL/hr at 10/22/12 2042  . amiodarone (NEXTERONE PREMIX) 360 mg/200 mL dextrose 30 mg/hr (10/24/12 0800)    Past Medical History  Diagnosis Date  . CAD (coronary artery disease)     a. 1990 CABGx3: LIMA->LAD, VG->OM, VG->dRCA;  b. 05/2007 VF Arrest/Cath/PCI: 100 VG->OM tx w/ BMS, 95 VG->RCA tx w/ bms;  c. 06/2007 90 Native PDA tx w DES ;  d. 05/2009 Echo EF 60-65%, nl WM, mild MR; e. normal lexiscan myoview 03/30/12   . HTN (hypertension)   . Hypercholesteremia   . Atrial fibrillation     a. had been on tikosyn -> d/c 09/2010;  b. Was on Amio ->d/c 09/2011;  c.  anticoagulated w/ Pradaxa  . Junctional ectopic tachycardia     a. noted 09/2010  . Sinus bradycardia   . Back pain     a. Followed by [facility] - on prednisone  . Prostate cancer   . Unsteady gait     a. in rehab for core strengthening  . Colitis   . Tubular adenoma of colon 02/2007  . Myocardial infarction   . Arthritis   . CHF (congestive heart failure)   . Depression   . Internal hemorrhoids without mention of complication   . Diverticulosis of colon (without mention of hemorrhage)   . Diabetes mellitus     Type 2 NIDDM x 15 years  . CKD (chronic kidney disease), stage III     pt denies CKD  . GERD (gastroesophageal reflux disease)     Past Surgical History  Procedure Date  . Coronary artery bypass graft 1990  . Prostate surgery 01/1992    retropubic prostatectomy  . Cardiac valve surgery   . Coronary angioplasty with stent placement 2008    x 3   . Rt finger trigger a-1 pulley   . Cholecystectomy 05/05/12  . Cholecystectomy 05/05/2012    Procedure: LAPAROSCOPIC CHOLECYSTECTOMY WITH INTRAOPERATIVE CHOLANGIOGRAM;  Surgeon: Mariella Saa, MD;  Location: [facility] OR;  Service: General;  Laterality: N/A;  . Endobronchial ultrasound 08/04/2012    Procedure: ENDOBRONCHIAL ULTRASOUND;  Surgeon: Kalman Shan, MD;  Location: [facility] OR;  Service: Cardiopulmonary;  Laterality: N/A;  . Video bronchoscopy 08/04/2012    Procedure: VIDEO BRONCHOSCOPY WITH FLUORO;  Surgeon: Kalman Shan, MD;  Location: [facility] OR;  Service: Cardiopulmonary;  Laterality: N/A;    Kendell Bane RD, LDN, CNSC [phone] Weekend/After Hours Pager

## 2012-10-24 NOTE — Progress Notes (Signed)
Pt received 1 unit of PRBC without any reaction. The patient tolerated well. Vitals are charted. Shawn [name]

## 2012-10-25 ENCOUNTER — Encounter: Payer: Self-pay | Admitting: Radiation Oncology

## 2012-10-25 DIAGNOSIS — D61818 Other pancytopenia: Secondary | ICD-10-CM

## 2012-10-25 DIAGNOSIS — N182 Chronic kidney disease, stage 2 (mild): Secondary | ICD-10-CM

## 2012-10-25 DIAGNOSIS — E860 Dehydration: Secondary | ICD-10-CM

## 2012-10-25 LAB — CBC WITH DIFFERENTIAL/PLATELET
Basophils Absolute: 0 10*3/uL (ref 0.0–0.1)
Eosinophils Absolute: 0 10*3/uL (ref 0.0–0.7)
HCT: 20.1 % — ABNORMAL LOW (ref 39.0–52.0)
Lymphocytes Relative: 40 % (ref 12–46)
Lymphs Abs: 0.5 10*3/uL — ABNORMAL LOW (ref 0.7–4.0)
MCHC: 34.8 g/dL (ref 30.0–36.0)
MCV: 84.8 fL (ref 78.0–100.0)
Monocytes Relative: 5 % (ref 3–12)
Neutro Abs: 0.7 10*3/uL — ABNORMAL LOW (ref 1.7–7.7)
Platelets: 7 10*3/uL — CL (ref 150–400)
RDW: 18.1 % — ABNORMAL HIGH (ref 11.5–15.5)
WBC: 1.3 10*3/uL — CL (ref 4.0–10.5)

## 2012-10-25 LAB — TYPE AND SCREEN
Antibody Screen: NEGATIVE
Unit division: 0

## 2012-10-25 LAB — GLUCOSE, CAPILLARY: Glucose-Capillary: 102 mg/dL — ABNORMAL HIGH (ref 70–99)

## 2012-10-25 MED ORDER — VANCOMYCIN HCL 1000 MG IV SOLR
750.0000 mg | Freq: Two times a day (BID) | INTRAVENOUS | Status: DC
  Administered 2012-10-25 – 2012-10-26 (×2): 750 mg via INTRAVENOUS
  Filled 2012-10-25 (×3): qty 750

## 2012-10-25 MED ORDER — GI COCKTAIL ~~LOC~~
30.0000 mL | Freq: Three times a day (TID) | ORAL | Status: DC | PRN
  Filled 2012-10-25: qty 30

## 2012-10-25 MED ORDER — DEXTROSE-NACL 5-0.45 % IV SOLN
INTRAVENOUS | Status: DC
  Administered 2012-10-25 – 2012-10-28 (×3): via INTRAVENOUS

## 2012-10-25 NOTE — Progress Notes (Signed)
[facility] PROGRESS NOTE  Shawn [name] YNW:[id number] DOB: May 03, 1928 DOA: 10/22/2012 PCP: Minda Meo, MD  Assessment/Plan: 1-Neutropenic Fever:continue vanc and cefepime; no further fever episodes; cx's pending but w/o any growth up to date. Will plan on continue vanc and cefepime one more day and if patient continue afebrile and no growth on cx's by 2/3 will transition to levaquin PO and treat for a total of 10 days.  2-pancytopenia: secondary to chemotherapy tx. Will follow cell line trend and transfuse as needed; no signs of overt bleeding. Will continue holding xarelto and avoid heparin products. Oncology on board, will follow rec's. 1 unit of PRBC's given so far, Hgb 7.2 now. Platelets 7,000. Will transfuse 2 units of platelets.  3-HTN: stable, continue current regimen  4-DM: continue SSI  5-hypothyroidism: continue synthroid  6-GERD: continue PPI  7-protein calorie malnutrition (moderate to severe): will follow dietician rec's; continue Ensure TID. Patient has been encouraged to eat; unfortunately unable to tolerate anything by mouth at this point. His degree of malnutrition is escalating and family will like PEG tube placement. Will contact IR; once platelets improved, will arrange placement.  8-esophagitis: continue PPI and carafate; will also add GI coktail.  9-Deconditioning: will ask PT/OT to evaluate patient; patient have 24 hour services for assistance and care and will like to go home at discharge. Will assess to ensure that he did not require any further home health services.  10-atrial fibrillation with SVTs: Converted on 2/1 to sinus rhythm; patient require amiodarone infusion. Also one unit of PRBC was given;  hemoglobin has now remains stable. Will replete electrolytes and continue monitoring on telemetry. If needed will start patient on low-dose beta blocker. Continue holding xarelto.  DVT: SCD's  Code Status: Full Family Communication: son at  bedside Disposition Plan: home when medically stable; unless unsafe; will follow PT/OT rec's to decide on [facility] services.   Consultants:  oncology  Procedures:  See below for x-ray report  Antibiotics:  Cefepime and vanc 1/30  HPI/Subjective: Afebrile, no CP or SOB. No overt bleeding. Complaining of retrosternal discomfort. No appetite. Very weak  Objective: Filed Vitals:   10/25/12 0452 10/25/12 1238 10/25/12 1245 10/25/12 1307  BP: 112/63 120/47 115/71 126/28  Pulse: 81 79 82 75  Temp: 97.5 F (36.4 C) 97.3 F (36.3 C) 97.9 F (36.6 C) 97.8 F (36.6 C)  TempSrc: Oral Oral Oral Oral  Resp:  16 16 16   Height:      Weight: 84.4 kg (186 lb 1.1 oz)     SpO2: 95% 97%      Intake/Output Summary (Last 24 hours) at 10/25/12 1420 Last data filed at 10/25/12 1238  Gross per 24 hour  Intake    797 ml  Output    500 ml  Net    297 ml   Filed Weights   10/23/12 0404 10/24/12 0425 10/25/12 0452  Weight: 84.3 kg (185 lb 13.6 oz) 85.4 kg (188 lb 4.4 oz) 84.4 kg (186 lb 1.1 oz)    Exam:   General:  NAD, afebrile; feeling weak; no shortness of breath; still with a lot of discomfort in his retrosternal area.  Cardiovascular: S1 and S2, no rubs or gallops; rate control and sinus per telemetry  Respiratory: no wheezing, no crackles  Abdomen: soft, NT, ND, positive BS  Neuro: non focal deficit  Data Reviewed: Basic Metabolic Panel:  Lab 10/24/12 6578 10/23/12 0500 10/22/12 1515  NA 136 137 136  K 3.4* 3.5 4.9  CL 105  106 103  CO2 19 23 20   GLUCOSE 174* 71 170*  BUN 27* 24* 23  CREATININE 1.14 1.15 1.12  CALCIUM 8.5 8.4 9.3  MG 1.7 -- --  PHOS -- -- --   Liver Function Tests:  Lab 10/23/12 0500 10/22/12 1515  AST 13 20  ALT 11 15  ALKPHOS 73 98  BILITOT 1.0 1.4*  PROT 5.3* 6.6  ALBUMIN 2.6* 3.4*   CBC:  Lab 10/25/12 0500 10/24/12 0630 10/23/12 0500 10/22/12 1515  WBC 1.3* 0.7* 0.3* 0.4*  NEUTROABS 0.7* -- -- TOO FEW TO COUNT, SMEAR AVAILABLE FOR REVIEW   HGB 7.0* 9.4* 7.2* 9.8*  HCT 20.1* 27.1* 20.8* 29.0*  MCV 84.8 86.6 87.0 87.9  PLT 7* 16* 24* 45*   BNP (last 3 results)  Basename 07/29/12 1207  PROBNP 656.4*   CBG:  Lab 10/25/12 1146 10/25/12 0605 10/24/12 2121 10/24/12 1631 10/24/12 1122  GLUCAP 123* 102* 145* 171* 86    Recent Results (from the past 240 hour(s))  CULTURE, BLOOD (ROUTINE X 2)     Status: Normal (Preliminary result)   Collection Time   10/22/12  5:04 PM      Component Value Range Status Comment   Specimen Description BLOOD HAND LEFT   Final    Special Requests BOTTLES DRAWN AEROBIC ONLY 4CC   Final    Culture  Setup Time 10/22/2012 22:17   Final    Culture     Final    Value:        BLOOD CULTURE RECEIVED NO GROWTH TO DATE CULTURE WILL BE HELD FOR 5 DAYS BEFORE ISSUING A FINAL NEGATIVE REPORT   Report Status PENDING   Incomplete   URINE CULTURE     Status: Normal   Collection Time   10/22/12  5:07 PM      Component Value Range Status Comment   Specimen Description URINE, CLEAN CATCH   Final    Special Requests NONE   Final    Culture  Setup Time 10/22/2012 18:17   Final    Colony Count 6,000 COLONIES/ML   Final    Culture INSIGNIFICANT GROWTH   Final    Report Status 10/23/2012 FINAL   Final   CULTURE, BLOOD (ROUTINE X 2)     Status: Normal (Preliminary result)   Collection Time   10/22/12  5:10 PM      Component Value Range Status Comment   Specimen Description BLOOD HAND RIGHT   Final    Special Requests BOTTLES DRAWN AEROBIC ONLY 2CC   Final    Culture  Setup Time 10/22/2012 22:17   Final    Culture     Final    Value:        BLOOD CULTURE RECEIVED NO GROWTH TO DATE CULTURE WILL BE HELD FOR 5 DAYS BEFORE ISSUING A FINAL NEGATIVE REPORT   Report Status PENDING   Incomplete      Studies: No results found.  Scheduled Meds:    . ceFEPime (MAXIPIME) IV  2 g Intravenous Q8H  . feeding supplement  1 Container Oral TID BM  . insulin aspart  0-9 Units Subcutaneous TID WC  . lactose free nutrition   237 mL Oral TID WC  . levothyroxine  75 mcg Oral QAC breakfast  . Linaclotide  145 mcg Oral QODAY  . lisinopril  10 mg Oral q morning - 10a  . pantoprazole (PROTONIX) IV  40 mg Intravenous Q12H  . potassium chloride  20 mEq  Oral q morning - 10a  . prochlorperazine  10 mg Oral QHS  . senna  1 tablet Oral BID  . sucralfate  1 g Oral QID  . vancomycin  750 mg Intravenous Q12H   Continuous Infusions:    . sodium chloride 20 mL/hr (10/24/12 0927)  . amiodarone (NEXTERONE PREMIX) 360 mg/200 mL dextrose 30 mg/hr (10/25/12 1042)    Time spent: >30 minutes    Shawn [name]  [facility] Pager [phone]. If 8PM-8AM, please contact night-coverage at [email or web address], password [facility] 10/25/2012, 2:20 PM  LOS: 3 days

## 2012-10-25 NOTE — Progress Notes (Signed)
  Radiation Oncology         [phone] ________________________________  Name: Shawn [name] MRN: [id number]  Date: 10/25/2012  DOB: [birth year]  End of Treatment Note  Diagnosis:   Limited stage small cell lung cancer     Indication for treatment:  Definitive treatment along with radiosensitizing chemotherapy       Radiation treatment dates:   08/27/2012 through 10/15/2012  Site/dose:   Left central chest, 59.4 Gy  Beams/energy:   3-D conformal with 5 field set up  Narrative: The patient tolerated radiation treatment relatively well.   He did experience some fatigue. He had minimal esophageal symptoms.  His breathing remained stable.  Plan: The patient has completed radiation treatment. The patient will return to radiation oncology clinic for routine followup in one month. I advised them to call or return sooner if they have any questions or concerns related to their recovery or treatment.  -----------------------------------  Billie Lade, PhD, MD

## 2012-10-25 NOTE — Progress Notes (Signed)
ANTIBIOTIC CONSULT NOTE - INITIAL  Pharmacy Consult for Vancomycin Indication: Severe Neutropenia  No Known Allergies  Patient Measurements: Weight - 90.9kg Height - 178cm  Vital Signs: Temp: 97.5 F (36.4 C) (02/02 0452) Temp src: Oral (02/02 0452) BP: 112/63 mmHg (02/02 0452) Pulse Rate: 81  (02/02 0452)  Labs:  Basename 10/25/12 0500 10/24/12 0630 10/24/12 0002 10/23/12 0500 10/22/12 1515  WBC 1.3* 0.7* -- 0.3* --  HGB 7.0* 9.4* -- 7.2* --  PLT 7* 16* -- 24* --  LABCREA -- -- -- -- --  CREATININE -- -- 1.14 1.15 1.12   Estimated CrCl = 60 ml/min  Microbiology: Recent Results (from the past 720 hour(s))  CULTURE, BLOOD (ROUTINE X 2)     Status: Normal (Preliminary result)   Collection Time   10/22/12  5:04 PM      Component Value Range Status Comment   Specimen Description BLOOD HAND LEFT   Final    Special Requests BOTTLES DRAWN AEROBIC ONLY 4CC   Final    Culture  Setup Time 10/22/2012 22:17   Final    Culture     Final    Value:        BLOOD CULTURE RECEIVED NO GROWTH TO DATE CULTURE WILL BE HELD FOR 5 DAYS BEFORE ISSUING A FINAL NEGATIVE REPORT   Report Status PENDING   Incomplete   URINE CULTURE     Status: Normal   Collection Time   10/22/12  5:07 PM      Component Value Range Status Comment   Specimen Description URINE, CLEAN CATCH   Final    Special Requests NONE   Final    Culture  Setup Time 10/22/2012 18:17   Final    Colony Count 6,000 COLONIES/ML   Final    Culture INSIGNIFICANT GROWTH   Final    Report Status 10/23/2012 FINAL   Final   CULTURE, BLOOD (ROUTINE X 2)     Status: Normal (Preliminary result)   Collection Time   10/22/12  5:10 PM      Component Value Range Status Comment   Specimen Description BLOOD HAND RIGHT   Final    Special Requests BOTTLES DRAWN AEROBIC ONLY 2CC   Final    Culture  Setup Time 10/22/2012 22:17   Final    Culture     Final    Value:        BLOOD CULTURE RECEIVED NO GROWTH TO DATE CULTURE WILL BE HELD FOR 5 DAYS  BEFORE ISSUING A FINAL NEGATIVE REPORT   Report Status PENDING   Incomplete     Medical History: Past Medical History  Diagnosis Date  . CAD (coronary artery disease)     a. 1990 CABGx3: LIMA->LAD, VG->OM, VG->dRCA;  b. 05/2007 VF Arrest/Cath/PCI: 100 VG->OM tx w/ BMS, 95 VG->RCA tx w/ bms;  c. 06/2007 90 Native PDA tx w DES ;  d. 05/2009 Echo EF 60-Shawn%, nl WM, mild MR; e. normal lexiscan myoview 03/30/12  . HTN (hypertension)   . Hypercholesteremia   . Atrial fibrillation     a. had been on tikosyn -> d/c 09/2010;  b. Was on Amio ->d/c 09/2011;  c.  anticoagulated w/ Pradaxa  . Junctional ectopic tachycardia     a. noted 09/2010  . Sinus bradycardia   . Back pain     a. Followed by [facility] - on prednisone  . Prostate cancer   . Unsteady gait     a. in rehab for core strengthening  .  Colitis   . Tubular adenoma of colon 02/2007  . Myocardial infarction   . Arthritis   . CHF (congestive heart failure)   . Depression   . Internal hemorrhoids without mention of complication   . Diverticulosis of colon (without mention of hemorrhage)   . Diabetes mellitus     Type 2 NIDDM x 15 years  . CKD (chronic kidney disease), stage III     pt denies CKD  . GERD (gastroesophageal reflux disease)    Assessment: 79 yo Shawn [name] with SSLC, s/p chemotherapy treatment, now with severe pancytopenia, on cefepime and vancomycin. No fever in the past 48 hrs, wbc trending up slightly 0.7 > 1.3. Vancomycin trough (20.5) is at high end therapeutic range. Scr stable, cultures are negative to date. Per Oncology note, expect to switch to PO abx aftr 3-5 days.   Goal of Therapy:  Vancomycin trough level 15-20 mcg/ml  Plan:   - Change vancomycin to 750 IV Q 12hrs  - Monitor renal function   - Monitor culture data and length of treatment  - Monitor wbc and ANC  Bayard Hugger, PharmD, BCPS  Clinical Pharmacist  Pager: [phone]  Thank you for allowing pharmacy to be part of this patients care team. 10/25/2012,10:44 AM

## 2012-10-25 NOTE — Progress Notes (Signed)
  Radiation Oncology         [phone] ________________________________  Name: Shawn [name] MRN: [id number]  Date: 10/25/2012  DOB: May 30, 1928  Simulation Verification Note,  performed on 08/26/2012  Status: outpatient  NARRATIVE: The patient was brought to the treatment unit and placed in the planned treatment position. The clinical setup was verified. Then port films were obtained and uploaded to the radiation oncology medical record software.  The treatment beams were carefully compared against the planned radiation fields. The position location and shape of the radiation fields was reviewed. They targeted volume of tissue appears to be appropriately covered by the radiation beams. Organs at risk appear to be excluded as planned.  Based on my personal review, I approved the simulation verification. The patient's treatment will proceed as planned.  -----------------------------------  Billie Lade, PhD, MD

## 2012-10-26 ENCOUNTER — Encounter (HOSPITAL_COMMUNITY): Payer: Self-pay | Admitting: Radiology

## 2012-10-26 DIAGNOSIS — F0280 Dementia in other diseases classified elsewhere without behavioral disturbance: Secondary | ICD-10-CM

## 2012-10-26 DIAGNOSIS — E46 Unspecified protein-calorie malnutrition: Secondary | ICD-10-CM

## 2012-10-26 DIAGNOSIS — G309 Alzheimer's disease, unspecified: Secondary | ICD-10-CM

## 2012-10-26 LAB — CBC WITH DIFFERENTIAL/PLATELET
Band Neutrophils: 0 % (ref 0–10)
Basophils Relative: 0 % (ref 0–1)
Blasts: 0 %
Eosinophils Absolute: 0 10*3/uL (ref 0.0–0.7)
Eosinophils Relative: 0 % (ref 0–5)
HCT: 23.5 % — ABNORMAL LOW (ref 39.0–52.0)
Hemoglobin: 8.1 g/dL — ABNORMAL LOW (ref 13.0–17.0)
Lymphocytes Relative: 18 % (ref 12–46)
Lymphs Abs: 0.9 10*3/uL (ref 0.7–4.0)
MCV: 85.8 fL (ref 78.0–100.0)
Metamyelocytes Relative: 0 %
Monocytes Absolute: 0.5 10*3/uL (ref 0.1–1.0)
Monocytes Relative: 9 % (ref 3–12)
RBC: 2.74 MIL/uL — ABNORMAL LOW (ref 4.22–5.81)
RDW: 18 % — ABNORMAL HIGH (ref 11.5–15.5)
WBC: 5.2 10*3/uL (ref 4.0–10.5)

## 2012-10-26 LAB — PREPARE PLATELET PHERESIS
Unit division: 0
Unit division: 0

## 2012-10-26 LAB — GLUCOSE, CAPILLARY
Glucose-Capillary: 110 mg/dL — ABNORMAL HIGH (ref 70–99)
Glucose-Capillary: 106 mg/dL — ABNORMAL HIGH (ref 70–99)
Glucose-Capillary: 110 mg/dL — ABNORMAL HIGH (ref 70–99)

## 2012-10-26 LAB — PROTIME-INR: INR: 1.18 (ref 0.00–1.49)

## 2012-10-26 MED ORDER — LEVOFLOXACIN 500 MG PO TABS
500.0000 mg | ORAL_TABLET | Freq: Every day | ORAL | Status: DC
  Administered 2012-10-26 – 2012-10-28 (×3): 500 mg via ORAL
  Filled 2012-10-26 (×3): qty 1

## 2012-10-26 MED ORDER — METOPROLOL TARTRATE 12.5 MG HALF TABLET
12.5000 mg | ORAL_TABLET | Freq: Two times a day (BID) | ORAL | Status: DC
  Administered 2012-10-26 – 2012-10-28 (×5): 12.5 mg via ORAL
  Filled 2012-10-26 (×6): qty 1

## 2012-10-26 MED ORDER — CEFAZOLIN SODIUM-DEXTROSE 2-3 GM-% IV SOLR
2.0000 g | Freq: Once | INTRAVENOUS | Status: DC
  Filled 2012-10-26: qty 50

## 2012-10-26 NOTE — H&P (Signed)
Agree with PA note.  Esophagitis but no obstructing mass, should tolerate placement of a pull through G-tube.  Signed,  Sterling Big, MD Vascular & Interventional Radiologist [facility] Radiology

## 2012-10-26 NOTE — Progress Notes (Signed)
Occupational Therapy Evaluation Patient Details Name: Shawn [name] MRN: [id number] DOB: [birth year] Today's Date: 10/26/2012 Time: 8295-6213 OT Time Calculation (min): 35 min  OT Assessment / Plan / Recommendation Clinical Impression  79 yo with hx of lung CA with ongoing chemo. Pt undergoing Peg placement tomorrow. Pt with general deconditioning. Per grand daughter, pt was independent with ADL, mobility and driving 1 week prior to admission. Pt will benefit from skilled OT serivces to facilitate safe D/C home with family. Pt will benefit from [facility] at D/C. Discussed equipment needs with pt/family, including need to set pt up on main  level to eliminate need to go up/down stairs and reduce risk of falls.    OT Assessment  Patient needs continued OT Services    Follow Up Recommendations  Home health OT    Barriers to Discharge None    Equipment Recommendations  3 in 1 bedside comode;Hospital bed    Recommendations for Other Services  none  Frequency  Min 2X/week    Precautions / Restrictions Precautions Precautions: Fall Restrictions Weight Bearing Restrictions: No   Pertinent Vitals/Pain No c/o pain    ADL  Eating/Feeding: Other (comment) (difficulty with swallow ?radiation. PEG scheduled tomorrow) Grooming: Set up Where Assessed - Grooming: Unsupported sitting Upper Body Bathing: Minimal assistance Where Assessed - Upper Body Bathing: Unsupported sitting Lower Body Bathing: Moderate assistance Where Assessed - Lower Body Bathing: Supported sit to stand Upper Body Dressing: Minimal assistance Where Assessed - Upper Body Dressing: Unsupported sitting Lower Body Dressing: Maximal assistance Where Assessed - Lower Body Dressing: Supported sit to stand Toilet Transfer: Minimal assistance Toilet Transfer Method: Surveyor, minerals: Materials engineer and Hygiene: Min guard Where Assessed - Engineer, mining and  Hygiene: Standing Equipment Used: Gait belt Transfers/Ambulation Related to ADLs: mina ADL Comments: Limited promarily by fatigue    OT Diagnosis: Generalized weakness;Acute pain  OT Problem List: Decreased strength;Decreased activity tolerance;Decreased knowledge of use of DME or AE;Cardiopulmonary status limiting activity;Pain OT Treatment Interventions: Self-care/ADL training;Therapeutic exercise;Energy conservation;DME and/or AE instruction;Therapeutic activities;Patient/family education   OT Goals Acute Rehab OT Goals OT Goal Formulation: With patient Time For Goal Achievement: 11/09/12 Potential to Achieve Goals: Good ADL Goals Pt Will Perform Upper Body Bathing: with supervision;Sitting, edge of bed;Unsupported ADL Goal: Upper Body Bathing - Progress: Goal set today Pt Will Transfer to Toilet: with min assist;with caregiver independent in assisting;3-in-1 ADL Goal: Toilet Transfer - Progress: Goal set today Pt Will Perform Toileting - Clothing Manipulation: with supervision;Standing;with caregiver independent in assisting ADL Goal: Toileting - Clothing Manipulation - Progress: Goal set today Pt Will Perform Toileting - Hygiene: with supervision;Sit to stand from 3-in-1/toilet;with caregiver independent in assisting ADL Goal: Toileting - Hygiene - Progress: Goal set today ADL Goal: Additional Goal #1 - Progress: Goal set today Additional ADL Goal #2: family will verbalize understnding of E conservation to assist pt with ADL  Visit Information  Last OT Received On: 10/26/12 Assistance Needed: +1    Subjective Data      Prior Functioning     Home Living Lives With: Son Available Help at Discharge: Family;Available 24 hours/day Type of Home: House Home Access: Stairs to enter [employer] of Steps: 3 Entrance Stairs-Rails: Right Home Layout: Two level;1/2 bath on main level;Bed/bath upstairs Alternate Level Stairs-Number of Steps: flight Alternate Level  Stairs-Rails: Right Bathroom Shower/Tub: Health visitor: Standard Home Adaptive Equipment: Walker - rolling;Wheelchair - manual;Bedside commode/3-in-1 Prior Function Level of Independence: Independent Able  to Take Stairs?: Yes Driving: Yes Vocation: Retired Musician: No difficulties;HOH Dominant Hand: Right         Vision/Perception     Copywriter, advertising Overall Cognitive Status: Impaired Area of Impairment: Memory;Problem solving Arousal/Alertness: Awake/alert Orientation Level: Appears intact for tasks assessed Behavior During Session: [facility] for tasks performed Memory: Decreased recall of precautions Memory Deficits: new onset of memory deficits per family Problem Solving: Mina functional basic Cognition - Other Comments: slow processing - requires increased time for problem solving    Extremity/Trunk Assessment Right Upper Extremity Assessment RUE ROM/Strength/Tone: Deficits RUE ROM/Strength/Tone Deficits: general weakness 4/5 throughout RUE Sensation: WFL - Light Touch;WFL - Proprioception RUE Coordination: WFL - gross/fine motor Left Upper Extremity Assessment LUE ROM/Strength/Tone: Deficits LUE ROM/Strength/Tone Deficits: general weakness 4/5 throughout LUE Sensation: WFL - Light Touch;WFL - Proprioception LUE Coordination: WFL - gross/fine motor Right Lower Extremity Assessment RLE ROM/Strength/Tone: WFL for tasks assessed Left Lower Extremity Assessment LLE ROM/Strength/Tone: WFL for tasks assessed Trunk Assessment Trunk Assessment: Normal     Mobility Bed Mobility Bed Mobility: Supine to Sit;Sitting - Scoot to Edge of Bed;Sit to Supine Supine to Sit: 4: Min guard;With rails;HOB elevated Sitting - Scoot to Edge of Bed: 5: Supervision Sit to Supine: 5: Supervision;With rail;HOB elevated Details for Bed Mobility Assistance: hospital bed increases independence Transfers Transfers: Sit to Stand;Stand to Sit Sit to  Stand: 4: Min assist;With upper extremity assist;From bed Stand to Sit: 4: Min assist;With upper extremity assist;To bed Details for Transfer Assistance: vc for safety     Exercise General Exercises - Upper Extremity Shoulder Flexion: Strengthening;Both;10 reps;Theraband Theraband Level (Shoulder Flexion): Level 1 (Yellow) Shoulder Extension: Strengthening;Both;10 reps;Theraband Theraband Level (Shoulder Extension): Level 1 (Yellow) Elbow Flexion: Strengthening;Both;10 reps;Theraband Theraband Level (Elbow Flexion): Level 1 (Yellow) Elbow Extension: Strengthening;Both;10 reps;Theraband Theraband Level (Elbow Extension): Level 1 (Yellow)   Balance Balance Balance Assessed: Yes Static Sitting Balance Static Sitting - Balance Support: No upper extremity supported;Feet supported Static Sitting - Level of Assistance: 5: Stand by assistance Static Sitting - Comment/# of Minutes: 10 Static Standing Balance Static Standing - Balance Support: Bilateral upper extremity supported Static Standing - Level of Assistance: 4: Min assist Static Standing - Comment/# of Minutes: 5   End of Session OT - End of Session Equipment Utilized During Treatment: Gait belt Activity Tolerance: Patient limited by fatigue Patient left: in bed;with call bell/phone within reach;with family/visitor present Nurse Communication: Mobility status  GO     Jeidy Hoerner,HILLARY 10/26/2012, 6:42 PMHilary Jamecia Lerman, OTR/L  [phone] 10/26/2012

## 2012-10-26 NOTE — Progress Notes (Signed)
[facility] PROGRESS NOTE  Shawn [name] ZOX:[id number] DOB: [birth year] DOA: 10/22/2012 PCP: Minda Meo, MD  Assessment/Plan: 1-Neutropenic Fever:continue vanc and cefepime; no further fever episodes; cx's pending but w/o any growth up to date. Will transition to levaquin PO and treat for a total of 10 days (7 more doses starting today).  2-pancytopenia: secondary to chemotherapy tx. Will follow cell line trend and transfuse as needed; no signs of overt bleeding. Will continue holding xarelto and avoid heparin products. Oncology on board, will follow rec's. 1 unit of PRBC's given so far, Hgb 8.1 now (2/3). Platelets 62,000 today (2/3) after transfusing 2 units of platelets. Will monitor.  3-HTN: stable, continue current regimen  4-DM: continue SSI  5-hypothyroidism: continue synthroid  6-GERD: continue PPI and carafte  7-protein calorie malnutrition (moderate to severe): will follow dietician rec's; continue Ensure TID. Patient has been encouraged to eat; unfortunately unable to tolerate anything by mouth at this point. His degree of malnutrition is escalating and family will like PEG tube placement. IR consulted and if platelets level >70,000 on 2/4, plan is to place peg tube. This will also help with medication administration.  8-esophagitis: continue PPI and carafate and also add GI coktail.  9-Deconditioning: will ask PT/OT to evaluate patient; patient have 24 hour services for assistance and care and will like to go home at discharge. Will assess to ensure that he did not require any further home health services.  10-atrial fibrillation with SVTs: Converted on 2/1 to sinus rhythm; patient require amiodarone infusion. Also one unit of PRBC was given;  HR has now remains stable. Will use low dose b/b to continue controlling his rate. xarelto to be resumed once platelets level remains stable.  DVT: SCD's  Code Status: Full Family Communication: son at bedside Disposition  Plan: home when medically stable; unless unsafe; will follow PT/OT rec's to decide on [facility] services.   Consultants:  Oncology  IR  Procedures:  See below for x-ray report  Antibiotics:  Cefepime and vanc 1/30-2/3  Levaquin 2/3 (for 7 doses to complete tx)  HPI/Subjective: Afebrile, no CP or SOB. No overt bleeding. Still complaining of retrosternal discomfort with any PO intake, liquid, solids or even medications. Continue to be very weak  Objective: Filed Vitals:   10/25/12 1500 10/25/12 1600 10/25/12 1611 10/26/12 0513  BP: 113/49 111/47 114/48 112/55  Pulse: 74 70 76 80  Temp: 98.3 F (36.8 C) 98 F (36.7 C) 98 F (36.7 C) 98 F (36.7 C)  TempSrc: Axillary Oral Oral Oral  Resp: 16 16 14 12   Height:      Weight:    86.6 kg (190 lb 14.7 oz)  SpO2: 95%   98%    Intake/Output Summary (Last 24 hours) at 10/26/12 1255 Last data filed at 10/26/12 0612  Gross per 24 hour  Intake    277 ml  Output    400 ml  Net   -123 ml   Filed Weights   10/24/12 0425 10/25/12 0452 10/26/12 0513  Weight: 85.4 kg (188 lb 4.4 oz) 84.4 kg (186 lb 1.1 oz) 86.6 kg (190 lb 14.7 oz)    Exam:   General:  NAD, afebrile; feeling weak; no shortness of breath; still with a lot of discomfort in his retrosternal area.  Cardiovascular: S1 and S2, no rubs or gallops; rate control and sinus per telemetry  Respiratory: no wheezing, no crackles  Abdomen: soft, NT, ND, positive BS  Neuro: non focal deficit  Data Reviewed: Basic  Metabolic Panel:  Lab 10/24/12 0981 10/23/12 0500 10/22/12 1515  NA 136 137 136  K 3.4* 3.5 4.9  CL 105 106 103  CO2 19 23 20   GLUCOSE 174* 71 170*  BUN 27* 24* 23  CREATININE 1.14 1.15 1.12  CALCIUM 8.5 8.4 9.3  MG 1.7 -- --  PHOS -- -- --   Liver Function Tests:  Lab 10/23/12 0500 10/22/12 1515  AST 13 20  ALT 11 15  ALKPHOS 73 98  BILITOT 1.0 1.4*  PROT 5.3* 6.6  ALBUMIN 2.6* 3.4*   CBC:  Lab 10/26/12 0500 10/25/12 0500 10/24/12 0630 10/23/12  0500 10/22/12 1515  WBC 5.2 1.3* 0.7* 0.3* 0.4*  NEUTROABS 3.8 0.7* -- -- TOO FEW TO COUNT, SMEAR AVAILABLE FOR REVIEW  HGB 8.1* 7.0* 9.4* 7.2* 9.8*  HCT 23.5* 20.1* 27.1* 20.8* 29.0*  MCV 85.8 84.8 86.6 87.0 87.9  PLT 62* 7* 16* 24* 45*   BNP (last 3 results)  Basename 07/29/12 1207  PROBNP 656.4*   CBG:  Lab 10/26/12 1119 10/26/12 0614 10/25/12 2143 10/25/12 1624 10/25/12 1146  GLUCAP 110* 110* 107* 114* 123*    Recent Results (from the past 240 hour(s))  CULTURE, BLOOD (ROUTINE X 2)     Status: Normal (Preliminary result)   Collection Time   10/22/12  5:04 PM      Component Value Range Status Comment   Specimen Description BLOOD HAND LEFT   Final    Special Requests BOTTLES DRAWN AEROBIC ONLY 4CC   Final    Culture  Setup Time 10/22/2012 22:17   Final    Culture     Final    Value:        BLOOD CULTURE RECEIVED NO GROWTH TO DATE CULTURE WILL BE HELD FOR 5 DAYS BEFORE ISSUING A FINAL NEGATIVE REPORT   Report Status PENDING   Incomplete   URINE CULTURE     Status: Normal   Collection Time   10/22/12  5:07 PM      Component Value Range Status Comment   Specimen Description URINE, CLEAN CATCH   Final    Special Requests NONE   Final    Culture  Setup Time 10/22/2012 18:17   Final    Colony Count 6,000 COLONIES/ML   Final    Culture INSIGNIFICANT GROWTH   Final    Report Status 10/23/2012 FINAL   Final   CULTURE, BLOOD (ROUTINE X 2)     Status: Normal (Preliminary result)   Collection Time   10/22/12  5:10 PM      Component Value Range Status Comment   Specimen Description BLOOD HAND RIGHT   Final    Special Requests BOTTLES DRAWN AEROBIC ONLY 2CC   Final    Culture  Setup Time 10/22/2012 22:17   Final    Culture     Final    Value:        BLOOD CULTURE RECEIVED NO GROWTH TO DATE CULTURE WILL BE HELD FOR 5 DAYS BEFORE ISSUING A FINAL NEGATIVE REPORT   Report Status PENDING   Incomplete      Scheduled Meds:    .  ceFAZolin (ANCEF) IV  2 g Intravenous Once  . feeding  supplement  1 Container Oral TID BM  . insulin aspart  0-9 Units Subcutaneous TID WC  . lactose free nutrition  237 mL Oral TID WC  . levofloxacin  500 mg Oral Daily  . levothyroxine  75 mcg Oral QAC breakfast  .  Linaclotide  145 mcg Oral QODAY  . lisinopril  10 mg Oral q morning - 10a  . metoprolol tartrate  12.5 mg Oral BID  . pantoprazole (PROTONIX) IV  40 mg Intravenous Q12H  . potassium chloride  20 mEq Oral q morning - 10a  . prochlorperazine  10 mg Oral QHS  . senna  1 tablet Oral BID  . sucralfate  1 g Oral QID   Continuous Infusions:    . dextrose 5 % and 0.45% NaCl 50 mL/hr at 10/26/12 0800    Time spent: >30 minutes    Shawn [name]  [facility] Pager [phone]. If 8PM-8AM, please contact night-coverage at [email or web address], password [facility] 10/26/2012, 12:55 PM  LOS: 4 days

## 2012-10-26 NOTE — H&P (Signed)
Shawn [name] is an 79 y.o. male.   Chief Complaint: Lung Ca; prostate ca hx Pancytopenia; ongoing ca tx; chemotherapy Protein calorie malnutrition; esophagitis; deconditioning Scheduled for Percutaneous Gastric Tube placement in IR 2/4 HPI: CAD/CABG; HTN; HLD; A fib; CHF; DM; GERD plts 62 today  Past Medical History  Diagnosis Date  . CAD (coronary artery disease)     a. 1990 CABGx3: LIMA->LAD, VG->OM, VG->dRCA;  b. 05/2007 VF Arrest/Cath/PCI: 100 VG->OM tx w/ BMS, 95 VG->RCA tx w/ bms;  c. 06/2007 90 Native PDA tx w DES ;  d. 05/2009 Echo EF 60-65%, nl WM, mild MR; e. normal lexiscan myoview 03/30/12  . HTN (hypertension)   . Hypercholesteremia   . Atrial fibrillation     a. had been on tikosyn -> d/c 09/2010;  b. Was on Amio ->d/c 09/2011;  c.  anticoagulated w/ Pradaxa  . Junctional ectopic tachycardia     a. noted 09/2010  . Sinus bradycardia   . Back pain     a. Followed by [facility] - on prednisone  . Prostate cancer   . Unsteady gait     a. in rehab for core strengthening  . Colitis   . Tubular adenoma of colon 02/2007  . Myocardial infarction   . Arthritis   . CHF (congestive heart failure)   . Depression   . Internal hemorrhoids without mention of complication   . Diverticulosis of colon (without mention of hemorrhage)   . Diabetes mellitus     Type 2 NIDDM x 15 years  . CKD (chronic kidney disease), stage III     pt denies CKD  . GERD (gastroesophageal reflux disease)     Past Surgical History  Procedure Date  . Coronary artery bypass graft 1990  . Prostate surgery 01/1992    retropubic prostatectomy  . Cardiac valve surgery   . Coronary angioplasty with stent placement 2008    x 3   . Rt finger trigger a-1 pulley   . Cholecystectomy 05/05/12  . Cholecystectomy 05/05/2012    Procedure: LAPAROSCOPIC CHOLECYSTECTOMY WITH INTRAOPERATIVE CHOLANGIOGRAM;  Surgeon: Mariella Saa, MD;  Location: [facility] OR;  Service: General;  Laterality: N/A;  . Endobronchial ultrasound  08/04/2012    Procedure: ENDOBRONCHIAL ULTRASOUND;  Surgeon: Kalman Shan, MD;  Location: [facility] OR;  Service: Cardiopulmonary;  Laterality: N/A;  . Video bronchoscopy 08/04/2012    Procedure: VIDEO BRONCHOSCOPY WITH FLUORO;  Surgeon: Kalman Shan, MD;  Location: [facility] OR;  Service: Cardiopulmonary;  Laterality: N/A;    Family History  Problem Relation Age of Onset  . Other Father     died of unknown cause @ young age  . Coronary artery disease Mother     died in late 51's w/ "hardening of the arteries"  . Heart disease Mother   . Heart disease Son   . Diabetes Son   . Colon cancer Neg Hx    Social History:  reports that he quit smoking about 24 years ago. His smoking use included Cigarettes. He quit after 40 years of use. He has never used smokeless tobacco. He reports that he does not drink alcohol or use illicit drugs.  Allergies: No Known Allergies  Medications Prior to Admission  Medication Sig Dispense Refill  . aspirin 81 MG tablet Take 1 tablet (81 mg total) by mouth every other day.  30 tablet  1  . furosemide (LASIX) 20 MG tablet Take 20 mg by mouth every morning.      Marland Kitchen glipiZIDE (GLUCOTROL  XL) 10 MG 24 hr tablet Take 10 mg by mouth every morning.      Marland Kitchen HYDROcodone-acetaminophen (NORCO) 7.5-325 MG per tablet Take 1 tablet by mouth every 6 (six) hours as needed. For pain      . lansoprazole (PREVACID) 15 MG capsule Take 15 mg by mouth at bedtime.      Marland Kitchen levothyroxine (SYNTHROID, LEVOTHROID) 75 MCG tablet Take 75 mcg by mouth every morning.       . lidocaine-prilocaine (EMLA) cream Apply topically as needed. Apply to port 1 hour before chemotherapy, cover with plastic dressing.  30 g  0  . Linaclotide (LINZESS) 145 MCG CAPS Take 145 mcg by mouth every other day.      . lisinopril (PRINIVIL,ZESTRIL) 10 MG tablet Take 10 mg by mouth every morning.      . Multiple Vitamin (MULTIVITAMIN) tablet Take 1 tablet by mouth every morning.       . nitroGLYCERIN (NITROSTAT) 0.4 MG SL  tablet Place 0.4 mg under the tongue every 5 (five) minutes as needed. For chest pain      . potassium chloride (K-DUR,KLOR-CON) 10 MEQ tablet Take 10 mEq by mouth every morning.       . prochlorperazine (COMPAZINE) 10 MG tablet Take 10 mg by mouth at bedtime.      . Rivaroxaban (XARELTO) 15 MG TABS tablet Take 15 mg by mouth daily with supper.      . rosuvastatin (CRESTOR) 10 MG tablet Take 10 mg by mouth every morning.      . sucralfate (CARAFATE) 1 GM/10ML suspension Take 1 g by mouth 4 (four) times daily. Refill called into [facility] [address] To [facility] [phone] directions unchanged with one refill        Results for orders placed during the hospital encounter of 10/22/12 (from the past 48 hour(s))  GLUCOSE, CAPILLARY     Status: Abnormal   Collection Time   10/24/12  4:31 PM      Component Value Range Comment   Glucose-Capillary 171 (*) 70 - 99 mg/dL    Comment 1 Notify RN     GLUCOSE, CAPILLARY     Status: Abnormal   Collection Time   10/24/12  9:21 PM      Component Value Range Comment   Glucose-Capillary 145 (*) 70 - 99 mg/dL   CBC WITH DIFFERENTIAL     Status: Abnormal   Collection Time   10/25/12  5:00 AM      Component Value Range Comment   WBC 1.3 (*) 4.0 - 10.5 K/uL    RBC 2.37 (*) 4.22 - 5.81 MIL/uL    Hemoglobin 7.0 (*) 13.0 - 17.0 g/dL    HCT 09.8 (*) 11.9 - 52.0 %    MCV 84.8  78.0 - 100.0 fL    MCH 29.5  26.0 - 34.0 pg    MCHC 34.8  30.0 - 36.0 g/dL    RDW 14.7 (*) 82.9 - 15.5 %    Platelets 7 (*) 150 - 400 K/uL    Neutrophils Relative 51  43 - 77 %    Lymphocytes Relative 40  12 - 46 %    Monocytes Relative 5  3 - 12 %    Eosinophils Relative 2  0 - 5 %    Basophils Relative 2 (*) 0 - 1 %    Neutro Abs 0.7 (*) 1.7 - 7.7 K/uL    Lymphs Abs 0.5 (*) 0.7 - 4.0 K/uL  Monocytes Absolute 0.1  0.1 - 1.0 K/uL    Eosinophils Absolute 0.0  0.0 - 0.7 K/uL    Basophils Absolute 0.0  0.0 - 0.1 K/uL    WBC Morphology MILD LEFT SHIFT (1-5% METAS, OCC MYELO, OCC  BANDS)   TOXIC GRANULATION  GLUCOSE, CAPILLARY     Status: Abnormal   Collection Time   10/25/12  6:05 AM      Component Value Range Comment   Glucose-Capillary 102 (*) 70 - 99 mg/dL   PREPARE PLATELET PHERESIS     Status: Normal   Collection Time   10/25/12  8:00 AM      Component Value Range Comment   Unit Number [id number]      Blood Component Type PLTPHER LR2      Unit division 00      Status of Unit ISSUED,FINAL      Transfusion Status OK TO TRANSFUSE      Unit Number [id number]      Blood Component Type PLTPHER LR1      Unit division 00      Status of Unit ISSUED,FINAL      Transfusion Status OK TO TRANSFUSE     VANCOMYCIN, TROUGH     Status: Abnormal   Collection Time   10/25/12  9:30 AM      Component Value Range Comment   Vancomycin Tr 20.5 (*) 10.0 - 20.0 ug/mL   GLUCOSE, CAPILLARY     Status: Abnormal   Collection Time   10/25/12 11:46 AM      Component Value Range Comment   Glucose-Capillary 123 (*) 70 - 99 mg/dL    Comment 1 Notify RN     GLUCOSE, CAPILLARY     Status: Abnormal   Collection Time   10/25/12  4:24 PM      Component Value Range Comment   Glucose-Capillary 114 (*) 70 - 99 mg/dL    Comment 1 Notify RN     GLUCOSE, CAPILLARY     Status: Abnormal   Collection Time   10/25/12  9:43 PM      Component Value Range Comment   Glucose-Capillary 107 (*) 70 - 99 mg/dL   CBC WITH DIFFERENTIAL     Status: Abnormal   Collection Time   10/26/12  5:00 AM      Component Value Range Comment   WBC 5.2  4.0 - 10.5 K/uL    RBC 2.74 (*) 4.22 - 5.81 MIL/uL    Hemoglobin 8.1 (*) 13.0 - 17.0 g/dL    HCT 30.8 (*) 65.7 - 52.0 %    MCV 85.8  78.0 - 100.0 fL    MCH 29.6  26.0 - 34.0 pg    MCHC 34.5  30.0 - 36.0 g/dL    RDW 84.6 (*) 96.2 - 15.5 %    Platelets 62 (*) 150 - 400 K/uL    Neutrophils Relative 73  43 - 77 %    Lymphocytes Relative 18  12 - 46 %    Monocytes Relative 9  3 - 12 %    Eosinophils Relative 0  0 - 5 %    Basophils Relative 0  0 - 1 %    Band  Neutrophils 0  0 - 10 %    Metamyelocytes Relative 0      Myelocytes 0      Promyelocytes Absolute 0      Blasts 0      nRBC 0  0 /100 WBC  Neutro Abs 3.8  1.7 - 7.7 K/uL    Lymphs Abs 0.9  0.7 - 4.0 K/uL    Monocytes Absolute 0.5  0.1 - 1.0 K/uL    Eosinophils Absolute 0.0  0.0 - 0.7 K/uL    Basophils Absolute 0.0  0.0 - 0.1 K/uL    RBC Morphology ELLIPTOCYTES      WBC Morphology INCREASED BANDS (>20% BANDS)     GLUCOSE, CAPILLARY     Status: Abnormal   Collection Time   10/26/12  6:14 AM      Component Value Range Comment   Glucose-Capillary 110 (*) 70 - 99 mg/dL   GLUCOSE, CAPILLARY     Status: Abnormal   Collection Time   10/26/12 11:19 AM      Component Value Range Comment   Glucose-Capillary 110 (*) 70 - 99 mg/dL    No results found.  Review of Systems  Constitutional: Positive for weight loss. Negative for fever.  Respiratory: Negative for cough.   Cardiovascular: Negative for chest pain.  Gastrointestinal: Negative for nausea and vomiting.  Neurological: Positive for weakness.    Blood pressure 112/55, pulse 80, temperature 98 F (36.7 C), temperature source Oral, resp. rate 12, height 5\' 11"  (1.803 m), weight 190 lb 14.7 oz (86.6 kg), SpO2 98.00%. Physical Exam  Constitutional: He is oriented to person, place, and time.  Cardiovascular: Normal rate, regular rhythm and normal heart sounds.   Respiratory: Effort normal and breath sounds normal. He has no wheezes.  GI: Soft. Bowel sounds are normal. There is no tenderness.  Musculoskeletal: Normal range of motion.  Neurological: He is alert and oriented to person, place, and time.  Psychiatric: He has a normal mood and affect. His behavior is normal. Judgment and thought content normal.     Assessment/Plan Malnutrition; deconditioning Pancytopenia; plt 62 today Scheduled for perc G tube in am Will check plt in am Off Xeralto since 1/30 (afib) Barium ordered antibx ordered Check kub in am   Kriston Mckinnie  A 10/26/2012, 11:30 AM

## 2012-10-26 NOTE — Progress Notes (Signed)
Physical Therapy Treatment Patient Details Name: Shawn [name] MRN: [id number] DOB: 1928-02-22 Today's Date: 10/26/2012 Time: 2952-8413 PT Time Calculation (min): 14 min  PT Assessment / Plan / Recommendation Comments on Treatment Session  79 yo adm s/p lung CA with dehydration and runs of VTach. Weakened by chemo and radiation with limited po intake due to pain with swallowing. Pt with limited participation due to pain s/p swallowing meds crushed in applesauce despite education re: importance of increasing activity to prepare him for ultimate d/c home. Pt open to idea of renting a hospital bed to place in his downstairs so he does not have to climb a flight of steps to his bedroom. Noted plans for PEG 2/4. Will continue to follow.    Follow Up Recommendations  Home health PT;Supervision/Assistance - 24 hour         Barriers to Discharge        Equipment Recommendations  Hospital bed;Other (comment) (3n1)        Frequency Min 3X/week   Plan Discharge plan remains appropriate;Frequency remains appropriate;Equipment recommendations need to be updated    Precautions / Restrictions Precautions Precautions: Fall   Pertinent Vitals/Pain Pain in Rt flank/chest after swallowing medicines in applesauce. RN present and aware; repositioned for comfort.    Mobility  Bed Mobility Bed Mobility: Supine to Sit;Sitting - Scoot to Edge of Bed Supine to Sit: 4: Min assist;HOB elevated;With rails (HOB 20) Sitting - Scoot to Edge of Bed: 4: Min guard Details for Bed Mobility Assistance: Pt ultimately agreed to attempt OOB. Continued to report Rt sided chest/flank pain due to swallowing.  Transfers Transfers: Sit to Stand;Stand to [employer] Transfers Sit to Stand: 4: Min assist;With upper extremity assist;From bed Stand to Sit: 4: Min assist;With upper extremity assist;With armrests;To chair/3-in-1 Stand Pivot Transfers: 4: Min assist Details for Transfer Assistance: pt declined to use RW "I'm  just going right there, I don't need it." Pt, however, reaching to hold onto PT's hand/arm and arm of chair Ambulation/Gait Ambulation/Gait Assistance: Not tested (comment)    PT Goals Acute Rehab PT Goals Pt will go Supine/Side to Sit: with supervision PT Goal: Supine/Side to Sit - Progress: Not progressing Pt will go Sit to Stand: Independently PT Goal: Sit to Stand - Progress: Not progressing Pt will Transfer Bed to Chair/Chair to Bed: Independently PT Transfer Goal: Bed to Chair/Chair to Bed - Progress: Not progressing  Visit Information  Last PT Received On: 10/26/12 Assistance Needed: +2 (for chair to follow)    Subjective Data  Subjective: Pt initially agreeable to walk...after trying to swallow meds in applesauce, "I can't, it just hurts too much" as he held his Rt side Patient Stated Goal: To go home with family   Cognition  Cognition Overall Cognitive Status: Appears within functional limits for tasks assessed/performed Arousal/Alertness: Awake/alert Orientation Level: Appears intact for tasks assessed Behavior During Session: Anxious        End of Session PT - End of Session Activity Tolerance: Patient limited by pain Patient left: in chair;with call bell/phone within reach;with family/visitor present Nurse Communication: Other (comment) (RN in room and aware of reports of pain s/p swallowing)   GP     Shawn [name] 10/26/2012, 12:56 PM Pager [phone]

## 2012-10-27 ENCOUNTER — Inpatient Hospital Stay (HOSPITAL_COMMUNITY): Payer: Medicare Other

## 2012-10-27 DIAGNOSIS — R5383 Other fatigue: Secondary | ICD-10-CM | POA: Insufficient documentation

## 2012-10-27 DIAGNOSIS — F329 Major depressive disorder, single episode, unspecified: Secondary | ICD-10-CM | POA: Insufficient documentation

## 2012-10-27 LAB — CBC WITH DIFFERENTIAL/PLATELET
Eosinophils Absolute: 0 10*3/uL (ref 0.0–0.7)
Eosinophils Relative: 0 % (ref 0–5)
Lymphs Abs: 0.7 10*3/uL (ref 0.7–4.0)
MCH: 30.3 pg (ref 26.0–34.0)
MCHC: 35.7 g/dL (ref 30.0–36.0)
MCV: 84.9 fL (ref 78.0–100.0)
Monocytes Absolute: 1.2 10*3/uL — ABNORMAL HIGH (ref 0.1–1.0)
Neutrophils Relative %: 79 % — ABNORMAL HIGH (ref 43–77)
Platelets: 38 10*3/uL — ABNORMAL LOW (ref 150–400)
RDW: 18 % — ABNORMAL HIGH (ref 11.5–15.5)

## 2012-10-27 LAB — GLUCOSE, CAPILLARY: Glucose-Capillary: 118 mg/dL — ABNORMAL HIGH (ref 70–99)

## 2012-10-27 NOTE — Progress Notes (Signed)
Pt ate approximately 75 % of his noon meal. Pt stated that the warm foods feels good to his throat. Will continue to monitor and encourage.

## 2012-10-27 NOTE — Progress Notes (Signed)
CBC reveals this morning platelets below level necessary to safely perform gastric tube placement. (prefer closer to 80,000 range if possible) Barium remains within the small bowel as well - needs more time to pass into the colon. Recommend platelet transfusion prior to g-tube - if barium further advanced tomorrow and platelets within safe range - will proceed with gastric tube placement - transfusion of platelets deferred to primary team. Will follow CBC results as well as recheck KUB in am to re-evaluate for possible placement of gastric tube 2/5.

## 2012-10-27 NOTE — Assessment & Plan Note (Signed)
His moderate fatigue due to cancer and depression. I've advised him treadmill exercises. He's not interested in pulmonary rehabilitation which he would definitely benefit from  (> 50% of this 15 min visit spent in face to face counseling)

## 2012-10-27 NOTE — Assessment & Plan Note (Signed)
He is clinically depressed. I think you'll benefit from seeing a psychiatrist again but he and his son are not interested in this at this point so we have clinically going to monitor

## 2012-10-27 NOTE — Progress Notes (Signed)
Pt drink 1 cup of Barium (279ml),tolerated well.Ari Bernabei RN

## 2012-10-27 NOTE — Progress Notes (Addendum)
[facility] PROGRESS NOTE  Shawn [name] NWG:[id number] DOB: [birth year] DOA: 10/22/2012 PCP: Minda Meo, MD  Assessment/Plan: 1-Neutropenic Fever:continue vanc and cefepime; no further fever episodes; cx's pending but w/o any growth up to date. Will transition to levaquin PO and treat for a total of 10 days (6 more doses starting today).  2-pancytopenia: secondary to chemotherapy tx. Will follow cell line trend and transfuse as needed; no signs of overt bleeding. Will continue holding xarelto and avoid heparin products. Oncology on board, will follow rec's. 1 unit of PRBC's given so far, Hgb 7.6 now (2/4). Platelets 38,000 today (2/4) after transfusing 2 units of platelets on 2/2. Will continue monitoring. No transfusion needed unless he is < 10,000 or actively bleeding.  3-HTN: stable, continue current regimen  4-DM: continue SSI  5-hypothyroidism: continue synthroid  6-GERD: continue PPI and carafte  7-protein calorie malnutrition (severe): will follow dietician rec's; continue Ensure TID. Patient has been encouraged to eat; unfortunately unable to tolerate anything by mouth at this point. His degree of malnutrition is escalating and family will like PEG tube placement. IR consulted and if platelets level >70,000 on 2/5, plan is to place peg tube. This will also help with medication administration.  8-esophagitis: continue PPI and carafate and also add GI coktail.  9-Deconditioning: per PT/OT evaluation results, they are recommending HHPT/HHOT; patient have 24 hour services for assistance and care and will like to go home at discharge.   10-atrial fibrillation with SVTs: Converted on 2/1 to sinus rhythm; patient require amiodarone infusion. Also one unit of PRBC was given;  HR has now remains stable. Will use low dose b/b to continue controlling his rate. xarelto to be resumed once platelets level remains stable.  DVT: SCD's  Code Status: Full Family Communication: son at  bedside Disposition Plan: home when medically stable; unless unsafe; will follow PT/OT rec's to decide on [facility] services.   Consultants:  Oncology  IR  Procedures:  See below for x-ray report  Antibiotics:  Cefepime and vanc 1/30-2/3  Levaquin 2/3 (for 6 doses to complete tx)  HPI/Subjective: Afebrile, no CP or SOB. No overt bleeding. Still complaining of retrosternal discomfort with any PO intake, liquid, solids or even medications. Continue to be very weak; will need HHPT/HHOT at discharge.  Objective: Filed Vitals:   10/26/12 1404 10/26/12 2129 10/27/12 0542 10/27/12 0632  BP:  122/56 138/70   Pulse: 82 71 66   Temp: 98 F (36.7 C) 97.5 F (36.4 C) 97.9 F (36.6 C)   TempSrc: Oral Oral Oral   Resp: 18 18 18    Height:      Weight:    85.5 kg (188 lb 7.9 oz)  SpO2: 98% 99% 98%     Intake/Output Summary (Last 24 hours) at 10/27/12 1138 Last data filed at 10/27/12 0900  Gross per 24 hour  Intake  770.1 ml  Output    750 ml  Net   20.1 ml   Filed Weights   10/25/12 0452 10/26/12 0513 10/27/12 5784  Weight: 84.4 kg (186 lb 1.1 oz) 86.6 kg (190 lb 14.7 oz) 85.5 kg (188 lb 7.9 oz)    Exam:   General:  NAD, afebrile; feeling weak; no shortness of breath; still with discomfort in his retrosternal area; but slightly better.  Cardiovascular: S1 and S2, no rubs or gallops; rate control and sinus per telemetry  Respiratory: no wheezing, no crackles  Abdomen: soft, NT, ND, positive BS  Neuro: non focal deficit  Data Reviewed:  Basic Metabolic Panel:  Lab 10/24/12 1610 10/23/12 0500 10/22/12 1515  NA 136 137 136  K 3.4* 3.5 4.9  CL 105 106 103  CO2 19 23 20   GLUCOSE 174* 71 170*  BUN 27* 24* 23  CREATININE 1.14 1.15 1.12  CALCIUM 8.5 8.4 9.3  MG 1.7 -- --  PHOS -- -- --   Liver Function Tests:  Lab 10/23/12 0500 10/22/12 1515  AST 13 20  ALT 11 15  ALKPHOS 73 98  BILITOT 1.0 1.4*  PROT 5.3* 6.6  ALBUMIN 2.6* 3.4*   CBC:  Lab 10/27/12 0445  10/26/12 0500 10/25/12 0500 10/24/12 0630 10/23/12 0500 10/22/12 1515  WBC 9.2 5.2 1.3* 0.7* 0.3* --  NEUTROABS 7.3 3.8 0.7* -- -- TOO FEW TO COUNT, SMEAR AVAILABLE FOR REVIEW  HGB 7.6* 8.1* 7.0* 9.4* 7.2* --  HCT 21.3* 23.5* 20.1* 27.1* 20.8* --  MCV 84.9 85.8 84.8 86.6 87.0 --  PLT 38* 62* 7* 16* 24* --   BNP (last 3 results)  Basename 07/29/12 1207  PROBNP 656.4*   CBG:  Lab 10/27/12 1110 10/27/12 0630 10/26/12 2127 10/26/12 1626 10/26/12 1119  GLUCAP 105* 89 110* 106* 110*    Recent Results (from the past 240 hour(s))  CULTURE, BLOOD (ROUTINE X 2)     Status: Normal (Preliminary result)   Collection Time   10/22/12  5:04 PM      Component Value Range Status Comment   Specimen Description BLOOD HAND LEFT   Final    Special Requests BOTTLES DRAWN AEROBIC ONLY 4CC   Final    Culture  Setup Time 10/22/2012 22:17   Final    Culture     Final    Value:        BLOOD CULTURE RECEIVED NO GROWTH TO DATE CULTURE WILL BE HELD FOR 5 DAYS BEFORE ISSUING A FINAL NEGATIVE REPORT   Report Status PENDING   Incomplete   URINE CULTURE     Status: Normal   Collection Time   10/22/12  5:07 PM      Component Value Range Status Comment   Specimen Description URINE, CLEAN CATCH   Final    Special Requests NONE   Final    Culture  Setup Time 10/22/2012 18:17   Final    Colony Count 6,000 COLONIES/ML   Final    Culture INSIGNIFICANT GROWTH   Final    Report Status 10/23/2012 FINAL   Final   CULTURE, BLOOD (ROUTINE X 2)     Status: Normal (Preliminary result)   Collection Time   10/22/12  5:10 PM      Component Value Range Status Comment   Specimen Description BLOOD HAND RIGHT   Final    Special Requests BOTTLES DRAWN AEROBIC ONLY 2CC   Final    Culture  Setup Time 10/22/2012 22:17   Final    Culture     Final    Value:        BLOOD CULTURE RECEIVED NO GROWTH TO DATE CULTURE WILL BE HELD FOR 5 DAYS BEFORE ISSUING A FINAL NEGATIVE REPORT   Report Status PENDING   Incomplete      Scheduled  Meds:    .  ceFAZolin (ANCEF) IV  2 g Intravenous Once  . feeding supplement  1 Container Oral TID BM  . insulin aspart  0-9 Units Subcutaneous TID WC  . lactose free nutrition  237 mL Oral TID WC  . levofloxacin  500 mg Oral Daily  .  levothyroxine  75 mcg Oral QAC breakfast  . Linaclotide  145 mcg Oral QODAY  . lisinopril  10 mg Oral q morning - 10a  . metoprolol tartrate  12.5 mg Oral BID  . pantoprazole (PROTONIX) IV  40 mg Intravenous Q12H  . potassium chloride  20 mEq Oral q morning - 10a  . prochlorperazine  10 mg Oral QHS  . senna  1 tablet Oral BID  . sucralfate  1 g Oral QID   Continuous Infusions:    . dextrose 5 % and 0.45% NaCl 50 mL/hr at 10/26/12 2352    Time spent: >30 minutes    Arif Amendola  [facility] Pager [phone]. If 8PM-8AM, please contact night-coverage at [email or web address], password [facility] 10/27/2012, 11:38 AM  LOS: 5 days

## 2012-10-28 ENCOUNTER — Inpatient Hospital Stay (HOSPITAL_COMMUNITY): Payer: Medicare Other

## 2012-10-28 DIAGNOSIS — R5081 Fever presenting with conditions classified elsewhere: Secondary | ICD-10-CM | POA: Diagnosis present

## 2012-10-28 DIAGNOSIS — D61818 Other pancytopenia: Secondary | ICD-10-CM | POA: Diagnosis present

## 2012-10-28 DIAGNOSIS — K209 Esophagitis, unspecified without bleeding: Secondary | ICD-10-CM | POA: Diagnosis present

## 2012-10-28 LAB — BASIC METABOLIC PANEL
BUN: 11 mg/dL (ref 6–23)
Chloride: 105 mEq/L (ref 96–112)
Creatinine, Ser: 1.08 mg/dL (ref 0.50–1.35)
Glucose, Bld: 115 mg/dL — ABNORMAL HIGH (ref 70–99)
Potassium: 3.4 mEq/L — ABNORMAL LOW (ref 3.5–5.1)

## 2012-10-28 LAB — CBC WITH DIFFERENTIAL/PLATELET
Basophils Relative: 0 % (ref 0–1)
HCT: 24.1 % — ABNORMAL LOW (ref 39.0–52.0)
Hemoglobin: 8.4 g/dL — ABNORMAL LOW (ref 13.0–17.0)
Lymphocytes Relative: 11 % — ABNORMAL LOW (ref 12–46)
Lymphs Abs: 1.2 10*3/uL (ref 0.7–4.0)
MCHC: 34.9 g/dL (ref 30.0–36.0)
MCV: 84.9 fL (ref 78.0–100.0)
Monocytes Relative: 14 % — ABNORMAL HIGH (ref 3–12)
Neutro Abs: 7.8 10*3/uL — ABNORMAL HIGH (ref 1.7–7.7)
RDW: 17.8 % — ABNORMAL HIGH (ref 11.5–15.5)
WBC: 10.5 10*3/uL (ref 4.0–10.5)

## 2012-10-28 LAB — GLUCOSE, CAPILLARY
Glucose-Capillary: 101 mg/dL — ABNORMAL HIGH (ref 70–99)
Glucose-Capillary: 135 mg/dL — ABNORMAL HIGH (ref 70–99)

## 2012-10-28 LAB — CULTURE, BLOOD (ROUTINE X 2)
Culture: NO GROWTH
Culture: NO GROWTH

## 2012-10-28 MED ORDER — BOOST PLUS PO LIQD: 237.0000 mL | Freq: Three times a day (TID) | ORAL | Status: AC

## 2012-10-28 MED ORDER — METOPROLOL TARTRATE 12.5 MG HALF TABLET: 12.5000 mg | ORAL_TABLET | Freq: Two times a day (BID) | ORAL | Status: AC

## 2012-10-28 MED ORDER — HEPARIN SOD (PORK) LOCK FLUSH 100 UNIT/ML IV SOLN
500.0000 [IU] | INTRAVENOUS | Status: AC | PRN
  Administered 2012-10-28: 500 [IU]

## 2012-10-28 MED ORDER — LEVOFLOXACIN 500 MG PO TABS: 500.0000 mg | ORAL_TABLET | Freq: Every day | ORAL | Status: DC

## 2012-10-28 MED ORDER — BOOST / RESOURCE BREEZE PO LIQD: 1.0000 | Freq: Three times a day (TID) | ORAL | Status: DC

## 2012-10-28 MED ORDER — POTASSIUM CHLORIDE CRYS ER 20 MEQ PO TBCR
20.0000 meq | EXTENDED_RELEASE_TABLET | Freq: Once | ORAL | Status: AC
  Administered 2012-10-28: 20 meq via ORAL
  Filled 2012-10-28 (×3): qty 1

## 2012-10-28 MED ORDER — LANSOPRAZOLE 15 MG PO CPDR: 15.0000 mg | DELAYED_RELEASE_CAPSULE | Freq: Two times a day (BID) | ORAL | Status: AC

## 2012-10-28 MED ORDER — RESOURCE THICKENUP CLEAR PO POWD: ORAL | Status: DC

## 2012-10-28 MED ORDER — SODIUM CHLORIDE 0.9 % IJ SOLN: 10.0000 mL | INTRAMUSCULAR | Status: DC | PRN

## 2012-10-28 NOTE — Discharge Summary (Addendum)
Physician Discharge Summary  Shawn [name] FAO:[id number] DOB: November 10, 1927 DOA: 10/22/2012  PCP: Minda Meo, MD  Admit date: 10/22/2012 Discharge date: 10/28/2012  Time spent: Greater than 30 minutes  Recommendations for Outpatient Follow-up:  1. With Dr. Si Gaul, oncologist in 5 days from hospital discharge with repeat labs (CBC). 2. With Dr. Geoffry Paradise, PCP in one week from hospital discharge. 3. Oral hypoglycemic agents are held secondary to poor oral intake and CBGs well controlled despite medications.Resume when oral intake improves and blood sugars start rising. Will defer to PCP. 4. Xarelto and Aspirin held secondary to severe thrombocytopenia. Resume when platelet counts have improved. Will defer to PCP.  Discharge Diagnoses:  Active Problems:  HYPERTENSION, BENIGN  Neutropenic fever  Pancytopenia  Esophagitis   Discharge Condition: Improved and stable.  Diet recommendation: Heart healthy diet.  Filed Weights   10/26/12 0513 10/27/12 0632 10/28/12 0700  Weight: 86.6 kg (190 lb 14.7 oz) 85.5 kg (188 lb 7.9 oz) 85.7 kg (188 lb 15 oz)    History of present illness:  79 year old male patient with history of small cell lung cancer, status post on current chemoradiation, hypothyroidism, type II DM, was admitted on 10/22/12 with complaints of difficulty/painful swallowing and poor oral intake. He also had some productive cough. In the emergency department he was found to be severely neutropenic and was admitted for management of same as well as esophagitis.  Hospital Course:  1. Neutropenic fever: Cultures were sent off and patient was empirically placed on IV vancomycin and cefepime. His white cell count have normalized. Cultures are negative to date. He was switched to oral Levaquin 3 days back. Continues to be afebrile. Complete total 10 days of antibiotic treatment. 2. Pancytopenia: Secondary to chemotherapy. Patient was transfused 1 unit of PRBCs. His  hemoglobin has stabilized. Platelets have been stable over the last 48 hours. Discussed with his primary oncologist who recommends that his counts may take up to 3 weeks to fully recover and has cleared him for discharge and will followup in the office with repeat labs. Xarelto and aspirin are held secondary to severe thrombocytopenia. 3. Esophagitis: Likely secondary to chemoradiation. Initially his oral intake was poor and there were plans to place a PEG tube. However in the last 48 hours his symptoms have significantly improved and patient has been tolerating oral intake well. He is to continue Carafate and twice a day PPI. 4. Hypertension: Controlled. Continue lisinopril. 5. Type II DM: Oral hypoglycemics were held. His blood sugars have been well controlled without significant dose of insulins. Continue to hold oral hypoglycemics until by mouth intake has improved. 6. Hypothyroidism: Continue Synthroid. 7. Severe protein calorie malnutrition: Continue nutritional supplements.  8. Atrial fibrillation with SVTs: Patient did require amiodarone infusion and converted to sinus rhythm. Low dose beta blockers added. As indicated above, Xarelto and Aspirin held secondary to severe thrombocytopenia. These may be resumed once his platelet counts have improved. 9. Hypokalemia: Additional dose of potassium provided prior to discharge.  Procedures:  None  Consultations:  Oncology   Discharge Exam:  Complaints Patient says that he's feeling much better and stronger and eager to go home. Ate well at breakfast  Filed Vitals:   10/27/12 2038 10/28/12 0700 10/28/12 1103 10/28/12 1438  BP: 139/74 135/64 112/67 108/52  Pulse: 71 72 72 71  Temp: 97.8 F (36.6 C) 97.3 F (36.3 C)  98.2 F (36.8 C)  TempSrc: Oral Oral  Oral  Resp: 16 17  16   Height:  Weight:  85.7 kg (188 lb 15 oz)    SpO2: 98% 95%  100%    General: NAD, pleasant and comfortably sitting on chair. Ambulated with the help of  walker and assistance. Cardiovascular: S1 and S2, no rubs or gallops; rate control and sinus per telemetry  Respiratory: no wheezing, no crackles. Clear to auscultation. No increased work of breathing.  Abdomen: soft, NT, ND, positive BS  Neuro: non focal deficit. Alert and oriented.  Extremities: Symmetric 5 x 5 power.  Discharge Instructions      Discharge Orders    Future Appointments: Provider: Department: Dept Phone: Center:   11/03/2012 10:15 AM Mauri [place] [facility] MEDICAL ONCOLOGY [phone] None   11/03/2012 10:45 AM Conni Slipper, PA [facility] [phone] None   11/03/2012 12:45 PM [facility] [place] [facility] [phone] None   11/03/2012 1:45 PM Anabel Bene, RD [facility] MEDICAL ONCOLOGY [phone] None   11/04/2012 12:30 PM [facility] [place] [facility] MED[facility] ONCOL[facility] [phone] None   11/05/2012 12:45 PM [facility] [place] [facility] MEDICAL ONCOL[facility] [phone] None   11/06/2012 11:00 AM [facility] Inj Nurse  [facility] [phone] None   11/11/2012 2:45 PM Herby Abraham, MD [facility] Main Office [place]) [phone]   11/16/2012 3:00 PM Billie Lade, MD  [facility] RAD[facility] ONCOLOGY [phone] None     Future Orders Please Complete By Expires   Diet - low sodium heart healthy      Increase activity slowly      Call MD for:      Comments:   Painful/difficulty swallowing.   Call MD for:  temperature >100.4      Call MD for:  difficulty breathing, headache or visual disturbances          Medication List     As of 10/28/2012  3:10 PM    STOP taking these medications         aspirin 81 MG tablet      glipiZIDE 10 MG 24 hr tablet   Commonly known as: GLUCOTROL XL      Rivaroxaban 15 MG Tabs tablet   Commonly known as: XARELTO      TAKE these medications          feeding supplement Liqd   Take 1 Container by mouth 3 (three) times daily between meals.      lactose free nutrition Liqd   Take 237 mLs by mouth 3 (three) times daily with meals.      furosemide 20 MG tablet   Commonly known as: LASIX   Take 20 mg by mouth every morning.      HYDROcodone-acetaminophen 7.5-325 MG per tablet   Commonly known as: NORCO   Take 1 tablet by mouth every 6 (six) hours as needed. For pain      lansoprazole 15 MG capsule   Commonly known as: PREVACID   Take 1 capsule (15 mg total) by mouth 2 (two) times daily.      levofloxacin 500 MG tablet   Commonly known as: LEVAQUIN   Take 1 tablet (500 mg total) by mouth daily.      levothyroxine 75 MCG tablet   Commonly known as: SYNTHROID, LEVOTHROID   Take 75 mcg by mouth every morning.      lidocaine-prilocaine cream   Commonly known as: EMLA   Apply topically as needed.  Apply to port 1 hour before chemotherapy, cover with plastic dressing.      LINZESS 145 MCG Caps   Generic drug: Linaclotide   Take 145 mcg by mouth every other day.      lisinopril 10 MG tablet   Commonly known as: PRINIVIL,ZESTRIL   Take 10 mg by mouth every morning.      metoprolol tartrate 12.5 mg Tabs   Commonly known as: LOPRESSOR   Take 0.5 tablets (12.5 mg total) by mouth 2 (two) times daily.      multivitamin tablet   Take 1 tablet by mouth every morning.      nitroGLYCERIN 0.4 MG SL tablet   Commonly known as: NITROSTAT   Place 0.4 mg under the tongue every 5 (five) minutes as needed. For chest pain      potassium chloride 10 MEQ tablet   Commonly known as: K-DUR,KLOR-CON   Take 10 mEq by mouth every morning.      prochlorperazine 10 MG tablet   Commonly known as: COMPAZINE   Take 10 mg by mouth at bedtime.      RESOURCE THICKENUP CLEAR Powd   By mouth, As needed      rosuvastatin 10 MG tablet   Commonly known as: CRESTOR   Take 10 mg by mouth every morning.      sucralfate 1 GM/10ML suspension    Commonly known as: CARAFATE   Take 1 g by mouth 4 (four) times daily. Refill called into [facility] [address] To Tresa Endo [facility] [phone] directions unchanged with one refill         Follow-up Information    Follow up with [facility] K., MD. Schedule an appointment as soon as possible for a visit in 5 days. (To be seen with repeat labs (CBC))    Contact information:   [address] [place] [address] [phone]       Follow up with ARONSON,RICHARD A, MD. Schedule an appointment as soon as possible for a visit in 1 week.   Contact information:   [address] [facility] [place] [address] [phone]           The results of significant diagnostics from this hospitalization (including imaging, microbiology, ancillary and laboratory) are listed below for reference.    Significant Diagnostic Studies: Dg Abd 1 View  10/28/2012  *RADIOLOGY REPORT*  Clinical Data: Bowel localization for G tube placement  ABDOMEN - 1 VIEW  Comparison: 10/27/2012  Findings: Supine abdomen shows contrast material within the lumen of a collapsed colon.  There is some mild diverticular change in the descending colon with more prominent diverticulosis in the sigmoid colon.  IMPRESSION: Contrast material opacifies the colonic lumen.   Original Report Authenticated By: Kennith Center, M.D.    Ct Chest W Contrast  09/30/2012  *RADIOLOGY REPORT*  Clinical Data: Lung cancer with ongoing chemotherapy and radiation therapy.  History of prostate cancer status post prostatectomy.  CT CHEST WITH CONTRAST  Technique:  Multidetector CT imaging of the chest was performed following the standard protocol during bolus administration of intravenous contrast.  Contrast: 80mL OMNIPAQUE IOHEXOL 300 MG/ML  SOLN  Comparison: PET CT from 06/29/2012  Findings: There is no axillary lymphadenopathy.  No mediastinal or hilar lymphadenopathy.  The patient is status post CABG no pericardial or pleural  effusion.  Lung windows show changes of emphysema.  The left suprahilar region has decreased in size in the interval. Measuring at the same level and in  the same dimensions as the previous study, the left suprahilar lesion has decreased from 2.3 x 1.7 cm on the previous study to 2.0 x 1.0 cm on the current exam.  The peripheral 13 x 13 mm focus of ground-glass attenuation in the posterolateral left upper lobe has nearly resolved.  A new 13 x 13 mm ground-glass opacities seen in the posterior right lower lobe (image 39).  Bone windows reveal no worrisome lytic or sclerotic osseous lesions.  IMPRESSION: Interval decrease in size of the left suprahilar lesion with near complete resolution of the previously identified ground-glass attenuation in the posterior left upper lobe.  New 13 mm focus of ground-glass density in the posterior right lower lobe. This is probably related to post infectious or post inflammatory alveolitis.  Continued attention on follow-up imaging recommended.   Original Report Authenticated By: Kennith Center, M.D.    Dg Chest Port 1 View  10/22/2012  *RADIOLOGY REPORT*  Clinical Data: Weakness, shortness of breath.  PORTABLE CHEST - 1 VIEW  Comparison: 08/04/2012  Findings: The prior CABG.  Mild hyperinflation/COPD.  Right Port-A- Cath is in place with the tip in the lower SVC.  Calcified granuloma in the right upper lobe.  Previously seen left suprahilar density not well appreciated by plain film.  No effusions.  No acute bony abnormality.  IMPRESSION: Hyperinflation/COPD.  Left suprahilar nodular density seen on prior CT not well appreciated on plain film.   Original Report Authenticated By: Charlett Nose, M.D.    Dg Abd Portable 1v  10/27/2012  *RADIOLOGY REPORT*  Clinical Data: Evaluation of amount of barium within the intestines prior to possible gastrostomy tube placement.  PORTABLE ABDOMEN - 1 VIEW  Comparison: None.  Findings: Residual contrast is seen within the small intestine and large  intestine.  There is some residual contrast in the left upper quadrant.  Surgical clips are seen in the abdomen and pelvis. There is slight scoliosis convexity to the left.  Changes of degenerative spondylosis are seen.  IMPRESSION: Residual contrast is seen within the large and small intestine in the abdomen or pelvis.  There is some residual contrast in the left upper quadrant.   Original Report Authenticated By: Onalee Hua Call     Microbiology: Recent Results (from the past 240 hour(s))  CULTURE, BLOOD (ROUTINE X 2)     Status: Normal   Collection Time   10/22/12  5:04 PM      Component Value Range Status Comment   Specimen Description BLOOD HAND LEFT   Final    Special Requests BOTTLES DRAWN AEROBIC ONLY 4CC   Final    Culture  Setup Time 10/22/2012 22:17   Final    Culture NO GROWTH 5 DAYS   Final    Report Status 10/28/2012 FINAL   Final   URINE CULTURE     Status: Normal   Collection Time   10/22/12  5:07 PM      Component Value Range Status Comment   Specimen Description URINE, CLEAN CATCH   Final    Special Requests NONE   Final    Culture  Setup Time 10/22/2012 18:17   Final    Colony Count 6,000 COLONIES/ML   Final    Culture INSIGNIFICANT GROWTH   Final    Report Status 10/23/2012 FINAL   Final   CULTURE, BLOOD (ROUTINE X 2)     Status: Normal   Collection Time   10/22/12  5:10 PM      Component Value Range Status  Comment   Specimen Description BLOOD HAND RIGHT   Final    Special Requests BOTTLES DRAWN AEROBIC ONLY 2CC   Final    Culture  Setup Time 10/22/2012 22:17   Final    Culture NO GROWTH 5 DAYS   Final    Report Status 10/28/2012 FINAL   Final      Labs: Basic Metabolic Panel:  Lab 10/28/12 1610 10/24/12 0002 10/23/12 0500 10/22/12 1515  NA 137 136 137 136  K 3.4* 3.4* 3.5 4.9  CL 105 105 106 103  CO2 24 19 23 20   GLUCOSE 115* 174* 71 170*  BUN 11 27* 24* 23  CREATININE 1.08 1.14 1.15 1.12  CALCIUM 9.0 8.5 8.4 9.3  MG -- 1.7 -- --  PHOS -- -- -- --    Liver Function Tests:  Lab 10/23/12 0500 10/22/12 1515  AST 13 20  ALT 11 15  ALKPHOS 73 98  BILITOT 1.0 1.4*  PROT 5.3* 6.6  ALBUMIN 2.6* 3.4*   No results found for this basename: LIPASE:5,AMYLASE:5 in the last 168 hours No results found for this basename: AMMONIA:5 in the last 168 hours CBC:  Lab 10/28/12 0824 10/27/12 0445 10/26/12 0500 10/25/12 0500 10/24/12 0630 10/22/12 1515  WBC 10.5 9.2 5.2 1.3* 0.7* --  NEUTROABS 7.8* 7.3 3.8 0.7* -- TOO FEW TO COUNT, SMEAR AVAILABLE FOR REVIEW  HGB 8.4* 7.6* 8.1* 7.0* 9.4* --  HCT 24.1* 21.3* 23.5* 20.1* 27.1* --  MCV 84.9 84.9 85.8 84.8 86.6 --  PLT 39* 38* 62* 7* 16* --   Cardiac Enzymes: No results found for this basename: CKTOTAL:5,CKMB:5,CKMBINDEX:5,TROPONINI:5 in the last 168 hours BNP: BNP (last 3 results)  Basename 07/29/12 1207  PROBNP 656.4*   CBG:  Lab 10/28/12 1248 10/28/12 0710 10/27/12 2041 10/27/12 1631 10/27/12 1110  GLUCAP 135* 101* 112* 118* 105*       Signed:  HONGALGI,ANAND  [facility] 10/28/2012, 3:10 PM

## 2012-10-28 NOTE — Progress Notes (Signed)
PT Cancellation Note  Patient Details Name: LATEEF JUNCAJ MRN: [id number] DOB: Oct 21, 1927   Cancelled Treatment:    Reason Eval/Treat Not Completed:  (Refused PT, had ambulated with family in am)   INGOLD,Dessie Delcarlo 10/28/2012, 11:06 AM [facility] [phone] (pager)

## 2012-11-03 ENCOUNTER — Other Ambulatory Visit: Payer: Medicare Other | Admitting: Lab

## 2012-11-03 ENCOUNTER — Ambulatory Visit: Payer: Medicare Other

## 2012-11-03 ENCOUNTER — Encounter: Payer: Self-pay | Admitting: Physician Assistant

## 2012-11-03 ENCOUNTER — Telehealth: Payer: Self-pay | Admitting: *Deleted

## 2012-11-03 ENCOUNTER — Telehealth: Payer: Self-pay | Admitting: Internal Medicine

## 2012-11-03 ENCOUNTER — Ambulatory Visit (HOSPITAL_BASED_OUTPATIENT_CLINIC_OR_DEPARTMENT_OTHER): Payer: Medicare Other | Admitting: Physician Assistant

## 2012-11-03 ENCOUNTER — Encounter: Payer: Medicare Other | Admitting: Nutrition

## 2012-11-03 VITALS — BP 120/46 | HR 57 | Temp 97.3°F | Resp 20 | Ht 71.0 in | Wt 187.7 lb

## 2012-11-03 DIAGNOSIS — C3490 Malignant neoplasm of unspecified part of unspecified bronchus or lung: Secondary | ICD-10-CM

## 2012-11-03 DIAGNOSIS — C3400 Malignant neoplasm of unspecified main bronchus: Secondary | ICD-10-CM

## 2012-11-03 NOTE — Progress Notes (Signed)
[facility] [phone]   [phone]  OFFICE PROGRESS NOTE  Minda Meo, MD [address] [facility], [place]. [place] [address]  DIAGNOSIS: Extensive stage small cell lung cancer with questionable small metastatic focus in the posterior left temporal/occipital lobe Junction of the brain diagnosed in November of 2013.   PRIOR THERAPY: Status post concurrent radiotherapy under the care of Dr. Roselind Messier   CURRENT THERAPY: Systemic chemotherapy with carboplatin for an AUC of 5 given on day 1 and etoposide at 100 mg per meter squared given on days 1, 2 and 3 with Neulasta support given on day 4 status post 3 cycles.   INTERVAL HISTORY: Shawn [name] 79 y.o. male returns to the clinic today for followup visit accompanied his 2 sons.  The patient was admitted to the hospital from 10/22/2012 through 10/28/2012. He was admitted for neutropenic fever, pancytopenia and dysphagia. His sons report that while [name] in the hospital he had atrial fibrillation and was treated with an adenosine drip. His heart rate normalized. His glipizide, Zaroxolyn aspirin were held. He is to followup with the prescribing doctors for those medications regarding restarting them. He continues to have some back pain as well as some right-sided throat pain with swallowing. He had a few episodes of diarrhea over the weekend but primarily has had problems with constipation. He reports that he likes the chocolate boost but feels that contributes to the diarrhea and constipation. He notes some occasional dizziness with walking but denies any falls. Having recently been discharged from the hospital the patient would like to postpone his chemotherapy by one week.  He denied having any significant chest pain but continues to have shortness breath with exertion, no cough or hemoptysis.   MEDICAL HISTORY: Past Medical History  Diagnosis Date  . CAD (coronary artery  disease)     a. 1990 CABGx3: LIMA->LAD, VG->OM, VG->dRCA;  b. 05/2007 VF Arrest/Cath/PCI: 100 VG->OM tx w/ BMS, 95 VG->RCA tx w/ bms;  c. 06/2007 90 Native PDA tx w DES ;  d. 05/2009 Echo EF 60-65%, nl WM, mild MR; e. normal lexiscan myoview 03/30/12  . HTN (hypertension)   . Hypercholesteremia   . Atrial fibrillation     a. had been on tikosyn -> d/c 09/2010;  b. Was on Amio ->d/c 09/2011;  c.  anticoagulated w/ Pradaxa  . Junctional ectopic tachycardia     a. noted 09/2010  . Sinus bradycardia   . Back pain     a. Followed by [facility] - on prednisone  . Prostate cancer   . Unsteady gait     a. in rehab for core strengthening  . Colitis   . Tubular adenoma of colon 02/2007  . Myocardial infarction   . Arthritis   . CHF (congestive heart failure)   . Depression   . Internal hemorrhoids without mention of complication   . Diverticulosis of colon (without mention of hemorrhage)   . Diabetes mellitus     Type 2 NIDDM x 15 years  . CKD (chronic kidney disease), stage III     pt denies CKD  . GERD (gastroesophageal reflux disease)     ALLERGIES:  has No Known Allergies.  MEDICATIONS:  Current Outpatient Prescriptions  Medication Sig Dispense Refill  . feeding supplement (RESOURCE BREEZE) LIQD Take 1 Container by mouth 3 (three) times daily between meals.      . furosemide (LASIX) 20 MG tablet Take 20 mg by mouth  every morning.      Marland Kitchen GLIPIZIDE XL 10 MG 24 hr tablet       . HYDROcodone-acetaminophen (NORCO) 7.5-325 MG per tablet Take 1 tablet by mouth every 6 (six) hours as needed. For pain      . lactose free nutrition (BOOST PLUS) LIQD Take 237 mLs by mouth 3 (three) times daily with meals.      . lansoprazole (PREVACID) 15 MG capsule Take 1 capsule (15 mg total) by mouth 2 (two) times daily.  30 capsule  0  . levofloxacin (LEVAQUIN) 500 MG tablet Take 1 tablet (500 mg total) by mouth daily.  4 tablet  0  . levothyroxine (SYNTHROID, LEVOTHROID) 75 MCG tablet Take 75 mcg by mouth every  morning.       . lidocaine-prilocaine (EMLA) cream Apply topically as needed. Apply to port 1 hour before chemotherapy, cover with plastic dressing.  30 g  0  . Linaclotide (LINZESS) 145 MCG CAPS Take 145 mcg by mouth every other day.      . lisinopril (PRINIVIL,ZESTRIL) 10 MG tablet Take 10 mg by mouth every morning.      . Maltodextrin-Xanthan Gum (RESOURCE THICKENUP CLEAR) POWD By mouth, As needed      . methylPREDNIsolone (MEDROL DOSPACK) 4 MG tablet       . metoprolol tartrate (LOPRESSOR) 12.5 mg TABS Take 0.5 tablets (12.5 mg total) by mouth 2 (two) times daily.  60 tablet  0  . Multiple Vitamin (MULTIVITAMIN) tablet Take 1 tablet by mouth every morning.       . nitroGLYCERIN (NITROSTAT) 0.4 MG SL tablet Place 0.4 mg under the tongue every 5 (five) minutes as needed. For chest pain      . potassium chloride (K-DUR,KLOR-CON) 10 MEQ tablet Take 10 mEq by mouth every morning.       . prochlorperazine (COMPAZINE) 10 MG tablet Take 10 mg by mouth at bedtime.      . rosuvastatin (CRESTOR) 10 MG tablet Take 10 mg by mouth every morning.      . sucralfate (CARAFATE) 1 GM/10ML suspension Take 1 g by mouth 4 (four) times daily. Refill called into [facility] [address] To Tresa Endo [facility] [phone] directions unchanged with one refill      . XARELTO 15 MG TABS tablet       . [DISCONTINUED] rivastigmine (EXELON) 4.6 mg/24hr Place 1 patch onto the skin daily.       No current facility-administered medications for this visit.    SURGICAL HISTORY:  Past Surgical History  Procedure Laterality Date  . Coronary artery bypass graft  1990  . Prostate surgery  01/1992    retropubic prostatectomy  . Cardiac valve surgery    . Coronary angioplasty with stent placement  2008    x 3   . Rt finger trigger a-1 pulley    . Cholecystectomy  05/05/12  . Cholecystectomy  05/05/2012    Procedure: LAPAROSCOPIC CHOLECYSTECTOMY WITH INTRAOPERATIVE CHOLANGIOGRAM;  Surgeon: Mariella Saa, MD;  Location: [facility] OR;   Service: General;  Laterality: N/A;  . Endobronchial ultrasound  08/04/2012    Procedure: ENDOBRONCHIAL ULTRASOUND;  Surgeon: Kalman Shan, MD;  Location: [facility] OR;  Service: Cardiopulmonary;  Laterality: N/A;  . Video bronchoscopy  08/04/2012    Procedure: VIDEO BRONCHOSCOPY WITH FLUORO;  Surgeon: Kalman Shan, MD;  Location: [facility] OR;  Service: Cardiopulmonary;  Laterality: N/A;    REVIEW OF SYSTEMS:  Pertinent items are noted in HPI.   PHYSICAL EXAMINATION: General appearance:  alert, cooperative and no distress Head: Normocephalic, without obvious abnormality, atraumatic Neck: no adenopathy Lymph nodes: Cervical, supraclavicular, and axillary nodes normal. Resp: clear to auscultation bilaterally Back: symmetric, no curvature. ROM normal. No CVA tenderness. Cardio: regular rate and rhythm, S1, S2 normal, no murmur, click, rub or gallop GI: soft, non-tender; bowel sounds normal; no masses,  no organomegaly Extremities: extremities normal, atraumatic, no cyanosis or edema Neurologic: Alert and oriented X 3, normal strength and tone. Normal symmetric reflexes. Normal coordination and gait  ECOG PERFORMANCE STATUS: 1 - Symptomatic but completely ambulatory  Blood pressure 120/46, pulse 57, temperature 97.3 F (36.3 C), temperature source Oral, resp. rate 20, height 5\' 11"  (1.803 m), weight 187 lb 11.2 oz (85.14 kg).  LABORATORY DATA: Lab Results  Component Value Date   WBC 10.5 10/28/2012   HGB 8.4* 10/28/2012   HCT 24.1* 10/28/2012   MCV 84.9 10/28/2012   PLT 39* 10/28/2012      Chemistry      Component Value Date/Time   NA 137 10/28/2012 0824   NA 137 10/13/2012 1256   K 3.4* 10/28/2012 0824   K 4.6 10/13/2012 1256   CL 105 10/28/2012 0824   CL 102 10/13/2012 1256   CO2 24 10/28/2012 0824   CO2 25 10/13/2012 1256   BUN 11 10/28/2012 0824   BUN 28.0* 10/13/2012 1256   CREATININE 1.08 10/28/2012 0824   CREATININE 1.4* 10/13/2012 1256   CREATININE 1.48* 05/11/2012 1030      Component Value  Date/Time   CALCIUM 9.0 10/28/2012 0824   CALCIUM 9.0 10/13/2012 1256   ALKPHOS 73 10/23/2012 0500   ALKPHOS 80 10/13/2012 1256   AST 13 10/23/2012 0500   AST 18 10/13/2012 1256   ALT 11 10/23/2012 0500   ALT 18 10/13/2012 1256   BILITOT 1.0 10/23/2012 0500   BILITOT 0.41 10/13/2012 1256       RADIOGRAPHIC STUDIES: Ct Chest W Contrast  09/30/2012  *RADIOLOGY REPORT*  Clinical Data: Lung cancer with ongoing chemotherapy and radiation therapy.  History of prostate cancer status post prostatectomy.  CT CHEST WITH CONTRAST  Technique:  Multidetector CT imaging of the chest was performed following the standard protocol during bolus administration of intravenous contrast.  Contrast: 80mL OMNIPAQUE IOHEXOL 300 MG/ML  SOLN  Comparison: PET CT from 06/29/2012  Findings: There is no axillary lymphadenopathy.  No mediastinal or hilar lymphadenopathy.  The patient is status post CABG no pericardial or pleural effusion.  Lung windows show changes of emphysema.  The left suprahilar region has decreased in size in the interval. Measuring at the same level and in the same dimensions as the previous study, the left suprahilar lesion has decreased from 2.3 x 1.7 cm on the previous study to 2.0 x 1.0 cm on the current exam.  The peripheral 13 x 13 mm focus of ground-glass attenuation in the posterolateral left upper lobe has nearly resolved.  A new 13 x 13 mm ground-glass opacities seen in the posterior right lower lobe (image 39).  Bone windows reveal no worrisome lytic or sclerotic osseous lesions.  IMPRESSION: Interval decrease in size of the left suprahilar lesion with near complete resolution of the previously identified ground-glass attenuation in the posterior left upper lobe.  New 13 mm focus of ground-glass density in the posterior right lower lobe. This is probably related to post infectious or post inflammatory alveolitis.  Continued attention on follow-up imaging recommended.   Original Report Authenticated By: Kennith Center, M.D.  ASSESSMENT/PLAN: This is a very pleasant 79 years old white male with extensive stage small cell lung cancer secondary to presence of questionable brain metastasis. The patient is currently undergoing systemic chemotherapy with carboplatin and etoposide status post 3 cycles concurrent with radiation. He had significant improvement in his disease. The patient was discussed with Dr. Arbutus Ped. We will postpone cycle #4 x 1 week. He will return in one week with repeat CBC differential and C. met and proceed with cycle #4 of his systemic chemotherapy with carboplatin and etoposide with Neulasta support. He is encouraged to followup with Dr. Selina Cooley he and Dr. Lorain Childes regarding these are relative oh and aspirin and glipizide respectively. He is encouraged to increase his by mouth intake as tolerated. He'll followup with Dr. Arbutus Ped in 4 weeks with a repeat CBC differential, C. met and CT of the chest, abdomen and pelvis with contrast to reevaluate his disease. The patient and his sons are in agreement with the plan.  Memphis Decoteau E, PA-C   He was advised to call immediately if he has any concerning symptoms in the interval.  All questions were answered. The patient knows to call the clinic with any problems, questions or concerns. We can certainly see the patient much sooner if necessary.  I spent 20 minutes counseling the patient face to face. The total time spent in the appointment was 30 minutes.

## 2012-11-03 NOTE — Patient Instructions (Addendum)
As you just recently been discharged from the hospital, we'll postpone your chemotherapy by one week. Followup with Dr. Arbutus Ped in 4 weeks with repeat labs as well as a restaging CT scan of your chest, abdomen and pelvis to reevaluate your disease

## 2012-11-03 NOTE — Telephone Encounter (Signed)
Gave pt appt for lab, Md and chemo March 2014

## 2012-11-03 NOTE — Telephone Encounter (Signed)
Per staff message and POF I have scheduled appts.  JMW  

## 2012-11-04 ENCOUNTER — Ambulatory Visit: Payer: Medicare Other

## 2012-11-05 ENCOUNTER — Ambulatory Visit: Payer: Medicare Other

## 2012-11-06 ENCOUNTER — Ambulatory Visit: Payer: Medicare Other

## 2012-11-10 ENCOUNTER — Ambulatory Visit: Payer: Medicare Other | Admitting: Nutrition

## 2012-11-10 ENCOUNTER — Other Ambulatory Visit: Payer: Self-pay | Admitting: Medical Oncology

## 2012-11-10 ENCOUNTER — Other Ambulatory Visit: Payer: Self-pay | Admitting: Internal Medicine

## 2012-11-10 ENCOUNTER — Other Ambulatory Visit (HOSPITAL_BASED_OUTPATIENT_CLINIC_OR_DEPARTMENT_OTHER): Payer: Medicare Other | Admitting: Lab

## 2012-11-10 ENCOUNTER — Ambulatory Visit (HOSPITAL_BASED_OUTPATIENT_CLINIC_OR_DEPARTMENT_OTHER): Payer: Medicare Other

## 2012-11-10 VITALS — BP 125/46 | Temp 97.5°F

## 2012-11-10 DIAGNOSIS — C3400 Malignant neoplasm of unspecified main bronchus: Secondary | ICD-10-CM

## 2012-11-10 DIAGNOSIS — Z5111 Encounter for antineoplastic chemotherapy: Secondary | ICD-10-CM

## 2012-11-10 DIAGNOSIS — C3490 Malignant neoplasm of unspecified part of unspecified bronchus or lung: Secondary | ICD-10-CM

## 2012-11-10 LAB — COMPREHENSIVE METABOLIC PANEL (CC13)
BUN: 13.8 mg/dL (ref 7.0–26.0)
CO2: 24 mEq/L (ref 22–29)
Calcium: 8.4 mg/dL (ref 8.4–10.4)
Chloride: 109 mEq/L — ABNORMAL HIGH (ref 98–107)
Creatinine: 0.9 mg/dL (ref 0.7–1.3)
Glucose: 119 mg/dl — ABNORMAL HIGH (ref 70–99)
Total Bilirubin: 0.66 mg/dL (ref 0.20–1.20)

## 2012-11-10 LAB — CBC WITH DIFFERENTIAL/PLATELET
Basophils Absolute: 0 10*3/uL (ref 0.0–0.1)
Eosinophils Absolute: 0 10*3/uL (ref 0.0–0.5)
HCT: 23.4 % — ABNORMAL LOW (ref 38.4–49.9)
HGB: 8.2 g/dL — ABNORMAL LOW (ref 13.0–17.1)
LYMPH%: 16.8 % (ref 14.0–49.0)
MCHC: 34.9 g/dL (ref 32.0–36.0)
MONO#: 0.6 10*3/uL (ref 0.1–0.9)
NEUT%: 66.4 % (ref 39.0–75.0)
Platelets: 171 10*3/uL (ref 140–400)
WBC: 4.1 10*3/uL (ref 4.0–10.3)

## 2012-11-10 MED ORDER — SODIUM CHLORIDE 0.9 % IV SOLN
Freq: Once | INTRAVENOUS | Status: AC
  Administered 2012-11-10: 10:00:00 via INTRAVENOUS

## 2012-11-10 MED ORDER — CARBOPLATIN CHEMO INJECTION 450 MG/45ML
350.0000 mg | Freq: Once | INTRAVENOUS | Status: AC
  Administered 2012-11-10: 350 mg via INTRAVENOUS
  Filled 2012-11-10: qty 35

## 2012-11-10 MED ORDER — SODIUM CHLORIDE 0.9 % IJ SOLN
10.0000 mL | INTRAMUSCULAR | Status: DC | PRN
  Administered 2012-11-10: 10 mL
  Filled 2012-11-10: qty 10

## 2012-11-10 MED ORDER — ONDANSETRON 16 MG/50ML IVPB (CHCC)
16.0000 mg | Freq: Once | INTRAVENOUS | Status: AC
  Administered 2012-11-10: 16 mg via INTRAVENOUS

## 2012-11-10 MED ORDER — DEXAMETHASONE SODIUM PHOSPHATE 4 MG/ML IJ SOLN
20.0000 mg | Freq: Once | INTRAMUSCULAR | Status: AC
  Administered 2012-11-10: 20 mg via INTRAVENOUS

## 2012-11-10 MED ORDER — SODIUM CHLORIDE 0.9 % IV SOLN
90.0000 mg/m2 | Freq: Once | INTRAVENOUS | Status: AC
  Administered 2012-11-10: 200 mg via INTRAVENOUS
  Filled 2012-11-10: qty 10

## 2012-11-10 MED ORDER — HEPARIN SOD (PORK) LOCK FLUSH 100 UNIT/ML IV SOLN
500.0000 [IU] | Freq: Once | INTRAVENOUS | Status: AC | PRN
  Administered 2012-11-10: 500 [IU]
  Filled 2012-11-10: qty 5

## 2012-11-10 NOTE — Telephone Encounter (Signed)
Asking if proceeding with chemo contingent upon labs and I told him yes

## 2012-11-10 NOTE — Patient Instructions (Signed)
[facility] Discharge Instructions for Patients Receiving Chemotherapy  Today you received the following chemotherapy agents :  Carboplatin, VP16.   To help prevent nausea and vomiting after your treatment, we encourage you to take your nausea medication.    If you develop nausea and vomiting that is not controlled by your nausea medication, call the clinic. If it is after clinic hours your family physician or the after hours number for the clinic or go to the Emergency Department.   BELOW ARE SYMPTOMS THAT SHOULD BE REPORTED IMMEDIATELY:  *FEVER GREATER THAN 100.5 F  *CHILLS WITH OR WITHOUT FEVER  NAUSEA AND VOMITING THAT IS NOT CONTROLLED WITH YOUR NAUSEA MEDICATION  *UNUSUAL SHORTNESS OF BREATH  *UNUSUAL BRUISING OR BLEEDING  TENDERNESS IN MOUTH AND THROAT WITH OR WITHOUT PRESENCE OF ULCERS  *URINARY PROBLEMS  *BOWEL PROBLEMS  UNUSUAL RASH Items with * indicate a potential emergency and should be followed up as soon as possible.  Feel free to call the clinic you have any questions or concerns. The clinic phone number is [phone].

## 2012-11-10 NOTE — Progress Notes (Signed)
I followed up with patient today in chemotherapy. He reports his appetite continues to be poor. His diet has good variety and he tolerates most foods without difficulty. He drinks boost one time daily. His weight continues to decline slightly and was documented as 187.7 pounds February 11 from 188.9 on February 5.. He has no questions or concerns.  Nutrition diagnosis: Food and nutrition related knowledge deficit resolved.  Patient has a wonderful, supportive family who provides him with a variety of healthy foods to consume. He continues to drink boost and I provided additional coupons for him today. He hopes that he will not need further chemotherapy after the end of this week. Patient is appreciative of nutrition information and agrees to contact me if he develops problems or questions.

## 2012-11-11 ENCOUNTER — Ambulatory Visit (INDEPENDENT_AMBULATORY_CARE_PROVIDER_SITE_OTHER): Payer: Medicare Other | Admitting: Cardiology

## 2012-11-11 ENCOUNTER — Ambulatory Visit (HOSPITAL_BASED_OUTPATIENT_CLINIC_OR_DEPARTMENT_OTHER): Payer: Medicare Other

## 2012-11-11 ENCOUNTER — Encounter: Payer: Self-pay | Admitting: Cardiology

## 2012-11-11 VITALS — BP 120/45 | HR 63 | Temp 97.3°F

## 2012-11-11 VITALS — BP 114/48 | HR 57 | Ht 71.0 in | Wt 195.0 lb

## 2012-11-11 DIAGNOSIS — I4891 Unspecified atrial fibrillation: Secondary | ICD-10-CM

## 2012-11-11 DIAGNOSIS — C3490 Malignant neoplasm of unspecified part of unspecified bronchus or lung: Secondary | ICD-10-CM

## 2012-11-11 DIAGNOSIS — E876 Hypokalemia: Secondary | ICD-10-CM

## 2012-11-11 MED ORDER — PROCHLORPERAZINE MALEATE 10 MG PO TABS
10.0000 mg | ORAL_TABLET | Freq: Once | ORAL | Status: AC
  Administered 2012-11-11: 10 mg via ORAL

## 2012-11-11 MED ORDER — SODIUM CHLORIDE 0.9 % IJ SOLN
10.0000 mL | INTRAMUSCULAR | Status: DC | PRN
  Administered 2012-11-11: 10 mL
  Filled 2012-11-11: qty 10

## 2012-11-11 MED ORDER — SODIUM CHLORIDE 0.9 % IV SOLN
Freq: Once | INTRAVENOUS | Status: AC
  Administered 2012-11-11: 10:00:00 via INTRAVENOUS

## 2012-11-11 MED ORDER — SODIUM CHLORIDE 0.9 % IV SOLN
90.0000 mg/m2 | Freq: Once | INTRAVENOUS | Status: AC
  Administered 2012-11-11: 200 mg via INTRAVENOUS
  Filled 2012-11-11: qty 10

## 2012-11-11 MED ORDER — HEPARIN SOD (PORK) LOCK FLUSH 100 UNIT/ML IV SOLN
500.0000 [IU] | Freq: Once | INTRAVENOUS | Status: AC | PRN
  Administered 2012-11-11: 500 [IU]
  Filled 2012-11-11: qty 5

## 2012-11-11 NOTE — Assessment & Plan Note (Signed)
His platelet counts come off, but his hemoglobin remains 8.2. It is down slightly from the last visit. As such, we will hold off with reinstitution of his anti-thrombin therapy. I will reassess his situation approximately 2 weeks, and we will make a decision about what the best strategy might be.

## 2012-11-11 NOTE — Patient Instructions (Signed)
[facility] Discharge Instructions for Patients Receiving Chemotherapy  Today you received the following chemotherapy agents etoposide  To help prevent nausea and vomiting after your treatment, we encourage you to take your nausea medication as needed   If you develop nausea and vomiting that is not controlled by your nausea medication, call the clinic. If it is after clinic hours your family physician or the after hours number for the clinic or go to the Emergency Department.   BELOW ARE SYMPTOMS THAT SHOULD BE REPORTED IMMEDIATELY:  *FEVER GREATER THAN 100.5 F  *CHILLS WITH OR WITHOUT FEVER  NAUSEA AND VOMITING THAT IS NOT CONTROLLED WITH YOUR NAUSEA MEDICATION  *UNUSUAL SHORTNESS OF BREATH  *UNUSUAL BRUISING OR BLEEDING  TENDERNESS IN MOUTH AND THROAT WITH OR WITHOUT PRESENCE OF ULCERS  *URINARY PROBLEMS  *BOWEL PROBLEMS  UNUSUAL RASH Items with * indicate a potential emergency and should be followed up as soon as possible.  The clinic phone number is [phone].

## 2012-11-11 NOTE — Patient Instructions (Addendum)
Increase your potassium to daily for 1 week. Then decrease back to normal dose of Potassium daily.  Follow-up with Dr. Riley Kill in 2 weeks

## 2012-11-11 NOTE — Assessment & Plan Note (Signed)
No current chest pain

## 2012-11-11 NOTE — Assessment & Plan Note (Signed)
Under care of oncology °

## 2012-11-11 NOTE — Assessment & Plan Note (Signed)
K yesterday was low at 3.4, so we will increase his KDUR to daily, and continue for one week, then reduce back to one daily.  He will recheck when he is seen back in follow up in two weeks.

## 2012-11-11 NOTE — Progress Notes (Signed)
HPI:  This patient seen today in followup. Associated with his cancer therapy, his recent platelet count fell to as low as 7000. His anticoagulant was stopped at that point in time. A number of his medicines were altered. He is now on metoprolol. He's not had what seemed like a recurrence of arrhythmia. He's not on anticoagulation at the present time his platelet count has returned, but his hemoglobin remains at 8.2.   He  currently has no chest pain.  His swallowing has improved some, and he is starting to improve his nutrition a bit.    Current Outpatient Prescriptions  Medication Sig Dispense Refill  . feeding supplement (RESOURCE BREEZE) LIQD Take 1 Container by mouth 3 (three) times daily between meals.      . furosemide (LASIX) 20 MG tablet Take 20 mg by mouth every morning.      Marland Kitchen GLIPIZIDE XL 10 MG 24 hr tablet       . HYDROcodone-acetaminophen (NORCO) 7.5-325 MG per tablet Take 1 tablet by mouth every 6 (six) hours as needed. For pain      . lactose free nutrition (BOOST PLUS) LIQD Take 237 mLs by mouth 3 (three) times daily with meals.      . lansoprazole (PREVACID) 15 MG capsule Take 1 capsule (15 mg total) by mouth 2 (two) times daily.  30 capsule  0  . levofloxacin (LEVAQUIN) 500 MG tablet Take 1 tablet (500 mg total) by mouth daily.  4 tablet  0  . levothyroxine (SYNTHROID, LEVOTHROID) 75 MCG tablet Take 75 mcg by mouth every morning.       Marland Kitchen lisinopril (PRINIVIL,ZESTRIL) 10 MG tablet Take 10 mg by mouth every morning.      . metoprolol tartrate (LOPRESSOR) 12.5 mg TABS Take 0.5 tablets (12.5 mg total) by mouth 2 (two) times daily.  60 tablet  0  . Multiple Vitamin (MULTIVITAMIN) tablet Take 1 tablet by mouth every morning.       . nitroGLYCERIN (NITROSTAT) 0.4 MG SL tablet Place 0.4 mg under the tongue every 5 (five) minutes as needed. For chest pain      . potassium chloride (K-DUR,KLOR-CON) 10 MEQ tablet Take 10 mEq by mouth every morning.       . prochlorperazine (COMPAZINE) 10  MG tablet Take 10 mg by mouth at bedtime.      . rosuvastatin (CRESTOR) 10 MG tablet Take 10 mg by mouth every morning.      . sucralfate (CARAFATE) 1 GM/10ML suspension Take 1 g by mouth 4 (four) times daily. Refill called into [facility] [address] To Tresa Endo [facility] [phone] directions unchanged with one refill      . lidocaine-prilocaine (EMLA) cream Apply 1 application topically as needed. Apply to port 1 hour before chemotherapy, cover with plastic dressing.      . Linaclotide (LINZESS) 145 MCG CAPS Take 145 mcg by mouth as needed.       . [DISCONTINUED] rivastigmine (EXELON) 4.6 mg/24hr Place 1 patch onto the skin daily.       No current facility-administered medications for this visit.    No Known Allergies  Past Medical History  Diagnosis Date  . CAD (coronary artery disease)     a. 1990 CABGx3: LIMA->LAD, VG->OM, VG->dRCA;  b. 05/2007 VF Arrest/Cath/PCI: 100 VG->OM tx w/ BMS, 95 VG->RCA tx w/ bms;  c. 06/2007 90 Native PDA tx w DES ;  d. 05/2009 Echo EF 60-65%, nl WM, mild MR; e. normal lexiscan myoview 03/30/12  .  HTN (hypertension)   . Hypercholesteremia   . Atrial fibrillation     a. had been on tikosyn -> d/c 09/2010;  b. Was on Amio ->d/c 09/2011;  c.  anticoagulated w/ Pradaxa  . Junctional ectopic tachycardia     a. noted 09/2010  . Sinus bradycardia   . Back pain     a. Followed by [facility] - on prednisone  . Prostate cancer   . Unsteady gait     a. in rehab for core strengthening  . Colitis   . Tubular adenoma of colon 02/2007  . Myocardial infarction   . Arthritis   . CHF (congestive heart failure)   . Depression   . Internal hemorrhoids without mention of complication   . Diverticulosis of colon (without mention of hemorrhage)   . Diabetes mellitus     Type 2 NIDDM x 15 years  . CKD (chronic kidney disease), stage III     pt denies CKD  . GERD (gastroesophageal reflux disease)     Past Surgical History  Procedure Laterality Date  . Coronary artery bypass graft   1990  . Prostate surgery  01/1992    retropubic prostatectomy  . Cardiac valve surgery    . Coronary angioplasty with stent placement  2008    x 3   . Rt finger trigger a-1 pulley    . Cholecystectomy  05/05/12  . Cholecystectomy  05/05/2012    Procedure: LAPAROSCOPIC CHOLECYSTECTOMY WITH INTRAOPERATIVE CHOLANGIOGRAM;  Surgeon: Mariella Saa, MD;  Location: [facility] OR;  Service: General;  Laterality: N/A;  . Endobronchial ultrasound  08/04/2012    Procedure: ENDOBRONCHIAL ULTRASOUND;  Surgeon: Kalman Shan, MD;  Location: [facility] OR;  Service: Cardiopulmonary;  Laterality: N/A;  . Video bronchoscopy  08/04/2012    Procedure: VIDEO BRONCHOSCOPY WITH FLUORO;  Surgeon: Kalman Shan, MD;  Location: [facility] OR;  Service: Cardiopulmonary;  Laterality: N/A;    Family History  Problem Relation Age of Onset  . Other Father     died of unknown cause @ young age  . Coronary artery disease Mother     died in late 59's w/ "hardening of the arteries"  . Heart disease Mother   . Heart disease Son   . Diabetes Son   . Colon cancer Neg Hx     History   Social History  . Marital Status: Widowed    Spouse Name: N/A    Number of Children: 4  . Years of Education: N/A   Occupational History  . 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 Smoking status: Former Smoker -- 40 years    Types: Cigarettes    Quit date: 09/23/1988  . Smokeless tobacco: Never Used     Comment: quit 20+ years ago  . Alcohol Use: No  . Drug Use: No  . Sexually Active: No   Other Topics Concern  . Not on file   Social History Narrative   The patient resides in [place] alone.  He is widowed.  He has 4 children, 9 grandchildren.  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  He denies any alcohol, drugs, or herbal medication.  He tries  to maintain a low-fat diet.  He states that he does exercise somewhat with walking, and he uses a stationary bike for a few minutes every  other day.          ROS: Please see the  HPI.  All other systems reviewed and negative.  PHYSICAL EXAM:  BP 114/48  Pulse 57  Ht 5\' 11"  (1.803 m)  Wt 195 lb (88.451 kg)  BMI 27.21 kg/m2  SpO2 90%  General: Very delightful chronically ill appearing gentleman in no distress.   Head:  Covered with hat, hair loss with therapy.   Neck: no JVD Lungs: Clear to auscultation and percussion. Heart: Normal S1 and S2.  No murmur, rubs or gallops.  Pulses: Pulses normal in all 4 extremities. Extremities: No clubbing or cyanosis. 1-2 plus edema left slightly worse than right.   Neurologic: Alert and oriented x 3.  EKG:  SB.  Low voltage QRS.  Borderline ECG.    ASSESSMENT AND PLAN:

## 2012-11-12 ENCOUNTER — Ambulatory Visit (HOSPITAL_BASED_OUTPATIENT_CLINIC_OR_DEPARTMENT_OTHER): Payer: Medicare Other

## 2012-11-12 VITALS — BP 139/51 | HR 58 | Temp 98.5°F | Resp 20

## 2012-11-12 DIAGNOSIS — Z5111 Encounter for antineoplastic chemotherapy: Secondary | ICD-10-CM

## 2012-11-12 DIAGNOSIS — C3490 Malignant neoplasm of unspecified part of unspecified bronchus or lung: Secondary | ICD-10-CM

## 2012-11-12 MED ORDER — HEPARIN SOD (PORK) LOCK FLUSH 100 UNIT/ML IV SOLN
500.0000 [IU] | Freq: Once | INTRAVENOUS | Status: AC | PRN
  Administered 2012-11-12: 500 [IU]
  Filled 2012-11-12: qty 5

## 2012-11-12 MED ORDER — SODIUM CHLORIDE 0.9 % IV SOLN
Freq: Once | INTRAVENOUS | Status: AC
  Administered 2012-11-12: 10:00:00 via INTRAVENOUS

## 2012-11-12 MED ORDER — SODIUM CHLORIDE 0.9 % IV SOLN
90.0000 mg/m2 | Freq: Once | INTRAVENOUS | Status: AC
  Administered 2012-11-12: 200 mg via INTRAVENOUS
  Filled 2012-11-12: qty 10

## 2012-11-12 MED ORDER — SODIUM CHLORIDE 0.9 % IJ SOLN
10.0000 mL | INTRAMUSCULAR | Status: DC | PRN
  Administered 2012-11-12: 10 mL
  Filled 2012-11-12: qty 10

## 2012-11-12 MED ORDER — PROCHLORPERAZINE MALEATE 10 MG PO TABS
10.0000 mg | ORAL_TABLET | Freq: Once | ORAL | Status: AC
  Administered 2012-11-12: 10 mg via ORAL

## 2012-11-12 NOTE — Patient Instructions (Signed)
[facility] Discharge Instructions for Patients Receiving Chemotherapy  Today you received the following chemotherapy agents: Etoposide.  To help prevent nausea and vomiting after your treatment, we encourage you to take your nausea medication, Prochlorperazine (Compazine). Begin taking it at bedtime and take it as often as prescribed for the next 48 hours.   If you develop nausea and vomiting that is not controlled by your nausea medication, call the clinic. If it is after clinic hours your family physician or the after hours number for the clinic or go to the Emergency Department.   BELOW ARE SYMPTOMS THAT SHOULD BE REPORTED IMMEDIATELY:  *FEVER GREATER THAN 100.5 F  *CHILLS WITH OR WITHOUT FEVER  NAUSEA AND VOMITING THAT IS NOT CONTROLLED WITH YOUR NAUSEA MEDICATION  *UNUSUAL SHORTNESS OF BREATH  *UNUSUAL BRUISING OR BLEEDING  TENDERNESS IN MOUTH AND THROAT WITH OR WITHOUT PRESENCE OF ULCERS  *URINARY PROBLEMS  *BOWEL PROBLEMS  UNUSUAL RASH Items with * indicate a potential emergency and should be followed up as soon as possible.  Feel free to call the clinic you have any questions or concerns. The clinic phone number is [phone].   I have been informed and understand all the instructions given to me. I know to contact the clinic, my physician, or go to the Emergency Department if any problems should occur. I do not have any questions at this time, but understand that I may call the clinic during office hours   should I have any questions or need assistance in obtaining follow up care.

## 2012-11-13 ENCOUNTER — Other Ambulatory Visit: Payer: Self-pay | Admitting: Certified Registered Nurse Anesthetist

## 2012-11-13 ENCOUNTER — Ambulatory Visit (HOSPITAL_BASED_OUTPATIENT_CLINIC_OR_DEPARTMENT_OTHER): Payer: Medicare Other

## 2012-11-13 VITALS — BP 128/48 | HR 58 | Temp 98.0°F | Resp 20

## 2012-11-13 MED ORDER — PEGFILGRASTIM INJECTION 6 MG/0.6ML
6.0000 mg | Freq: Once | SUBCUTANEOUS | Status: AC
  Administered 2012-11-13: 6 mg via SUBCUTANEOUS
  Filled 2012-11-13: qty 0.6

## 2012-11-14 ENCOUNTER — Ambulatory Visit: Payer: Medicare Other

## 2012-11-16 ENCOUNTER — Ambulatory Visit
Admission: RE | Admit: 2012-11-16 | Discharge: 2012-11-16 | Disposition: A | Discharge status: Home / Self Care | Payer: Medicare Other | Source: Ambulatory Visit | Attending: Radiation Oncology | Admitting: Radiation Oncology

## 2012-11-16 ENCOUNTER — Telehealth: Payer: Self-pay | Admitting: Radiation Oncology

## 2012-11-16 ENCOUNTER — Encounter: Payer: Self-pay | Admitting: Radiation Oncology

## 2012-11-16 VITALS — BP 118/69 | HR 66 | Temp 97.2°F | Resp 18 | Wt 194.0 lb

## 2012-11-16 DIAGNOSIS — C3490 Malignant neoplasm of unspecified part of unspecified bronchus or lung: Secondary | ICD-10-CM

## 2012-11-16 MED ORDER — HYDROCODONE-ACETAMINOPHEN 7.5-325 MG PO TABS: 1.0000 | ORAL_TABLET | Freq: Four times a day (QID) | ORAL | Status: DC | PRN

## 2012-11-16 NOTE — Progress Notes (Signed)
Patient presents to the clinic today accompanied by his family for follow up with Dr. Roselind Messier. Patient alert and oriented to person, place, and time. No distress noted. Patient being pushed in wheelchair due to generalized weakness. Patient reports using walker at home. Pleasant affect noted. Patient denies pain at this time. Patient reports a dry persistent cough. Patient reports dry mouth. Patient reports that he has increased his oral intake of solid foods such as popcorn and trail mix. Patient reports that he is no longer cold or hot sensitive. Patient reports on average he can get down one can of boost per day. Patient reports taking compazine each night. Patient denies nausea or vomiting. Patient denies hot sweats. Weight stable. Patient denies headache. Patient reports occasional dizziness when rising from a sitting to standing position. Patient reports the skin of the treatment field has returned to normal color. Patient reports fatigue. Reported all findings to Dr. Roselind Messier.

## 2012-11-16 NOTE — Telephone Encounter (Signed)
Opened in error

## 2012-11-16 NOTE — Progress Notes (Signed)
Radiation Oncology         [phone] ________________________________  Name: Shawn [name] MRN: [id number]  Date: 11/16/2012  DOB: 1928-08-20  Follow-Up Visit Note  CC: Minda Meo, MD  Kalman Shan, MD  Diagnosis:   Small cell lung cancer  Interval Since Last Radiation:  1  months  Narrative:  The patient returns today for routine follow-up.  He continues to be quite fatigued at this time. He continues to have  discomfort with swallowing but is able to take in soft foods at this time. I did refill the patient's hydrocodone. Patient was admitted to the hospital approximately a week after completion of his chest radiation therapy. His platelet count was low along with other issues. He denies any breathing problems or significant cough.   He is accompanied by his 2 sons on evaluation today.                        ALLERGIES:  has No Known Allergies.  Meds: Current Outpatient Prescriptions  Medication Sig Dispense Refill  . feeding supplement (RESOURCE BREEZE) LIQD Take 1 Container by mouth 3 (three) times daily between meals.      . furosemide (LASIX) 20 MG tablet Take 20 mg by mouth every morning.      Marland Kitchen HYDROcodone-acetaminophen (NORCO) 7.5-325 MG per tablet Take 1 tablet by mouth every 6 (six) hours as needed. For pain  30 tablet  0  . lactose free nutrition (BOOST PLUS) LIQD Take 237 mLs by mouth 3 (three) times daily with meals.      . lansoprazole (PREVACID) 15 MG capsule Take 1 capsule (15 mg total) by mouth 2 (two) times daily.  30 capsule  0  . levothyroxine (SYNTHROID, LEVOTHROID) 75 MCG tablet Take 75 mcg by mouth every morning.       . lidocaine-prilocaine (EMLA) cream Apply 1 application topically as needed. Apply to port 1 hour before chemotherapy, cover with plastic dressing.      . Linaclotide (LINZESS) 145 MCG CAPS Take 145 mcg by mouth as needed.       Marland Kitchen lisinopril (PRINIVIL,ZESTRIL) 10 MG tablet Take 10 mg by mouth every morning.      . metoprolol tartrate  (LOPRESSOR) 12.5 mg TABS Take 0.5 tablets (12.5 mg total) by mouth 2 (two) times daily.  60 tablet  0  . Multiple Vitamin (MULTIVITAMIN) tablet Take 1 tablet by mouth every morning.       . nitroGLYCERIN (NITROSTAT) 0.4 MG SL tablet Place 0.4 mg under the tongue every 5 (five) minutes as needed. For chest pain      . potassium chloride (K-DUR,KLOR-CON) 10 MEQ tablet Take 20 mEq by mouth every morning.       . prochlorperazine (COMPAZINE) 10 MG tablet Take 10 mg by mouth at bedtime.      . rosuvastatin (CRESTOR) 10 MG tablet Take 10 mg by mouth every morning.      . sucralfate (CARAFATE) 1 GM/10ML suspension Take 1 g by mouth 4 (four) times daily. Refill called into [facility] [address] To Tresa Endo [facility] [phone] directions unchanged with one refill      . GLIPIZIDE XL 10 MG 24 hr tablet       . levofloxacin (LEVAQUIN) 500 MG tablet Take 1 tablet (500 mg total) by mouth daily.  4 tablet  0  . [DISCONTINUED] rivastigmine (EXELON) 4.6 mg/24hr Place 1 patch onto the skin daily.       No  current facility-administered medications for this encounter.    Physical Findings: The patient is in no acute distress. Patient is alert and oriented.  weight is 194 lb (87.998 kg). His oral temperature is 97.2 F (36.2 C). His blood pressure is 118/69 and his pulse is 66. His respiration is 18 and oxygen saturation is 86%. .  No palpable cervical or supraclavicular or axillary adenopathy. The lungs are clear to auscultation. The heart has regular rhythm and rate.  Lab Findings: Lab Results  Component Value Date   WBC 4.1 11/10/2012   HGB 8.2* 11/10/2012   HCT 23.4* 11/10/2012   MCV 89.5 11/10/2012   PLT 171 11/10/2012    @LASTCHEM @  Radiographic Findings: Dg Abd 1 View  10/28/2012  *RADIOLOGY REPORT*  Clinical Data: Bowel localization for G tube placement  ABDOMEN - 1 VIEW  Comparison: 10/27/2012  Findings: Supine abdomen shows contrast material within the lumen of a collapsed colon.  There is some mild  diverticular change in the descending colon with more prominent diverticulosis in the sigmoid colon.  IMPRESSION: Contrast material opacifies the colonic lumen.   Original Report Authenticated By: Kennith Center, M.D.    Dg Chest Port 1 View  10/22/2012  *RADIOLOGY REPORT*  Clinical Data: Weakness, shortness of breath.  PORTABLE CHEST - 1 VIEW  Comparison: 08/04/2012  Findings: The prior CABG.  Mild hyperinflation/COPD.  Right Port-A- Cath is in place with the tip in the lower SVC.  Calcified granuloma in the right upper lobe.  Previously seen left suprahilar density not well appreciated by plain film.  No effusions.  No acute bony abnormality.  IMPRESSION: Hyperinflation/COPD.  Left suprahilar nodular density seen on prior CT not well appreciated on plain film.   Original Report Authenticated By: Charlett Nose, M.D.    Dg Abd Portable 1v  10/27/2012  *RADIOLOGY REPORT*  Clinical Data: Evaluation of amount of barium within the intestines prior to possible gastrostomy tube placement.  PORTABLE ABDOMEN - 1 VIEW  Comparison: None.  Findings: Residual contrast is seen within the small intestine and large intestine.  There is some residual contrast in the left upper quadrant.  Surgical clips are seen in the abdomen and pelvis. There is slight scoliosis convexity to the left.  Changes of degenerative spondylosis are seen.  IMPRESSION: Residual contrast is seen within the large and small intestine in the abdomen or pelvis.  There is some residual contrast in the left upper quadrant.   Original Report Authenticated By: Onalee Hua Call     Impression:  The patient is recovering from the effects of radiation.    Plan:  Routine followup in 3 months. Patient will undergo CT scan of the body on March 6 and followup with Dr. Arbutus Ped on March 11.  _____________________________________    Billie Lade, PhD, MD

## 2012-11-20 ENCOUNTER — Encounter: Payer: Self-pay | Admitting: *Deleted

## 2012-11-20 NOTE — Progress Notes (Signed)
[facility] Clinical Social Work  Clinical Social Work received call from patient's son, Shawn [name], concerned that his father is not doing well at home. He states his father continues to feel weak and fatigued and has difficulty managing at home.  Patient's son is concerned patient is depressed.  CSW is familiar with patient and has provided brief counseling in CSW office in past.  CSW and patient's son discussed ways to help patient achieve some independence.  They are currently looking for a one story home for patient to reside in and discussed organizing social activities with family/friends. Patient's son concerned and requested CSW meet individually with patient after next CT scan appointment. CSW agreed and patient's son will be in contact with CSW closer to next appointment.  Kathrin Penner, MSW, LCSW Clinical Social Worker [facility] [phone]

## 2012-11-23 ENCOUNTER — Other Ambulatory Visit: Payer: Self-pay

## 2012-11-23 ENCOUNTER — Inpatient Hospital Stay (HOSPITAL_COMMUNITY)
Admission: EM | Admit: 2012-11-23 | Discharge: 2012-12-22 | DRG: 812 | Disposition: E | Payer: Medicare Other | Attending: Internal Medicine | Admitting: Internal Medicine

## 2012-11-23 ENCOUNTER — Emergency Department (HOSPITAL_COMMUNITY): Payer: Medicare Other

## 2012-11-23 ENCOUNTER — Encounter (HOSPITAL_COMMUNITY): Payer: Self-pay | Admitting: *Deleted

## 2012-11-23 DIAGNOSIS — I4891 Unspecified atrial fibrillation: Secondary | ICD-10-CM | POA: Diagnosis present

## 2012-11-23 DIAGNOSIS — C3490 Malignant neoplasm of unspecified part of unspecified bronchus or lung: Secondary | ICD-10-CM | POA: Diagnosis present

## 2012-11-23 DIAGNOSIS — K56600 Partial intestinal obstruction, unspecified as to cause: Secondary | ICD-10-CM | POA: Diagnosis present

## 2012-11-23 DIAGNOSIS — Z8249 Family history of ischemic heart disease and other diseases of the circulatory system: Secondary | ICD-10-CM

## 2012-11-23 DIAGNOSIS — F3289 Other specified depressive episodes: Secondary | ICD-10-CM | POA: Diagnosis present

## 2012-11-23 DIAGNOSIS — D6959 Other secondary thrombocytopenia: Secondary | ICD-10-CM | POA: Diagnosis present

## 2012-11-23 DIAGNOSIS — R531 Weakness: Secondary | ICD-10-CM | POA: Diagnosis present

## 2012-11-23 DIAGNOSIS — R112 Nausea with vomiting, unspecified: Secondary | ICD-10-CM | POA: Diagnosis present

## 2012-11-23 DIAGNOSIS — K56609 Unspecified intestinal obstruction, unspecified as to partial versus complete obstruction: Secondary | ICD-10-CM | POA: Diagnosis present

## 2012-11-23 DIAGNOSIS — Z951 Presence of aortocoronary bypass graft: Secondary | ICD-10-CM

## 2012-11-23 DIAGNOSIS — D72829 Elevated white blood cell count, unspecified: Secondary | ICD-10-CM | POA: Diagnosis present

## 2012-11-23 DIAGNOSIS — Z79899 Other long term (current) drug therapy: Secondary | ICD-10-CM

## 2012-11-23 DIAGNOSIS — E119 Type 2 diabetes mellitus without complications: Secondary | ICD-10-CM | POA: Diagnosis present

## 2012-11-23 DIAGNOSIS — E7800 Pure hypercholesterolemia, unspecified: Secondary | ICD-10-CM | POA: Diagnosis present

## 2012-11-23 DIAGNOSIS — I129 Hypertensive chronic kidney disease with stage 1 through stage 4 chronic kidney disease, or unspecified chronic kidney disease: Secondary | ICD-10-CM | POA: Diagnosis present

## 2012-11-23 DIAGNOSIS — D6481 Anemia due to antineoplastic chemotherapy: Principal | ICD-10-CM | POA: Diagnosis present

## 2012-11-23 DIAGNOSIS — I252 Old myocardial infarction: Secondary | ICD-10-CM

## 2012-11-23 DIAGNOSIS — Z9861 Coronary angioplasty status: Secondary | ICD-10-CM

## 2012-11-23 DIAGNOSIS — E039 Hypothyroidism, unspecified: Secondary | ICD-10-CM | POA: Diagnosis present

## 2012-11-23 DIAGNOSIS — Z66 Do not resuscitate: Secondary | ICD-10-CM | POA: Diagnosis present

## 2012-11-23 DIAGNOSIS — E8809 Other disorders of plasma-protein metabolism, not elsewhere classified: Secondary | ICD-10-CM | POA: Diagnosis present

## 2012-11-23 DIAGNOSIS — I2510 Atherosclerotic heart disease of native coronary artery without angina pectoris: Secondary | ICD-10-CM | POA: Diagnosis present

## 2012-11-23 DIAGNOSIS — Z8546 Personal history of malignant neoplasm of prostate: Secondary | ICD-10-CM

## 2012-11-23 DIAGNOSIS — R5381 Other malaise: Secondary | ICD-10-CM

## 2012-11-23 DIAGNOSIS — N183 Chronic kidney disease, stage 3 unspecified: Secondary | ICD-10-CM | POA: Diagnosis present

## 2012-11-23 DIAGNOSIS — D696 Thrombocytopenia, unspecified: Secondary | ICD-10-CM

## 2012-11-23 DIAGNOSIS — K219 Gastro-esophageal reflux disease without esophagitis: Secondary | ICD-10-CM | POA: Diagnosis present

## 2012-11-23 DIAGNOSIS — M129 Arthropathy, unspecified: Secondary | ICD-10-CM | POA: Diagnosis present

## 2012-11-23 DIAGNOSIS — I509 Heart failure, unspecified: Secondary | ICD-10-CM | POA: Diagnosis present

## 2012-11-23 DIAGNOSIS — T451X5A Adverse effect of antineoplastic and immunosuppressive drugs, initial encounter: Secondary | ICD-10-CM | POA: Diagnosis present

## 2012-11-23 DIAGNOSIS — R4182 Altered mental status, unspecified: Secondary | ICD-10-CM | POA: Diagnosis present

## 2012-11-23 DIAGNOSIS — Z87891 Personal history of nicotine dependence: Secondary | ICD-10-CM

## 2012-11-23 DIAGNOSIS — D649 Anemia, unspecified: Secondary | ICD-10-CM

## 2012-11-23 DIAGNOSIS — E860 Dehydration: Secondary | ICD-10-CM | POA: Diagnosis present

## 2012-11-23 LAB — COMPREHENSIVE METABOLIC PANEL
Albumin: 2.8 g/dL — ABNORMAL LOW (ref 3.5–5.2)
Alkaline Phosphatase: 149 U/L — ABNORMAL HIGH (ref 39–117)
BUN: 24 mg/dL — ABNORMAL HIGH (ref 6–23)
CO2: 19 mEq/L (ref 19–32)
Chloride: 103 mEq/L (ref 96–112)
Potassium: 3.8 mEq/L (ref 3.5–5.1)
Total Bilirubin: 0.6 mg/dL (ref 0.3–1.2)

## 2012-11-23 LAB — CBC WITH DIFFERENTIAL/PLATELET
Basophils Relative: 0 % (ref 0–1)
Eosinophils Relative: 0 % (ref 0–5)
HCT: 19.5 % — ABNORMAL LOW (ref 39.0–52.0)
Hemoglobin: 6.7 g/dL — CL (ref 13.0–17.0)
Lymphs Abs: 1 10*3/uL (ref 0.7–4.0)
MCV: 92.4 fL (ref 78.0–100.0)
Monocytes Relative: 8 % (ref 3–12)
Neutro Abs: 16.7 10*3/uL — ABNORMAL HIGH (ref 1.7–7.7)
Platelets: 13 10*3/uL — CL (ref 150–400)
RBC: 2.11 MIL/uL — ABNORMAL LOW (ref 4.22–5.81)
WBC: 19.2 10*3/uL — ABNORMAL HIGH (ref 4.0–10.5)

## 2012-11-23 LAB — LIPASE, BLOOD: Lipase: 17 U/L (ref 11–59)

## 2012-11-23 MED ORDER — FENTANYL CITRATE 0.05 MG/ML IJ SOLN
50.0000 ug | Freq: Once | INTRAMUSCULAR | Status: AC
  Administered 2012-11-23: 50 ug via INTRAVENOUS
  Filled 2012-11-23: qty 2

## 2012-11-23 MED ORDER — SODIUM CHLORIDE 0.9 % IV BOLUS (SEPSIS): 500.0000 mL | Freq: Once | INTRAVENOUS | Status: DC

## 2012-11-23 MED ORDER — MORPHINE SULFATE 2 MG/ML IJ SOLN: 1.0000 mg | INTRAMUSCULAR | Status: DC | PRN

## 2012-11-23 MED ORDER — POTASSIUM CHLORIDE IN NACL 20-0.9 MEQ/L-% IV SOLN
INTRAVENOUS | Status: DC
  Filled 2012-11-23: qty 1000

## 2012-11-23 MED ORDER — ONDANSETRON HCL 4 MG/2ML IJ SOLN: 4.0000 mg | Freq: Four times a day (QID) | INTRAMUSCULAR | Status: DC | PRN

## 2012-11-23 MED ORDER — ATORVASTATIN CALCIUM 20 MG PO TABS
20.0000 mg | ORAL_TABLET | Freq: Every day | ORAL | Status: DC
  Filled 2012-11-23: qty 1

## 2012-11-23 MED ORDER — ACETAMINOPHEN 325 MG PO TABS: 650.0000 mg | ORAL_TABLET | Freq: Four times a day (QID) | ORAL | Status: DC | PRN

## 2012-11-23 MED ORDER — ONDANSETRON HCL 4 MG PO TABS: 4.0000 mg | ORAL_TABLET | Freq: Four times a day (QID) | ORAL | Status: DC | PRN

## 2012-11-23 MED ORDER — PROCHLORPERAZINE MALEATE 10 MG PO TABS
10.0000 mg | ORAL_TABLET | Freq: Every day | ORAL | Status: DC
  Filled 2012-11-23: qty 1

## 2012-11-23 MED ORDER — LEVOTHYROXINE SODIUM 75 MCG PO TABS
75.0000 ug | ORAL_TABLET | Freq: Every day | ORAL | Status: DC
  Filled 2012-11-23 (×2): qty 1

## 2012-11-23 MED ORDER — ONDANSETRON HCL 4 MG/2ML IJ SOLN
INTRAMUSCULAR | Status: AC
  Filled 2012-11-23: qty 2

## 2012-11-23 MED ORDER — ONDANSETRON HCL 4 MG/2ML IJ SOLN
4.0000 mg | Freq: Once | INTRAMUSCULAR | Status: AC
  Administered 2012-11-23: 4 mg via INTRAVENOUS

## 2012-11-23 MED ORDER — ACETAMINOPHEN 650 MG RE SUPP: 650.0000 mg | Freq: Four times a day (QID) | RECTAL | Status: DC | PRN

## 2012-11-23 MED ORDER — BOOST PLUS PO LIQD
237.0000 mL | Freq: Three times a day (TID) | ORAL | Status: DC
  Filled 2012-11-23 (×6): qty 237

## 2012-11-23 MED ORDER — PANTOPRAZOLE SODIUM 40 MG PO TBEC: 40.0000 mg | DELAYED_RELEASE_TABLET | Freq: Two times a day (BID) | ORAL | Status: DC

## 2012-11-23 MED ORDER — METOPROLOL TARTRATE 12.5 MG HALF TABLET
12.5000 mg | ORAL_TABLET | Freq: Two times a day (BID) | ORAL | Status: DC
  Filled 2012-11-23 (×2): qty 1

## 2012-11-23 MED ORDER — SODIUM CHLORIDE 0.9 % IV SOLN: INTRAVENOUS | Status: DC

## 2012-11-23 MED ORDER — HYDROCODONE-ACETAMINOPHEN 5-325 MG PO TABS
1.0000 | ORAL_TABLET | Freq: Four times a day (QID) | ORAL | Status: DC | PRN
  Administered 2012-11-23: 1 via ORAL
  Filled 2012-11-23: qty 1

## 2012-11-23 MED ORDER — FENTANYL CITRATE 0.05 MG/ML IJ SOLN
50.0000 ug | INTRAMUSCULAR | Status: DC | PRN
  Administered 2012-11-23: 50 ug via INTRAVENOUS
  Filled 2012-11-23: qty 2

## 2012-11-23 MED ORDER — SODIUM CHLORIDE 0.9 % IV BOLUS (SEPSIS)
700.0000 mL | Freq: Once | INTRAVENOUS | Status: AC
  Administered 2012-11-23: 700 mL via INTRAVENOUS

## 2012-11-23 MED ORDER — ONDANSETRON HCL 4 MG/2ML IJ SOLN
4.0000 mg | Freq: Once | INTRAMUSCULAR | Status: AC
  Administered 2012-11-23: 4 mg via INTRAVENOUS
  Filled 2012-11-23: qty 2

## 2012-11-23 MED ORDER — SODIUM CHLORIDE 0.9 % IJ SOLN: 3.0000 mL | Freq: Two times a day (BID) | INTRAMUSCULAR | Status: DC

## 2012-11-24 LAB — TYPE AND SCREEN
ABO/RH(D): A POS
Antibody Screen: NEGATIVE
Unit division: 0

## 2012-11-25 ENCOUNTER — Ambulatory Visit: Payer: Medicare Other | Admitting: Cardiology

## 2012-11-26 ENCOUNTER — Ambulatory Visit (HOSPITAL_COMMUNITY): Payer: Medicare Other

## 2012-11-29 LAB — CULTURE, BLOOD (ROUTINE X 2): Culture: NO GROWTH

## 2012-12-01 ENCOUNTER — Ambulatory Visit: Payer: Medicare Other | Admitting: Internal Medicine

## 2012-12-01 ENCOUNTER — Other Ambulatory Visit: Payer: Medicare Other | Admitting: Lab

## 2012-12-22 NOTE — ED Notes (Signed)
Dr. Lynelle Doctor notified that pt's platelet count is 13.  Pt is lung cancer pt and receiving chemotherapy.

## 2012-12-22 NOTE — Discharge Summary (Signed)
  Death Summary  Shawn [name] YQM:[id number] DOB: [birth year] DOA: 2012-12-12  PCP: Minda Meo, MD PCP/Office notified:   Admit date: Dec 12, 2012 Date of Death: 12-Dec-2012  Final Diagnoses:  Active Problems:   DIABETES MELLITUS, TYPE II   Atrial fibrillation   Hypothyroid   Poorly differentiated lung carcinoma, favoring extensive stage small cell lung cancer - Nov 2013, ECOG 1   Chemotherapy induced thrombocytopenia   Anemia associated with chemotherapy   Leukocytosis   Partial small bowel obstruction   Generalized weakness   Dehydration   Hypoalbuminemia   History of present illness:  79 y.o. male with a history of lung cancer who presented to the emergency room with generalized weakness. His family reported that his latest chemotherapy treatment was approximately 10 days prior to this admission. He had a similar presentation after his previous treatment. He was experiencing increasing generalized weakness, decreased by mouth intake, nausea with occasional vomiting, confusion. He did not have any fever, he was having regular bowel movements, he did not complain of any difficulty with his urination, he had a dry cough. He was evaluated in the emergency room and noted to be anemic at 6.7 as well as thrombocytopenic with platelet count of 13.  He was also clinically dehydrated.  In the middle of blood transfusion, patient went into asystole. Patient was pronounced dead 2012/12/12 at 9:20.   Time: 9:20 am 12/12/12  Signed:  Manson Passey  [facility] 12/12/12, 10:02 AM

## 2012-12-22 NOTE — Progress Notes (Signed)
Pt expired immediately after blood transfusion therefore final vitals were not taken.

## 2012-12-22 NOTE — ED Notes (Signed)
Pt still in x-ray

## 2012-12-22 NOTE — ED Notes (Signed)
Here by EMS, here from home, here with family, pt has lung CA, last chemo this am, son reported decreased loc/ AMS onset around 1700 Sunday night, generally weak poor color (pale), also c/o abd pain and nv. abd distended & firm. Emesis with EMS. No obvious blood. IV started by EMS and zofran given PTA. EMS reports NSR with multifocal PVCs. VSS.

## 2012-12-22 NOTE — ED Provider Notes (Signed)
History     CSN: [id number]  Arrival date & time 12-04-12  0115   First MD Initiated Contact with Patient 04-Dec-2012 0216      Chief Complaint  Patient presents with  . Altered Mental Status  . Fatigue  . Nausea  . Emesis   Level V caveat for altered mental status  (Consider location/radiation/quality/duration/timing/severity/associated sxs/prior treatment) HPI  Patient here with his family. He reports patient is getting chemotherapy, his last chemotherapy was approximately 10 days ago. He reports shortly after that he started feeling weak and having loss of appetite. They report today he had abdominal cramping and was shaking and was weaker than usual. He has had nausea and vomiting of mainly bile-colored fluid 2 or 3 times today. They deny diarrhea. They note he has been pale. He also spent having swelling of his ankles without shortness of breath. Patient states he has chest pain "occasionally" but really can't give me any details. Family reports he was admitted to the hospital one month ago for neutropenic fever complicated by an episode of atrial fibrillation.  Review of chart shows patient has small cell carcinoma of the lung with possible metastases to the brain.  PCP Dr Jacky Kindle Cardiology Dr Riley Kill Oncologist Dr. Shirline Frees  Past Medical History  Diagnosis Date  . CAD (coronary artery disease)     a. 1990 CABGx3: LIMA->LAD, VG->OM, VG->dRCA;  b. 05/2007 VF Arrest/Cath/PCI: 100 VG->OM tx w/ BMS, 95 VG->RCA tx w/ bms;  c. 06/2007 90 Native PDA tx w DES ;  d. 05/2009 Echo EF 60-65%, nl WM, mild MR; e. normal lexiscan myoview 03/30/12  . HTN (hypertension)   . Hypercholesteremia   . Atrial fibrillation     a. had been on tikosyn -> d/c 09/2010;  b. Was on Amio ->d/c 09/2011;  c.  anticoagulated w/ Pradaxa  . Junctional ectopic tachycardia     a. noted 09/2010  . Sinus bradycardia   . Back pain     a. Followed by [facility] - on prednisone  . Prostate cancer   . Unsteady gait     a. in  rehab for core strengthening  . Colitis   . Tubular adenoma of colon 02/2007  . Myocardial infarction   . Arthritis   . CHF (congestive heart failure)   . Depression   . Internal hemorrhoids without mention of complication   . Diverticulosis of colon (without mention of hemorrhage)   . Diabetes mellitus     Type 2 NIDDM x 15 years  . CKD (chronic kidney disease), stage III     pt denies CKD  . GERD (gastroesophageal reflux disease)     Past Surgical History  Procedure Laterality Date  . Coronary artery bypass graft  1990  . Prostate surgery  01/1992    retropubic prostatectomy  . Cardiac valve surgery    . Coronary angioplasty with stent placement  2008    x 3   . Rt finger trigger a-1 pulley    . Cholecystectomy  05/05/12  . Cholecystectomy  05/05/2012    Procedure: LAPAROSCOPIC CHOLECYSTECTOMY WITH INTRAOPERATIVE CHOLANGIOGRAM;  Surgeon: Mariella Saa, MD;  Location: [facility] OR;  Service: General;  Laterality: N/A;  . Endobronchial ultrasound  08/04/2012    Procedure: ENDOBRONCHIAL ULTRASOUND;  Surgeon: Kalman Shan, MD;  Location: [facility] OR;  Service: Cardiopulmonary;  Laterality: N/A;  . Video bronchoscopy  08/04/2012    Procedure: VIDEO BRONCHOSCOPY WITH FLUORO;  Surgeon: Kalman Shan, MD;  Location: [facility] OR;  Service:  Cardiopulmonary;  Laterality: N/A;    Family History  Problem Relation Age of Onset  . Other Father     died of unknown cause @ young age  . Coronary artery disease Mother     died in late 73's w/ "hardening of the arteries"  . Heart disease Mother   . Heart disease Son   . Diabetes Son   . Colon cancer Neg Hx     History  Substance Use Topics  . Smoking status: Former Smoker -- 40 years    Types: Cigarettes    Quit date: 09/23/1988  . Smokeless tobacco: Never Used     Comment: quit 20+ years ago  . Alcohol Use: No   Lives at home Lives with son   Review of Systems  Unable to perform ROS: Mental status change    Allergies  Review of  patient's allergies indicates no known allergies.  Home Medications   Current Outpatient Rx  Name  Route  Sig  Dispense  Refill  . acetaminophen (TYLENOL) 500 MG tablet   Oral   Take 500-1,000 mg by mouth See admin instructions. Takes 2 tablets in the morning and 1 tablet at bedtime with Vicodin.         . furosemide (LASIX) 20 MG tablet   Oral   Take 20 mg by mouth every morning.         Marland Kitchen HYDROcodone-acetaminophen (NORCO) 7.5-325 MG per tablet   Oral   Take 0.5 tablets by mouth at bedtime. For pain         . lactose free nutrition (BOOST PLUS) LIQD   Oral   Take 237 mLs by mouth 3 (three) times daily with meals.         . lansoprazole (PREVACID) 15 MG capsule   Oral   Take 1 capsule (15 mg total) by mouth 2 (two) times daily.   30 capsule   0   . levothyroxine (SYNTHROID, LEVOTHROID) 75 MCG tablet   Oral   Take 75 mcg by mouth every morning.          Marland Kitchen lisinopril (PRINIVIL,ZESTRIL) 10 MG tablet   Oral   Take 10 mg by mouth every morning.         . metoprolol tartrate (LOPRESSOR) 12.5 mg TABS   Oral   Take 0.5 tablets (12.5 mg total) by mouth 2 (two) times daily.   60 tablet   0   . Multiple Vitamin (MULTIVITAMIN) tablet   Oral   Take 1 tablet by mouth every morning.          . potassium chloride (K-DUR,KLOR-CON) 10 MEQ tablet   Oral   Take 20 mEq by mouth daily.          . prochlorperazine (COMPAZINE) 10 MG tablet   Oral   Take 10 mg by mouth at bedtime.         . rosuvastatin (CRESTOR) 10 MG tablet   Oral   Take 10 mg by mouth every morning.         . nitroGLYCERIN (NITROSTAT) 0.4 MG SL tablet   Sublingual   Place 0.4 mg under the tongue every 5 (five) minutes as needed. For chest pain           BP 131/47  Pulse 86  Temp(Src) 97.8 F (36.6 C) (Axillary)  Resp 19  SpO2 95%  Vital signs normal    Physical Exam  Nursing note and vitals reviewed. Constitutional: He is oriented to  person, place, and time. He appears  well-developed and well-nourished.  Non-toxic appearance. He does not appear ill. No distress.  HENT:  Head: Normocephalic and atraumatic.  Right Ear: External ear normal.  Left Ear: External ear normal.  Nose: Nose normal. No mucosal edema or rhinorrhea.  Mouth/Throat: Oropharynx is clear and moist and mucous membranes are normal. No dental abscesses or edematous.  Eyes: Conjunctivae and EOM are normal. Pupils are equal, round, and reactive to light.  Sclera pale  Neck: Normal range of motion and full passive range of motion without pain. Neck supple.  Cardiovascular: Normal rate, regular rhythm and normal heart sounds.  Exam reveals no gallop and no friction rub.   No murmur heard. Rare ectopy heard, monitor shows normal sinus rhythm with rare PAC or PVC  Pulmonary/Chest: Effort normal and breath sounds normal. No respiratory distress. He has no wheezes. He has no rhonchi. He has no rales. He exhibits no tenderness and no crepitus.  Abdominal: Soft. Normal appearance and bowel sounds are normal. He exhibits no distension. There is no tenderness. There is no rebound and no guarding.  Musculoskeletal: Normal range of motion. He exhibits edema. He exhibits no tenderness.  Patient has 2+ pitting edema up to his knees bilaterally  Neurological: He is alert and oriented to person, place, and time. He has normal strength. No cranial nerve deficit.  Skin: Skin is warm, dry and intact. No rash noted. No erythema. There is pallor.  Psychiatric: He has a normal mood and affect. His speech is normal and behavior is normal. His mood appears not anxious.    ED Course  Procedures (including critical care time) Medications  fentaNYL (SUBLIMAZE) injection 50 mcg (50 mcg Intravenous Given 12-06-2012 0513)  sodium chloride 0.9 % bolus 500 mL (not administered)  fentaNYL (SUBLIMAZE) injection 50 mcg (50 mcg Intravenous Given 2012/12/06 0148)  ondansetron (ZOFRAN) injection 4 mg (4 mg Intravenous Given 2012/12/06 0147)   sodium chloride 0.9 % bolus 700 mL (700 mLs Intravenous New Bag/Given Dec 06, 2012 0418)  ondansetron (ZOFRAN) injection 4 mg (4 mg Intravenous Given 2012-12-06 0513)   Review of last admission shows he was admitted by [facility]  Family states pt c/o back pain which he has had for 15 years, when asked what he normally takes for pain they state he normally walks around.  Family upset because blood cultures were being drawn after reviewing his lab results, they felt he had already had blood work and didn't need any more.   Pt typed and crossed for 2 units of PRC's.  05:12 Dr Kerry Hough, admit to tele (had episode of a fib last admission under similar circumstances), team 10  Results for orders placed during the hospital encounter of 2012-12-06  CBC WITH DIFFERENTIAL      Result Value Range   WBC 19.2 (*) 4.0 - 10.5 K/uL   RBC 2.11 (*) 4.22 - 5.81 MIL/uL   Hemoglobin 6.7 (*) 13.0 - 17.0 g/dL   HCT 16.1 (*) 09.6 - 04.5 %   MCV 92.4  78.0 - 100.0 fL   MCH 31.8  26.0 - 34.0 pg   MCHC 34.4  30.0 - 36.0 g/dL   RDW 40.9 (*) 81.1 - 91.4 %   Platelets 13 (*) 150 - 400 K/uL   Neutrophils Relative 87 (*) 43 - 77 %   Lymphocytes Relative 5 (*) 12 - 46 %   Monocytes Relative 8  3 - 12 %   Eosinophils Relative 0  0 - 5 %  Basophils Relative 0  0 - 1 %   Neutro Abs 16.7 (*) 1.7 - 7.7 K/uL   Lymphs Abs 1.0  0.7 - 4.0 K/uL   Monocytes Absolute 1.5 (*) 0.1 - 1.0 K/uL   Eosinophils Absolute 0.0  0.0 - 0.7 K/uL   Basophils Absolute 0.0  0.0 - 0.1 K/uL   RBC Morphology TEARDROP CELLS     WBC Morphology TOXIC GRANULATION    COMPREHENSIVE METABOLIC PANEL      Result Value Range   Sodium 136  135 - 145 mEq/L   Potassium 3.8  3.5 - 5.1 mEq/L   Chloride 103  96 - 112 mEq/L   CO2 19  19 - 32 mEq/L   Glucose, Bld 282 (*) 70 - 99 mg/dL   BUN 24 (*) 6 - 23 mg/dL   Creatinine, Ser 4.09  0.50 - 1.35 mg/dL   Calcium 8.1 (*) 8.4 - 10.5 mg/dL   Total Protein 6.2  6.0 - 8.3 g/dL   Albumin 2.8 (*) 3.5 - 5.2 g/dL    AST 18  0 - 37 U/L   ALT 12  0 - 53 U/L   Alkaline Phosphatase 149 (*) 39 - 117 U/L   Total Bilirubin 0.6  0.3 - 1.2 mg/dL   GFR calc non Af Amer 52 (*) >90 mL/min   GFR calc Af Amer 61 (*) >90 mL/min  LIPASE, BLOOD      Result Value Range   Lipase 17  11 - 59 U/L  TROPONIN I      Result Value Range   Troponin I <0.30  <0.30 ng/mL  POCT I-STAT TROPONIN I      Result Value Range   Troponin i, poc 0.06  0.00 - 0.08 ng/mL   Comment 3            Laboratory interpretation all normal except leukocytosis, thrombocytopenia, anemia, hyperglycemia   Dg Abd Acute W/chest  11-30-2012  *RADIOLOGY REPORT*  Clinical Data: Altered mental status, fatigue, nausea and vomiting.  ACUTE ABDOMEN SERIES (ABDOMEN 2 VIEW & CHEST 1 VIEW)  Comparison: Abdominal radiograph performed 10/28/2012, and chest radiograph performed 10/22/2012  Findings: The lungs are well-aerated.  Mild left basilar opacity likely reflects atelectasis.  There is no evidence of pleural effusion or pneumothorax.  The cardiomediastinal silhouette is borderline normal in size.  The patient is status post median sternotomy.  A right-sided chest port is noted ending at the distal SVC.  The visualized bowel gas pattern is nonspecific.  There is distension of a few small bowel loops to 4.4 cm in maximal diameter, with associated air-fluid levels on the decubitus view, though air is still seen in the colon.  No free intra-abdominal air is identified on the provided decubitus view.  No acute osseous abnormalities are seen; the sacroiliac joints are unremarkable in appearance.  Clips are noted within the right upper quadrant, reflecting prior cholecystectomy.  Scattered clips are also noted along the pelvic sidewalls.  Diffuse vascular calcifications are seen.  IMPRESSION:  1.  Nonspecific bowel gas pattern.  Distension of a few small bowel loops to 4.4 cm in maximal diameter, with associated air-fluid levels.  However, air is still seen in the colon.   This may reflect partial or low grade small bowel obstruction. 2.  No free intra-abdominal air seen. 3.  Mild left basilar opacity likely reflects atelectasis; lungs otherwise clear. 4.  Diffuse vascular calcifications seen.   Original Report Authenticated By: Tonia Ghent, M.D.  Dg Abd 1 View  10/28/2012   IMPRESSION: Contrast material opacifies the colonic lumen.   Original Report Authenticated By: Kennith Center, M.D.    Dg Abd Portable 1v  10/27/2012    IMPRESSION: Residual contrast is seen within the large and small intestine in the abdomen or pelvis.  There is some residual contrast in the left upper quadrant.   Original Report Authenticated By: Onalee Hua Call      Date: 12/18/2012  Rate:90  Rhythm: normal sinus rhythm and premature ventricular contractions (PVC)  QRS Axis: normal  Intervals: normal  ST/T Wave abnormalities: nonspecific T wave changes  Conduction Disutrbances:none  Narrative Interpretation:   Old EKG Reviewed: none available    1. Leukocytosis   2. Anemia   3. Thrombocytopenia   4. Weakness    Plan admission  Devoria Albe, MD, FACEP   MDM          Ward Givens, MD 2012-12-18 [phone]

## 2012-12-22 NOTE — Progress Notes (Signed)
Pt transferred to [id number]. Family at bedside. Pt BP 88/48 Hr 117-130 O2 Sat dropping tp 70's on [place] 5L. Changed to NR Mask 15L. Pt appears to be in discomfort but unable to tell us.Rapid response called. Dr. Elisabeth Pigeon notified. Orders received to transfer to stepdown when bed available.

## 2012-12-22 NOTE — H&P (Signed)
[facility] History and Physical  [name]:[id number] DOB: 08/09/1928 DOA: 12/18/2012  Referring physician: Dr. Devoria Albe PCP: Minda Meo, MD  Specialists: Cardiologist, Dr. Riley Kill Oncologist: Dr. Arbutus Ped  Chief Complaint: generalized weakness  HPI: Shawn [name] is a 79 y.o. male with a history of lung cancer who presents to the emergency room with generalized weakness. His family reports that his latest chemotherapy treatment was approximately 10 days ago. He had a similar presentation after his previous treatment. He is experiencing increasing generalized weakness, decreased by mouth intake, nausea with occasional vomiting, confusion. He's not had any fever, he is having regular bowel movements, he is passing flatus, he did not complain of any difficulty with his urine, he has a dry cough which is nonproductive. He's not had any worsening shortness of breath. He was evaluated in the emergency room and noted to be anemic at 6.7 as well as thrombocytopenic and 13,000. He was also clinically dehydrated. He has been referred for admission.  Review of Systems: Pertinent positives as per HPI, otherwise negative  Past Medical History  Diagnosis Date  . CAD (coronary artery disease)     a. 1990 CABGx3: LIMA->LAD, VG->OM, VG->dRCA;  b. 05/2007 VF Arrest/Cath/PCI: 100 VG->OM tx w/ BMS, 95 VG->RCA tx w/ bms;  c. 06/2007 90 Native PDA tx w DES ;  d. 05/2009 Echo EF 60-65%, nl WM, mild MR; e. normal lexiscan myoview 03/30/12  . HTN (hypertension)   . Hypercholesteremia   . Atrial fibrillation     a. had been on tikosyn -> d/c 09/2010;  b. Was on Amio ->d/c 09/2011;  c.  anticoagulated w/ Pradaxa  . Junctional ectopic tachycardia     a. noted 09/2010  . Sinus bradycardia   . Back pain     a. Followed by [facility] - on prednisone  . Prostate cancer   . Unsteady gait     a. in rehab for core strengthening  . Colitis   . Tubular adenoma of colon 02/2007  . Myocardial infarction   .  Arthritis   . CHF (congestive heart failure)   . Depression   . Internal hemorrhoids without mention of complication   . Diverticulosis of colon (without mention of hemorrhage)   . Diabetes mellitus     Type 2 NIDDM x 15 years  . CKD (chronic kidney disease), stage III     pt denies CKD  . GERD (gastroesophageal reflux disease)    Past Surgical History  Procedure Laterality Date  . Coronary artery bypass graft  1990  . Prostate surgery  01/1992    retropubic prostatectomy  . Cardiac valve surgery    . Coronary angioplasty with stent placement  2008    x 3   . Rt finger trigger a-1 pulley    . Cholecystectomy  05/05/12  . Cholecystectomy  05/05/2012    Procedure: LAPAROSCOPIC CHOLECYSTECTOMY WITH INTRAOPERATIVE CHOLANGIOGRAM;  Surgeon: Mariella Saa, MD;  Location[facility] OR;  Service: General;  Laterality: N/A;  . Endobronchial ultrasound  08/04/2012    Procedure: ENDOBRONCHIAL ULTRASOUND;  Surgeon: Kalman Shan, MD;  Location: [facility] OR;  Service: Cardiopulmonary;  Laterality: N/A;  . Video bronchoscopy  08/04/2012    Procedure: VIDEO BRONCHOSCOPY WITH FLUORO;  Surgeon: Kalman Shan, MD;  Location: [facility] OR;  Service: Cardiopulmonary;  Laterality: N/A;   Social History:  reports that he quit smoking about 24 years ago. His smoking use included Cigarettes. He smoked 0.00 packs per day for 40 years. He  has never used smokeless tobacco. He reports that he does not drink alcohol or use illicit drugs. Lives at home with his son  No Known Allergies  Family History  Problem Relation Age of Onset  . Other Father     died of unknown cause @ young age  . Coronary artery disease Mother     died in late 57's w/ "hardening of the arteries"  . Heart disease Mother   . Heart disease Son   . Diabetes Son   . Colon cancer Neg Hx     Prior to Admission medications   Medication Sig Start Date End Date Taking? Authorizing Provider  acetaminophen (TYLENOL) 500 MG tablet Take 500-1,000 mg  by mouth See admin instructions. Takes 2 tablets in the morning and 1 tablet at bedtime with Vicodin.   Yes Historical Provider, MD  furosemide (LASIX) 20 MG tablet Take 20 mg by mouth every morning. 10/20/12  Yes Herby Abraham, MD  HYDROcodone-acetaminophen (NORCO) 7.5-325 MG per tablet Take 0.5 tablets by mouth at bedtime. For pain 11/16/12  Yes Billie Lade, MD  lactose free nutrition (BOOST PLUS) LIQD Take 237 mLs by mouth 3 (three) times daily with meals. 10/28/12  Yes Elease Etienne, MD  lansoprazole (PREVACID) 15 MG capsule Take 1 capsule (15 mg total) by mouth 2 (two) times daily. 10/28/12  Yes Elease Etienne, MD  levothyroxine (SYNTHROID, LEVOTHROID) 75 MCG tablet Take 75 mcg by mouth every morning.    Yes Historical Provider, MD  lisinopril (PRINIVIL,ZESTRIL) 10 MG tablet Take 10 mg by mouth every morning. 10/16/12  Yes Herby Abraham, MD  metoprolol tartrate (LOPRESSOR) 12.5 mg TABS Take 0.5 tablets (12.5 mg total) by mouth 2 (two) times daily. 10/28/12  Yes Elease Etienne, MD  Multiple Vitamin (MULTIVITAMIN) tablet Take 1 tablet by mouth every morning.    Yes Historical Provider, MD  potassium chloride (K-DUR,KLOR-CON) 10 MEQ tablet Take 20 mEq by mouth daily.    Yes Historical Provider, MD  prochlorperazine (COMPAZINE) 10 MG tablet Take 10 mg by mouth at bedtime. 10/06/12  Yes Si Gaul, MD  rosuvastatin (CRESTOR) 10 MG tablet Take 10 mg by mouth every morning. 10/16/12  Yes Herby Abraham, MD  nitroGLYCERIN (NITROSTAT) 0.4 MG SL tablet Place 0.4 mg under the tongue every 5 (five) minutes as needed. For chest pain    Historical Provider, MD   Physical Exam: Filed Vitals:   12/04/2012 0230 12-04-12 0300 12-04-12 0330 2012-12-04 0500  BP: 128/65 146/59 127/60 110/53  Pulse: 88 89 94   Temp:      TempSrc:      Resp: 24 21 25    SpO2: 98% 96% 99%      General:  Patient appears very weak, chronically ill, no signs of distress  Eyes: Pupils are equal round react to  light  ENT: Mucous membranes are dry  Neck: Supple  Cardiovascular: S1, S2, regular rate and rhythm  Respiratory: Clear to auscultation bilaterally  Abdomen: Soft, nontender, nondistended, bowel sounds are active  Skin: Deferred  Musculoskeletal: Deferred  Psychiatric: Normal affect, cooperative with exam  Neurologic: Grossly intact, nonfocal  Labs on Admission:  Basic Metabolic Panel:  Recent Labs Lab 2012-12-04 0306  NA 136  K 3.8  CL 103  CO2 19  GLUCOSE 282*  BUN 24*  CREATININE 1.24  CALCIUM 8.1*   Liver Function Tests:  Recent Labs Lab 2012/12/04 0306  AST 18  ALT 12  ALKPHOS 149*  BILITOT  0.6  PROT 6.2  ALBUMIN 2.8*    Recent Labs Lab 2012-12-21 0306  LIPASE 17   No results found for this basename: AMMONIA,  in the last 168 hours CBC:  Recent Labs Lab 12/21/2012 0306  WBC 19.2*  NEUTROABS 16.7*  HGB 6.7*  HCT 19.5*  MCV 92.4  PLT 13*   Cardiac Enzymes:  Recent Labs Lab Dec 21, 2012 0306  TROPONINI <0.30    BNP (last 3 results)  Recent Labs  07/29/12 1207  PROBNP 656.4*   CBG: No results found for this basename: GLUCAP,  in the last 168 hours  Radiological Exams on Admission: Dg Abd Acute W/chest  12/21/2012  *RADIOLOGY REPORT*  Clinical Data: Altered mental status, fatigue, nausea and vomiting.  ACUTE ABDOMEN SERIES (ABDOMEN 2 VIEW & CHEST 1 VIEW)  Comparison: Abdominal radiograph performed 10/28/2012, and chest radiograph performed 10/22/2012  Findings: The lungs are well-aerated.  Mild left basilar opacity likely reflects atelectasis.  There is no evidence of pleural effusion or pneumothorax.  The cardiomediastinal silhouette is borderline normal in size.  The patient is status post median sternotomy.  A right-sided chest port is noted ending at the distal SVC.  The visualized bowel gas pattern is nonspecific.  There is distension of a few small bowel loops to 4.4 cm in maximal diameter, with associated air-fluid levels on the  decubitus view, though air is still seen in the colon.  No free intra-abdominal air is identified on the provided decubitus view.  No acute osseous abnormalities are seen; the sacroiliac joints are unremarkable in appearance.  Clips are noted within the right upper quadrant, reflecting prior cholecystectomy.  Scattered clips are also noted along the pelvic sidewalls.  Diffuse vascular calcifications are seen.  IMPRESSION:  1.  Nonspecific bowel gas pattern.  Distension of a few small bowel loops to 4.4 cm in maximal diameter, with associated air-fluid levels.  However, air is still seen in the colon.  This may reflect partial or low grade small bowel obstruction. 2.  No free intra-abdominal air seen. 3.  Mild left basilar opacity likely reflects atelectasis; lungs otherwise clear. 4.  Diffuse vascular calcifications seen.   Original Report Authenticated By: Tonia Ghent, M.D.     EKG: Independently reviewed. Sinus rhythm without any acute changes  Assessment/Plan Active Problems:   DIABETES MELLITUS, TYPE II   Atrial fibrillation   Hypothyroid   Poorly differentiated lung carcinoma, favoring extensive stage small cell lung cancer - Nov 2013, ECOG 1   Chemotherapy induced thrombocytopenia   Anemia associated with chemotherapy   Leukocytosis   Partial small bowel obstruction   Generalized weakness   Dehydration   Hypoalbuminemia   1. Anemia. Likely related to effects of recent chemotherapy. 2 units PRBCs and already been ordered by ED physician. We'll transfuse and follow hemoglobins. 2. Thrombocytopenia. Per family patient does not have any evidence of bleeding. We will continue to follow for now. Transfuse platelets for platelet count less than 10,000. 3. Leukocytosis. Patient's family reports that he had received a Neulasta injection approximately 3 days ago. This is likely secondary to Neulasta. Blood cultures and urine culture has been sent. He is not febrile. We will hold off on starting  antibiotics for now. 4. Dehydration. Likely due to poor by mouth intake from effects of chemotherapy. Will give gentle hydration. 5. Hypoalbuminemia. We will continue nutritional supplements, mainly nutritional consult. 6. Small cell lung cancer, extensive stage. We will inform Dr. Arbutus Ped of patient's admission. 7. Partial small bowel obstruction.  X-ray imaging indicates a possible partial small bowel obstruction. Patient is having bowel movements, passing flatus. He does have nausea but has not had significant vomiting. This may be more of an ileus. We'll continue on clear liquids and advance as tolerated. He does not need an NG tube present. 8. Atrial fibrillation. Currently in sinus rhythm. Continue to monitor on telemetry. Continue Lopressor.  Code Status: DO NOT RESUSCITATE, confirmed with patient sons Family Communication: Discussed with his sons at the bedside Disposition Plan: Pending hospital course  Time spent:  [facility] Pager [phone]  If 7PM-7AM, please contact night-coverage [email or web address] Password [facility] 11/29/2012, 6:22 AM

## 2012-12-22 DEATH — deceased

## 2013-02-08 ENCOUNTER — Ambulatory Visit: Payer: Medicare Other | Admitting: Radiation Oncology

## 2013-10-06 IMAGING — CR DG CHEST 2V
2 series · 2 of 2 positions shown · non-contrast
Comparison: 04/10/2012

CLINICAL DATA: Endobronchial ultrasound preoperative film

CHEST - 2 VIEW

[view not recorded (1 of 2)]
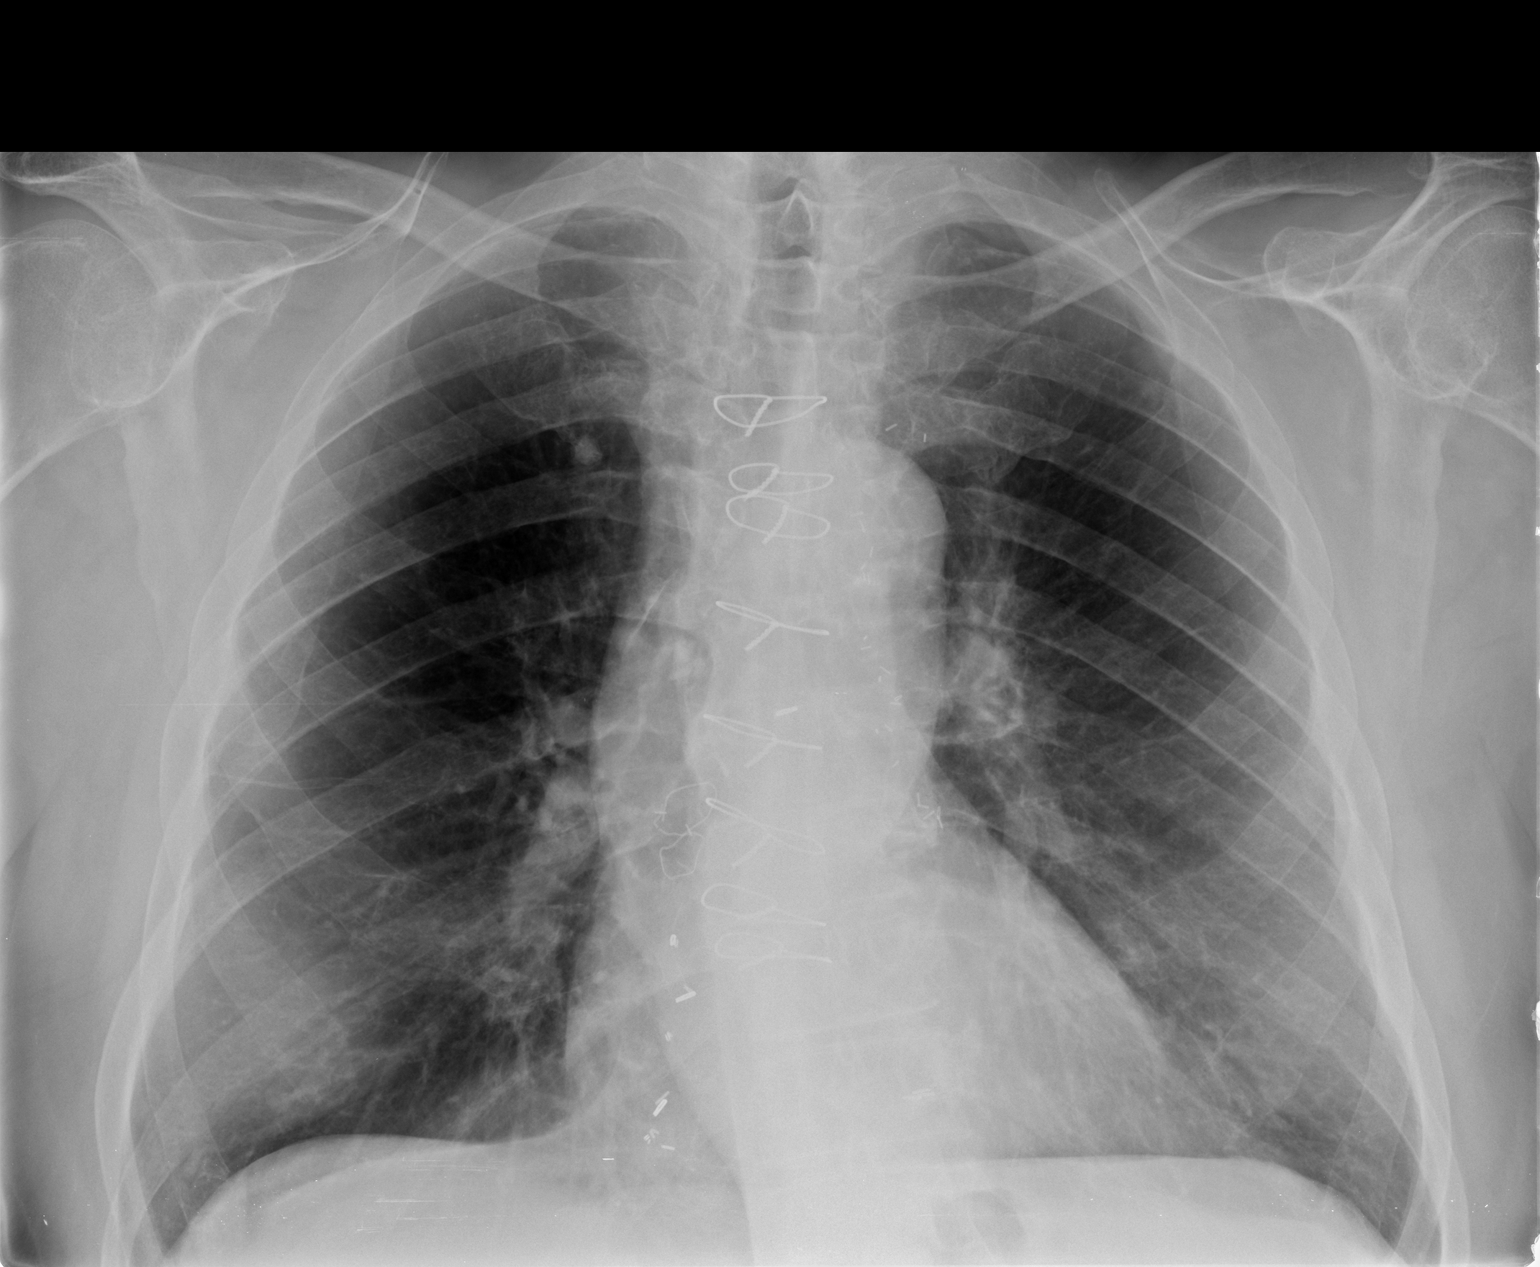

[view not recorded (2 of 2)]
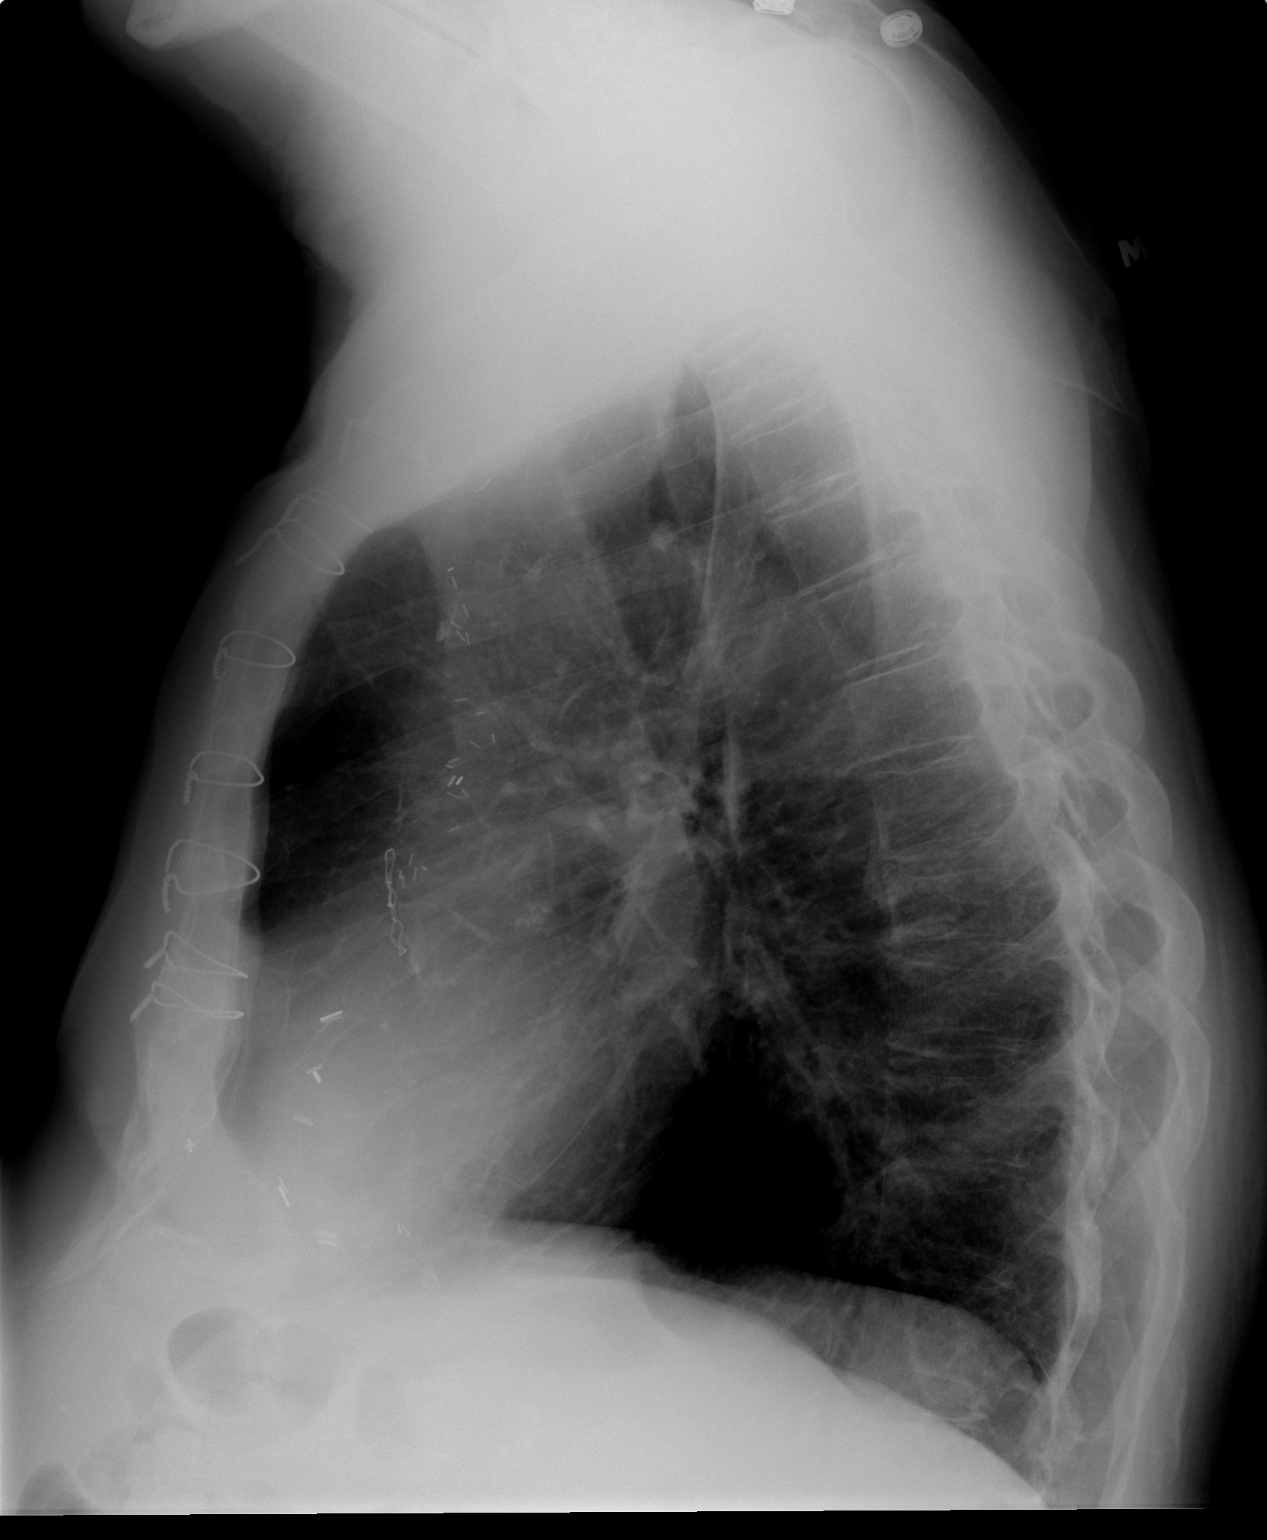

[2 of 2 positions shown; findings below may reference images not displayed]

FINDINGS: Hyperinflation indicates COPD.  The patient is status
post median sternotomy.  Heart size and vascular pattern are
normal.  Lungs are clear except for a stable calcified granuloma in
the medial right upper lobe measuring about 7 mm.
IMPRESSION: No acute findings.

## 2013-11-19 NOTE — Telephone Encounter (Signed)
Oral xarelto: Initial: 15 mg twice daily with food for 21 days followed by 20 mg once daily with food

## 2013-11-22 NOTE — Telephone Encounter (Signed)
Pt deceased. rx cancelled. Antares Bing, CMA

## 2014-05-19 ENCOUNTER — Other Ambulatory Visit: Payer: Self-pay | Admitting: *Deleted
